# Patient Record
Sex: Female | Born: 1955 | Race: White | Hispanic: No | Marital: Married | Smoking: Current every day smoker
Health system: Southern US, Community
[De-identification: ages and names within clinical notes are randomized; demographics above are authoritative.]

## PROBLEM LIST (undated history)

## (undated) DIAGNOSIS — F329 Major depressive disorder, single episode, unspecified: Secondary | ICD-10-CM

## (undated) DIAGNOSIS — I219 Acute myocardial infarction, unspecified: Secondary | ICD-10-CM

## (undated) DIAGNOSIS — F419 Anxiety disorder, unspecified: Secondary | ICD-10-CM

## (undated) DIAGNOSIS — I5032 Chronic diastolic (congestive) heart failure: Secondary | ICD-10-CM

## (undated) DIAGNOSIS — I480 Paroxysmal atrial fibrillation: Secondary | ICD-10-CM

## (undated) DIAGNOSIS — IMO0002 Reserved for concepts with insufficient information to code with codable children: Secondary | ICD-10-CM

## (undated) DIAGNOSIS — F32A Depression, unspecified: Secondary | ICD-10-CM

## (undated) DIAGNOSIS — IMO0001 Reserved for inherently not codable concepts without codable children: Secondary | ICD-10-CM

## (undated) DIAGNOSIS — I2510 Atherosclerotic heart disease of native coronary artery without angina pectoris: Secondary | ICD-10-CM

## (undated) DIAGNOSIS — K219 Gastro-esophageal reflux disease without esophagitis: Secondary | ICD-10-CM

## (undated) DIAGNOSIS — E785 Hyperlipidemia, unspecified: Secondary | ICD-10-CM

## (undated) HISTORY — PX: EYE SURGERY: SHX253

## (undated) HISTORY — PX: APPENDECTOMY: SHX54

## (undated) HISTORY — PX: CORONARY ARTERY BYPASS GRAFT: SHX141

## (undated) HISTORY — PX: OOPHORECTOMY: SHX86

## (undated) HISTORY — PX: ROTATOR CUFF REPAIR: SHX139

## (undated) HISTORY — PX: OTHER SURGICAL HISTORY: SHX169

## (undated) HISTORY — PX: BACK SURGERY: SHX140

---

## 2000-02-11 ENCOUNTER — Ambulatory Visit: Admission: RE | Admit: 2000-02-11 | Discharge: 2000-02-11 | Payer: Self-pay | Admitting: Endocrinology

## 2010-05-02 ENCOUNTER — Encounter: Payer: Self-pay | Admitting: Orthopedic Surgery

## 2010-05-07 ENCOUNTER — Inpatient Hospital Stay (HOSPITAL_COMMUNITY)
Admission: AD | Admit: 2010-05-07 | Discharge: 2010-05-09 | Payer: Self-pay | Source: Home / Self Care | Admitting: Orthopedic Surgery

## 2010-08-26 LAB — URINALYSIS, ROUTINE W REFLEX MICROSCOPIC
Glucose, UA: NEGATIVE mg/dL
Nitrite: NEGATIVE
Protein, ur: NEGATIVE mg/dL
Urobilinogen, UA: 1 mg/dL (ref 0.0–1.0)

## 2010-08-26 LAB — COMPREHENSIVE METABOLIC PANEL
AST: 16 U/L (ref 0–37)
Albumin: 3.3 g/dL — ABNORMAL LOW (ref 3.5–5.2)
CO2: 28 mEq/L (ref 19–32)
Calcium: 9.1 mg/dL (ref 8.4–10.5)
Creatinine, Ser: 0.87 mg/dL (ref 0.4–1.2)
GFR calc Af Amer: >60 mL/min (ref >60)
GFR calc non Af Amer: >60 mL/min (ref >60)

## 2010-08-26 LAB — GLUCOSE, CAPILLARY
Glucose-Capillary: 137 mg/dL — ABNORMAL HIGH (ref 70–99)
Glucose-Capillary: 139 mg/dL — ABNORMAL HIGH (ref 70–99)
Glucose-Capillary: 267 mg/dL — ABNORMAL HIGH (ref 70–99)
Glucose-Capillary: 86 mg/dL (ref 70–99)

## 2010-08-26 LAB — DIFFERENTIAL
Eosinophils Relative: 3 % (ref 0–5)
Lymphocytes Relative: 25 % (ref 12–46)
Lymphs Abs: 3.7 10*3/uL (ref 0.7–4.0)

## 2010-08-26 LAB — APTT: aPTT: 29 seconds (ref 24–37)

## 2010-08-26 LAB — PROTIME-INR: Prothrombin Time: 13.2 seconds (ref 11.6–15.2)

## 2010-08-26 LAB — CBC
MCH: 30.3 pg (ref 26.0–34.0)
MCHC: 34.1 g/dL (ref 30.0–36.0)
Platelets: 250 10*3/uL (ref 150–400)

## 2010-08-26 LAB — SURGICAL PCR SCREEN: Staphylococcus aureus: NEGATIVE

## 2011-01-14 HISTORY — PX: OTHER SURGICAL HISTORY: SHX169

## 2011-01-15 ENCOUNTER — Other Ambulatory Visit (HOSPITAL_COMMUNITY): Payer: Self-pay

## 2011-01-19 ENCOUNTER — Other Ambulatory Visit (HOSPITAL_COMMUNITY): Payer: Self-pay

## 2011-01-21 ENCOUNTER — Ambulatory Visit (HOSPITAL_COMMUNITY)
Admission: RE | Admit: 2011-01-21 | Payer: Worker's Compensation | Source: Ambulatory Visit | Admitting: Orthopedic Surgery

## 2011-03-16 DIAGNOSIS — I2510 Atherosclerotic heart disease of native coronary artery without angina pectoris: Secondary | ICD-10-CM

## 2011-03-16 DIAGNOSIS — I219 Acute myocardial infarction, unspecified: Secondary | ICD-10-CM

## 2011-03-16 HISTORY — DX: Atherosclerotic heart disease of native coronary artery without angina pectoris: I25.10

## 2011-03-16 HISTORY — DX: Acute myocardial infarction, unspecified: I21.9

## 2011-03-16 HISTORY — PX: CARDIAC CATHETERIZATION: SHX172

## 2011-04-04 DIAGNOSIS — I2510 Atherosclerotic heart disease of native coronary artery without angina pectoris: Secondary | ICD-10-CM

## 2011-04-06 DIAGNOSIS — I2510 Atherosclerotic heart disease of native coronary artery without angina pectoris: Secondary | ICD-10-CM

## 2011-06-23 ENCOUNTER — Encounter (HOSPITAL_COMMUNITY): Payer: Self-pay | Admitting: Pharmacy Technician

## 2011-06-24 ENCOUNTER — Encounter (HOSPITAL_COMMUNITY): Payer: Self-pay

## 2011-06-24 ENCOUNTER — Encounter (HOSPITAL_COMMUNITY)
Admission: RE | Admit: 2011-06-24 | Discharge: 2011-06-24 | Disposition: A | Discharge status: Home / Self Care | Payer: Worker's Compensation | Source: Ambulatory Visit | Attending: Orthopedic Surgery | Admitting: Orthopedic Surgery

## 2011-06-24 ENCOUNTER — Ambulatory Visit (HOSPITAL_COMMUNITY)
Admission: RE | Admit: 2011-06-24 | Discharge: 2011-06-24 | Disposition: A | Discharge status: Home / Self Care | Payer: Worker's Compensation | Source: Ambulatory Visit | Attending: Surgical | Admitting: Surgical

## 2011-06-24 HISTORY — DX: Reserved for concepts with insufficient information to code with codable children: IMO0002

## 2011-06-24 HISTORY — DX: Major depressive disorder, single episode, unspecified: F32.9

## 2011-06-24 HISTORY — DX: Gastro-esophageal reflux disease without esophagitis: K21.9

## 2011-06-24 HISTORY — DX: Acute myocardial infarction, unspecified: I21.9

## 2011-06-24 HISTORY — DX: Anxiety disorder, unspecified: F41.9

## 2011-06-24 HISTORY — DX: Reserved for inherently not codable concepts without codable children: IMO0001

## 2011-06-24 HISTORY — DX: Depression, unspecified: F32.A

## 2011-06-24 LAB — COMPREHENSIVE METABOLIC PANEL
ALT: 20 U/L (ref 0–35)
AST: 17 U/L (ref 0–37)
Albumin: 3.1 g/dL — ABNORMAL LOW (ref 3.5–5.2)
Alkaline Phosphatase: 125 U/L — ABNORMAL HIGH (ref 39–117)
BUN: 13 mg/dL (ref 6–23)
CO2: 27 mEq/L (ref 19–32)
Calcium: 9.2 mg/dL (ref 8.4–10.5)
Chloride: 98 mEq/L (ref 96–112)
Creatinine, Ser: 0.74 mg/dL (ref 0.50–1.10)
GFR calc Af Amer: >90 mL/min (ref >90)
GFR calc non Af Amer: >90 mL/min (ref >90)
Glucose, Bld: 376 mg/dL — ABNORMAL HIGH (ref 70–99)
Potassium: 4.5 mEq/L (ref 3.5–5.1)
Sodium: 132 mEq/L — ABNORMAL LOW (ref 135–145)
Total Bilirubin: 0.3 mg/dL (ref 0.3–1.2)
Total Protein: 7.1 g/dL (ref 6.0–8.3)

## 2011-06-24 LAB — URINALYSIS, ROUTINE W REFLEX MICROSCOPIC
Bilirubin Urine: NEGATIVE
Glucose, UA: >1000 mg/dL — AB
Hgb urine dipstick: NEGATIVE
Ketones, ur: NEGATIVE mg/dL
Leukocytes, UA: NEGATIVE
Nitrite: NEGATIVE
Protein, ur: NEGATIVE mg/dL
Specific Gravity, Urine: 1.027 (ref 1.005–1.030)
Urobilinogen, UA: 0.2 mg/dL (ref 0.0–1.0)
pH: 5.5 (ref 5.0–8.0)

## 2011-06-24 LAB — URINE MICROSCOPIC-ADD ON

## 2011-06-24 LAB — CBC
HCT: 35.1 % — ABNORMAL LOW (ref 36.0–46.0)
Hemoglobin: 11.1 g/dL — ABNORMAL LOW (ref 12.0–15.0)
MCH: 26.1 pg (ref 26.0–34.0)
MCHC: 31.6 g/dL (ref 30.0–36.0)
MCV: 82.6 fL (ref 78.0–100.0)
Platelets: 337 10*3/uL (ref 150–400)
RBC: 4.25 MIL/uL (ref 3.87–5.11)
RDW: 15.8 % — ABNORMAL HIGH (ref 11.5–15.5)
WBC: 12.3 10*3/uL — ABNORMAL HIGH (ref 4.0–10.5)

## 2011-06-24 LAB — DIFFERENTIAL
Basophils Absolute: 0.1 10*3/uL (ref 0.0–0.1)
Basophils Relative: 1 % (ref 0–1)
Eosinophils Absolute: 1 10*3/uL — ABNORMAL HIGH (ref 0.0–0.7)
Eosinophils Relative: 8 % — ABNORMAL HIGH (ref 0–5)
Lymphocytes Relative: 26 % (ref 12–46)
Lymphs Abs: 3.2 10*3/uL (ref 0.7–4.0)
Monocytes Absolute: 0.7 10*3/uL (ref 0.1–1.0)
Monocytes Relative: 6 % (ref 3–12)
Neutro Abs: 7.3 10*3/uL (ref 1.7–7.7)
Neutrophils Relative %: 59 % (ref 43–77)

## 2011-06-24 LAB — ABO/RH: ABO/RH(D): O NEG

## 2011-06-24 LAB — TYPE AND SCREEN
ABO/RH(D): O NEG
Antibody Screen: NEGATIVE

## 2011-06-24 LAB — SURGICAL PCR SCREEN: Staphylococcus aureus: NEGATIVE

## 2011-06-24 LAB — PROTIME-INR
INR: 1.08 (ref 0.00–1.49)
Prothrombin Time: 14.2 seconds (ref 11.6–15.2)

## 2011-06-24 LAB — APTT: aPTT: 33 seconds (ref 24–37)

## 2011-06-24 NOTE — Patient Instructions (Addendum)
20 ELEXIA FRIEDT  06/24/2011   Your procedure is scheduled on:  06/25/11  Report to [facility] at 1:45 pm  Call this number if you have problems the morning of surgery: [phone]   Remember:   Do not eat food:After Midnight.  May have clear liquids: up to 4 Hours before arrival. (9:45 am)  Clear liquids include soda, tea, black coffee, apple or grape juice, broth.  Take these medicines the morning of surgery with A SIP OF WATER: xanax / wellbutrin / coreg / prozac / neurontin / protonix / norco if needed / take 1/2 dose of lantus insulin the night before surgery   Do not wear jewelry, make-up or nail polish.  Do not wear lotions, powders, or perfumes. You may wear deodorant.  Do not shave 48 hours prior to surgery.  Do not bring valuables to the hospital.  Contacts, dentures or bridgework may not be worn into surgery.  Leave suitcase in the car. After surgery it may be brought to your room.  For patients admitted to the hospital, checkout time is 11:00 AM the day of discharge.   Patients discharged the day of surgery will not be allowed to drive home.  Name and phone number of your driver:   Special Instructions: CHG Shower Use Special Wash: 1/2 bottle night before surgery and 1/2 bottle morning of surgery.   Please read over the following fact sheets that you were given: MRSA Information

## 2011-06-24 NOTE — H&P (Signed)
Micheal Likens DOB: 1955-08-04  Chief Complaint: Back pain  History of Present Illness The patient is a 56 year old female who comes in today for a preoperative History and Physical. The patient is scheduled for a right Lumbar Hemilaminectomy and microdiscectomy L5-S1 to be performed by Dr. Georges Lynch. Darrelyn Hillock, MD at [facility] on Thursday June 25, 2011 . 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 She has been cleared for surgery by Dr. Josiah Lobo at [facility]. Patient has right lower back pain radiating into the right leg, with numbness and tingling. She had an injury in July 2011, when she fell down the stairs at work. MRI showed mass effect at S1, right, and severe disc degeneration at L5-S1. She has had several medical problems since then that have postponed surgery.   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 02/04/2010 Pain in joint, shoulder (719.41). 02/04/2010 Lumbago (724.2). 02/04/2010 Sprain/strain, shoulder/arm NOS (840.9). 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 02/04/2010 Pain in joint, shoulder (719.41). 03/03/2010 Sprain/strain, shoulder/arm NEC (840.8). 09/02/2010 Depression Hypertension Diabetes Mellitus, Type II Anxiety Disorder Coronary Artery Disease Coronary Artery Bypass, Four Myocardial infarction  Allergies CODEINE (not the derivatives) SULFA TRAMADOL. 10/30/2010 SENSITIVITY  Family History Heart Disease Cancer  Social History Tobacco use. stopped smoking October 2012 Alcohol use. Occasional alcohol use.  Past Surgical History Hysterectomy (not due to cancer) - Partial Rotator Cuff Repair - Both Heart Stents Low Back Disc Surgery Coronary Artery Bypass, Four   Review of Systems General:Not Present- Chills, Fever, Night Sweats, Fatigue, Weight Gain, Weight  Loss and Memory Loss. Skin:Not Present- Hives, Itching, Rash, Eczema and Lesions. HEENT:Not Present- Tinnitus, Headache, Double Vision, Visual Loss, Hearing Loss and Dentures. Respiratory:Not Present- Shortness of breath with exertion, Shortness of breath at rest, Allergies, Coughing up blood and Chronic Cough. Cardiovascular:Not Present- Chest Pain, Racing/skipping heartbeats, Difficulty Breathing Lying Down, Murmur, Swelling and Palpitations. Gastrointestinal:Not Present- Bloody Stool, Heartburn, Abdominal Pain, Vomiting, Nausea, Constipation, Diarrhea, Difficulty Swallowing, Jaundice and Loss of appetitie. Female Genitourinary:Not Present- Blood in Urine, Urinary frequency, Weak urinary stream, Discharge, Flank Pain, Incontinence, Painful Urination, Urgency, Urinary Retention and Urinating at Night. Musculoskeletal:Present- Muscle Weakness, Muscle Pain and Back Pain. Not Present- Joint Swelling, Joint Pain, Morning Stiffness and Spasms. Neurological:Present- Tingling and Numbness. Not Present- Tremor, Dizziness, Blackout spells, Paralysis, Difficulty with balance and Weakness. Psychiatric:Not Present- Insomnia.   Vitals 06/23/2011 1:35 PM Weight: 220 lb Height: 64 in Body Surface Area: 2.12 m Body Mass Index: 37.76 kg/m Pulse: 105 (Regular) Resp.: 18 (Unlabored) BP: 147/84 (Sitting, Left Arm, Standard)  Physical Exam  General Mental Status - Alert, cooperative and good historian. General Appearance- pleasant. Not in acute distress. Orientation- Oriented X3. Build & Nutrition- Well nourished and Well developed. Integumentary Incision scar over the sternum from her CABG in Oct 2012. Head and Neck Head- normocephalic, atraumatic . NeckGlobal Assessment- supple. no bruit auscultated on the right and no bruit auscultated on the left. Eye Pupil- Bilateral- PERRLA. Motion- Bilateral- EOMI. Chest and Lung Exam Auscultation: Breath sounds:- clear at  anterior chest wall and - clear at posterior chest wall. Adventitious sounds:- No Adventitious sounds. Cardiovascular Auscultation:Rhythm- Regular rate and rhythm. Heart Sounds- S1 WNL and S2 WNL. Murmurs & Other Heart Sounds:Auscultation of the heart reveals - No Murmurs. Abdomen Palpation/Percussion:Tenderness- Abdomen is non-tender to palpation. Rigidity (guarding)- Abdomen is soft. Auscultation:Auscultation of the abdomen reveals - Bowel sounds normal. Female  Genitourinary Not done, not pertinent to present illness Peripheral Vascular Lower Extremity: Palpation:- Pulses bilaterally normal. Neurologic Cranial Nerves:- Normal Bilaterally. Sensory:- Normal. Overall Assessment of Muscle Strength and Tone reveals: Lower Extremities:Right Quadriceps- 3/5. Left Quadriceps- 5/5. General Assessment of Reflexes: Right Knee- 2+. Left Knee- 2+. Partial foot drop right side. Straight leg raise positive on right. Negative on left. Musculoskeletal Hips and knees have full painless range of motion.    Radiographs Previous MRI performed on 12/22/2010 shows L5-S1 severe disc degeneration with loss of disc height, moderate disc bulging and left laminotomy. Mild mass effect on the S1 roots right more than left.  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 We do use antibiotics prior to the surgery and during surgery and after surgery. Number two is always a slight degree of probability that you could develop a blood clot in your leg after any type of surgery and we try our best to prevent that with aspirin post op when it is safe to begin. The third is a dural leak. That is the spinal fluid leak that could occur. At certain rare times the bone or the disc could literally stick to the dura which is the lining  which contains the spinal fluid and we could develop a small tear in that lining which we then patch up. That is an extremely rare complication. The last and final complication is a recurrent disc rupture. That means that you could rupture another small piece of disc later on down the road and there is about a 2% chance of that.  Dimitri Ped, PA-C

## 2011-06-25 ENCOUNTER — Inpatient Hospital Stay (HOSPITAL_COMMUNITY): Payer: Worker's Compensation

## 2011-06-25 ENCOUNTER — Inpatient Hospital Stay (HOSPITAL_COMMUNITY): Payer: Worker's Compensation | Admitting: Anesthesiology

## 2011-06-25 ENCOUNTER — Encounter (HOSPITAL_COMMUNITY): Payer: Self-pay | Admitting: Anesthesiology

## 2011-06-25 ENCOUNTER — Encounter (HOSPITAL_COMMUNITY)
Admission: RE | Disposition: A | Discharge status: Home / Self Care | Payer: Self-pay | Source: Ambulatory Visit | Attending: Orthopedic Surgery

## 2011-06-25 ENCOUNTER — Inpatient Hospital Stay (HOSPITAL_COMMUNITY)
Admission: RE | Admit: 2011-06-25 | Discharge: 2011-06-28 | DRG: 491 | Disposition: A | Discharge status: Home / Self Care | Payer: Worker's Compensation | Source: Ambulatory Visit | Attending: Orthopedic Surgery | Admitting: Orthopedic Surgery

## 2011-06-25 DIAGNOSIS — M48061 Spinal stenosis, lumbar region without neurogenic claudication: Principal | ICD-10-CM | POA: Diagnosis present

## 2011-06-25 DIAGNOSIS — M4807 Spinal stenosis, lumbosacral region: Secondary | ICD-10-CM | POA: Diagnosis present

## 2011-06-25 DIAGNOSIS — M5126 Other intervertebral disc displacement, lumbar region: Secondary | ICD-10-CM | POA: Diagnosis present

## 2011-06-25 DIAGNOSIS — F411 Generalized anxiety disorder: Secondary | ICD-10-CM | POA: Diagnosis present

## 2011-06-25 DIAGNOSIS — Z951 Presence of aortocoronary bypass graft: Secondary | ICD-10-CM

## 2011-06-25 DIAGNOSIS — I252 Old myocardial infarction: Secondary | ICD-10-CM

## 2011-06-25 DIAGNOSIS — Z87891 Personal history of nicotine dependence: Secondary | ICD-10-CM

## 2011-06-25 DIAGNOSIS — F329 Major depressive disorder, single episode, unspecified: Secondary | ICD-10-CM | POA: Diagnosis present

## 2011-06-25 DIAGNOSIS — I10 Essential (primary) hypertension: Secondary | ICD-10-CM | POA: Diagnosis present

## 2011-06-25 DIAGNOSIS — F3289 Other specified depressive episodes: Secondary | ICD-10-CM | POA: Diagnosis present

## 2011-06-25 DIAGNOSIS — I2510 Atherosclerotic heart disease of native coronary artery without angina pectoris: Secondary | ICD-10-CM | POA: Diagnosis present

## 2011-06-25 DIAGNOSIS — E119 Type 2 diabetes mellitus without complications: Secondary | ICD-10-CM | POA: Diagnosis present

## 2011-06-25 HISTORY — PX: LUMBAR LAMINECTOMY/DECOMPRESSION MICRODISCECTOMY: SHX5026

## 2011-06-25 LAB — GLUCOSE, CAPILLARY
Glucose-Capillary: 171 mg/dL — ABNORMAL HIGH (ref 70–99)
Glucose-Capillary: 132 mg/dL — ABNORMAL HIGH (ref 70–99)
Glucose-Capillary: 126 mg/dL — ABNORMAL HIGH (ref 70–99)

## 2011-06-25 SURGERY — LUMBAR LAMINECTOMY/DECOMPRESSION MICRODISCECTOMY
Anesthesia: General | Site: Back | Laterality: Right | Wound class: Clean

## 2011-06-25 MED ORDER — CARVEDILOL 3.125 MG PO TABS
3.1250 mg | ORAL_TABLET | Freq: Two times a day (BID) | ORAL | Status: AC
  Administered 2011-06-25: 3.125 mg via ORAL
  Filled 2011-06-25: qty 1

## 2011-06-25 MED ORDER — LACTATED RINGERS IV SOLN
INTRAVENOUS | Status: DC
  Administered 2011-06-25 – 2011-06-28 (×5): via INTRAVENOUS

## 2011-06-25 MED ORDER — PANTOPRAZOLE SODIUM 20 MG PO TBEC
20.0000 mg | DELAYED_RELEASE_TABLET | Freq: Every day | ORAL | Status: DC
  Administered 2011-06-25 – 2011-06-28 (×4): 20 mg via ORAL
  Filled 2011-06-25 (×4): qty 1

## 2011-06-25 MED ORDER — PRASUGREL HCL 10 MG PO TABS
10.0000 mg | ORAL_TABLET | Freq: Every day | ORAL | Status: DC
  Administered 2011-06-26 – 2011-06-28 (×3): 10 mg via ORAL
  Filled 2011-06-25 (×3): qty 1

## 2011-06-25 MED ORDER — METHOCARBAMOL 500 MG PO TABS
500.0000 mg | ORAL_TABLET | Freq: Four times a day (QID) | ORAL | Status: DC | PRN
  Administered 2011-06-26 – 2011-06-28 (×5): 500 mg via ORAL
  Filled 2011-06-25 (×5): qty 1

## 2011-06-25 MED ORDER — BACITRACIN-NEOMYCIN-POLYMYXIN 400-5-5000 EX OINT
TOPICAL_OINTMENT | CUTANEOUS | Status: DC | PRN
  Administered 2011-06-25: 1 via TOPICAL

## 2011-06-25 MED ORDER — PROMETHAZINE HCL 25 MG/ML IJ SOLN: 6.2500 mg | INTRAMUSCULAR | Status: DC | PRN

## 2011-06-25 MED ORDER — PHENYLEPHRINE HCL 10 MG/ML IJ SOLN
10.0000 mg | INTRAVENOUS | Status: DC | PRN
  Administered 2011-06-25: 50 ug/min via INTRAVENOUS

## 2011-06-25 MED ORDER — THROMBIN 5000 UNITS EX SOLR
CUTANEOUS | Status: AC
  Filled 2011-06-25: qty 10000

## 2011-06-25 MED ORDER — PHENOL 1.4 % MT LIQD
1.0000 | OROMUCOSAL | Status: DC | PRN
  Filled 2011-06-25: qty 177

## 2011-06-25 MED ORDER — CISATRACURIUM BESYLATE 2 MG/ML IV SOLN
INTRAVENOUS | Status: DC | PRN
  Administered 2011-06-25: 10 mg via INTRAVENOUS
  Administered 2011-06-25: 3 mg via INTRAVENOUS
  Administered 2011-06-25: 2 mg via INTRAVENOUS

## 2011-06-25 MED ORDER — INSULIN ASPART 100 UNIT/ML ~~LOC~~ SOLN
0.0000 [IU] | Freq: Three times a day (TID) | SUBCUTANEOUS | Status: DC
  Administered 2011-06-26: 3 [IU] via SUBCUTANEOUS
  Administered 2011-06-26: 5 [IU] via SUBCUTANEOUS
  Administered 2011-06-27: 2 [IU] via SUBCUTANEOUS
  Administered 2011-06-27 – 2011-06-28 (×3): 3 [IU] via SUBCUTANEOUS
  Filled 2011-06-25: qty 3

## 2011-06-25 MED ORDER — SUFENTANIL CITRATE 50 MCG/ML IV SOLN
INTRAVENOUS | Status: DC | PRN
  Administered 2011-06-25: 15 ug via INTRAVENOUS
  Administered 2011-06-25: 5 ug via INTRAVENOUS
  Administered 2011-06-25 (×3): 10 ug via INTRAVENOUS

## 2011-06-25 MED ORDER — NITROGLYCERIN 0.4 MG SL SUBL: 0.4000 mg | SUBLINGUAL_TABLET | SUBLINGUAL | Status: DC | PRN

## 2011-06-25 MED ORDER — FLUOXETINE HCL 20 MG PO CAPS
40.0000 mg | ORAL_CAPSULE | Freq: Two times a day (BID) | ORAL | Status: DC
  Administered 2011-06-25 – 2011-06-28 (×6): 40 mg via ORAL
  Filled 2011-06-25 (×7): qty 2

## 2011-06-25 MED ORDER — CEFAZOLIN SODIUM-DEXTROSE 2-3 GM-% IV SOLR
2.0000 g | Freq: Once | INTRAVENOUS | Status: AC
  Administered 2011-06-25: 2 g via INTRAVENOUS

## 2011-06-25 MED ORDER — LACTATED RINGERS IV SOLN
INTRAVENOUS | Status: DC
  Administered 2011-06-25 (×2): via INTRAVENOUS

## 2011-06-25 MED ORDER — ACETAMINOPHEN 10 MG/ML IV SOLN
INTRAVENOUS | Status: AC
  Filled 2011-06-25: qty 100

## 2011-06-25 MED ORDER — GLIPIZIDE 5 MG PO TABS
5.0000 mg | ORAL_TABLET | Freq: Two times a day (BID) | ORAL | Status: DC
  Administered 2011-06-26 – 2011-06-28 (×5): 5 mg via ORAL
  Filled 2011-06-25 (×6): qty 1

## 2011-06-25 MED ORDER — BACITRACIN ZINC 500 UNIT/GM EX OINT
TOPICAL_OINTMENT | CUTANEOUS | Status: AC
  Filled 2011-06-25: qty 15

## 2011-06-25 MED ORDER — BUPIVACAINE LIPOSOME 1.3 % IJ SUSP
INTRAMUSCULAR | Status: DC | PRN
  Administered 2011-06-25: 20 mL

## 2011-06-25 MED ORDER — MIDAZOLAM HCL 5 MG/5ML IJ SOLN
INTRAMUSCULAR | Status: DC | PRN
  Administered 2011-06-25: 1 mg via INTRAVENOUS

## 2011-06-25 MED ORDER — CEFAZOLIN SODIUM-DEXTROSE 2-3 GM-% IV SOLR
INTRAVENOUS | Status: AC
  Filled 2011-06-25: qty 50

## 2011-06-25 MED ORDER — GLIPIZIDE-METFORMIN HCL 5-500 MG PO TABS: 1.0000 | ORAL_TABLET | Freq: Two times a day (BID) | ORAL | Status: DC

## 2011-06-25 MED ORDER — METFORMIN HCL 500 MG PO TABS
500.0000 mg | ORAL_TABLET | Freq: Two times a day (BID) | ORAL | Status: DC
  Administered 2011-06-26 – 2011-06-28 (×5): 500 mg via ORAL
  Filled 2011-06-25 (×6): qty 1

## 2011-06-25 MED ORDER — BUPIVACAINE LIPOSOME 1.3 % IJ SUSP
20.0000 mL | Freq: Once | INTRAMUSCULAR | Status: DC
  Filled 2011-06-25: qty 20

## 2011-06-25 MED ORDER — BUPROPION HCL ER (SR) 150 MG PO TB12
150.0000 mg | ORAL_TABLET | Freq: Two times a day (BID) | ORAL | Status: DC
  Administered 2011-06-25 – 2011-06-28 (×6): 150 mg via ORAL
  Filled 2011-06-25 (×7): qty 1

## 2011-06-25 MED ORDER — ACETAMINOPHEN 10 MG/ML IV SOLN
INTRAVENOUS | Status: DC | PRN
  Administered 2011-06-25: 1000 mg via INTRAVENOUS

## 2011-06-25 MED ORDER — LIDOCAINE HCL (CARDIAC) 20 MG/ML IV SOLN
INTRAVENOUS | Status: DC | PRN
  Administered 2011-06-25: 75 mg via INTRAVENOUS

## 2011-06-25 MED ORDER — ONDANSETRON HCL 4 MG/2ML IJ SOLN: 4.0000 mg | INTRAMUSCULAR | Status: DC | PRN

## 2011-06-25 MED ORDER — LACTATED RINGERS IV SOLN: INTRAVENOUS | Status: DC

## 2011-06-25 MED ORDER — PROPOFOL 10 MG/ML IV EMUL
INTRAVENOUS | Status: DC | PRN
  Administered 2011-06-25: 130 mg via INTRAVENOUS

## 2011-06-25 MED ORDER — NEOSTIGMINE METHYLSULFATE 1 MG/ML IJ SOLN
INTRAMUSCULAR | Status: DC | PRN
  Administered 2011-06-25: 4 mg via INTRAVENOUS

## 2011-06-25 MED ORDER — GLYCOPYRROLATE 0.2 MG/ML IJ SOLN
INTRAMUSCULAR | Status: DC | PRN
  Administered 2011-06-25: .6 mg via INTRAVENOUS

## 2011-06-25 MED ORDER — THROMBIN 5000 UNITS EX SOLR
CUTANEOUS | Status: DC | PRN
  Administered 2011-06-25: 5000 [IU] via TOPICAL

## 2011-06-25 MED ORDER — HYDROMORPHONE HCL PF 1 MG/ML IJ SOLN
0.5000 mg | INTRAMUSCULAR | Status: DC | PRN
  Administered 2011-06-25 – 2011-06-26 (×7): 1 mg via INTRAVENOUS
  Administered 2011-06-26 – 2011-06-27 (×2): 0.5 mg via INTRAVENOUS
  Filled 2011-06-25 (×10): qty 1

## 2011-06-25 MED ORDER — MENTHOL 3 MG MT LOZG
1.0000 | LOZENGE | OROMUCOSAL | Status: DC | PRN
  Filled 2011-06-25: qty 9

## 2011-06-25 MED ORDER — SIMVASTATIN 20 MG PO TABS
20.0000 mg | ORAL_TABLET | Freq: Every day | ORAL | Status: DC
  Administered 2011-06-25 – 2011-06-28 (×4): 20 mg via ORAL
  Filled 2011-06-25 (×4): qty 1

## 2011-06-25 MED ORDER — ONDANSETRON HCL 4 MG/2ML IJ SOLN
INTRAMUSCULAR | Status: DC | PRN
  Administered 2011-06-25: 4 mg via INTRAVENOUS

## 2011-06-25 MED ORDER — HYDROMORPHONE HCL 2 MG PO TABS
2.0000 mg | ORAL_TABLET | ORAL | Status: DC | PRN
  Administered 2011-06-26 – 2011-06-28 (×10): 2 mg via ORAL
  Filled 2011-06-25 (×10): qty 1

## 2011-06-25 MED ORDER — METHOCARBAMOL 100 MG/ML IJ SOLN
500.0000 mg | Freq: Four times a day (QID) | INTRAVENOUS | Status: DC | PRN
  Administered 2011-06-25: 500 mg via INTRAVENOUS
  Filled 2011-06-25: qty 5

## 2011-06-25 MED ORDER — CEFAZOLIN SODIUM 1-5 GM-% IV SOLN
1.0000 g | Freq: Three times a day (TID) | INTRAVENOUS | Status: AC
  Administered 2011-06-25 – 2011-06-26 (×3): 1 g via INTRAVENOUS
  Filled 2011-06-25 (×4): qty 50

## 2011-06-25 MED ORDER — ACETAMINOPHEN 10 MG/ML IV SOLN
1000.0000 mg | Freq: Four times a day (QID) | INTRAVENOUS | Status: AC
  Administered 2011-06-25 – 2011-06-26 (×3): 1000 mg via INTRAVENOUS
  Filled 2011-06-25 (×4): qty 100

## 2011-06-25 MED ORDER — SODIUM CHLORIDE 0.9 % IR SOLN
Status: DC | PRN
  Administered 2011-06-25: 17:00:00

## 2011-06-25 MED ORDER — ALPRAZOLAM 1 MG PO TABS
1.0000 mg | ORAL_TABLET | Freq: Two times a day (BID) | ORAL | Status: DC | PRN
  Administered 2011-06-27: 1 mg via ORAL
  Filled 2011-06-25: qty 1

## 2011-06-25 MED ORDER — FENTANYL CITRATE 0.05 MG/ML IJ SOLN
25.0000 ug | INTRAMUSCULAR | Status: DC | PRN
  Administered 2011-06-25: 50 ug via INTRAVENOUS
  Administered 2011-06-25 (×2): 25 ug via INTRAVENOUS

## 2011-06-25 MED ORDER — GABAPENTIN 300 MG PO CAPS
300.0000 mg | ORAL_CAPSULE | Freq: Three times a day (TID) | ORAL | Status: DC
  Administered 2011-06-25 – 2011-06-28 (×8): 300 mg via ORAL
  Filled 2011-06-25 (×10): qty 1

## 2011-06-25 MED ORDER — CARVEDILOL 3.125 MG PO TABS
3.1250 mg | ORAL_TABLET | Freq: Two times a day (BID) | ORAL | Status: DC
  Administered 2011-06-26 – 2011-06-28 (×5): 3.125 mg via ORAL
  Filled 2011-06-25 (×6): qty 1

## 2011-06-25 SURGICAL SUPPLY — 46 items
APL SKNCLS STERI-STRIP NONHPOA (GAUZE/BANDAGES/DRESSINGS) ×1
BAG SPEC THK2 15X12 ZIP CLS (MISCELLANEOUS) ×1
BAG ZIPLOCK 12X15 (MISCELLANEOUS) ×2 IMPLANT
BENZOIN TINCTURE PRP APPL 2/3 (GAUZE/BANDAGES/DRESSINGS) ×2 IMPLANT
CLEANER TIP ELECTROSURG 2X2 (MISCELLANEOUS) ×2 IMPLANT
CLOTH BEACON ORANGE TIMEOUT ST (SAFETY) ×2 IMPLANT
CONT SPECI 4OZ STER CLIK (MISCELLANEOUS) ×2 IMPLANT
DRAIN PENROSE 18X1/4 LTX STRL (WOUND CARE) IMPLANT
DRAPE MICROSCOPE LEICA (MISCELLANEOUS) ×2 IMPLANT
DRAPE POUCH INSTRU U-SHP 10X18 (DRAPES) ×2 IMPLANT
DRAPE SURG 17X11 SM STRL (DRAPES) ×2 IMPLANT
DRSG ADAPTIC 3X8 NADH LF (GAUZE/BANDAGES/DRESSINGS) ×2 IMPLANT
DRSG EMULSION OIL 3X16 NADH (GAUZE/BANDAGES/DRESSINGS) ×1 IMPLANT
DRSG PAD ABDOMINAL 8X10 ST (GAUZE/BANDAGES/DRESSINGS) ×2 IMPLANT
DURAPREP 26ML APPLICATOR (WOUND CARE) ×2 IMPLANT
ELECT REM PT RETURN 9FT ADLT (ELECTROSURGICAL) ×2
ELECTRODE REM PT RTRN 9FT ADLT (ELECTROSURGICAL) ×1 IMPLANT
GAUZE SPONGE 4X4 12PLY STRL LF (GAUZE/BANDAGES/DRESSINGS) ×1 IMPLANT
GLOVE BIOGEL PI IND STRL 8.5 (GLOVE) ×1 IMPLANT
GLOVE BIOGEL PI INDICATOR 8.5 (GLOVE) ×1
GLOVE ECLIPSE 8.0 STRL XLNG CF (GLOVE) ×2 IMPLANT
GOWN PREVENTION PLUS LG XLONG (DISPOSABLE) ×4 IMPLANT
GOWN STRL REIN XL XLG (GOWN DISPOSABLE) ×4 IMPLANT
KIT BASIN OR (CUSTOM PROCEDURE TRAY) ×2 IMPLANT
KIT POSITIONING SURG ANDREWS (MISCELLANEOUS) ×2 IMPLANT
MANIFOLD NEPTUNE II (INSTRUMENTS) ×2 IMPLANT
NDL SPNL 18GX3.5 QUINCKE PK (NEEDLE) ×2 IMPLANT
NEEDLE SPNL 18GX3.5 QUINCKE PK (NEEDLE) ×4 IMPLANT
NS IRRIG 1000ML POUR BTL (IV SOLUTION) ×2 IMPLANT
PATTIES SURGICAL .5 X.5 (GAUZE/BANDAGES/DRESSINGS) IMPLANT
PATTIES SURGICAL .75X.75 (GAUZE/BANDAGES/DRESSINGS) IMPLANT
PATTIES SURGICAL 1X1 (DISPOSABLE) IMPLANT
PIN SAFETY NICK PLATE  2 MED (MISCELLANEOUS)
PIN SAFETY NICK PLATE 2 MED (MISCELLANEOUS) IMPLANT
POSITIONER SURGICAL ARM (MISCELLANEOUS) ×2 IMPLANT
SPONGE LAP 4X18 X RAY DECT (DISPOSABLE) ×4 IMPLANT
SPONGE SURGIFOAM ABS GEL 100 (HEMOSTASIS) ×2 IMPLANT
STAPLER VISISTAT 35W (STAPLE) IMPLANT
SUT VIC AB 0 CT1 27 (SUTURE) ×2
SUT VIC AB 0 CT1 27XBRD ANTBC (SUTURE) ×1 IMPLANT
SUT VIC AB 1 CT1 27 (SUTURE) ×8
SUT VIC AB 1 CT1 27XBRD ANTBC (SUTURE) ×4 IMPLANT
TAPE HYPAFIX 4 X10 (GAUZE/BANDAGES/DRESSINGS) ×1 IMPLANT
TOWEL OR 17X26 10 PK STRL BLUE (TOWEL DISPOSABLE) ×4 IMPLANT
TRAY LAMINECTOMY (CUSTOM PROCEDURE TRAY) ×2 IMPLANT
WATER STERILE IRR 1500ML POUR (IV SOLUTION) ×2 IMPLANT

## 2011-06-25 NOTE — Transfer of Care (Signed)
Immediate Anesthesia Transfer of Care Note  Patient: Mindy Love  Procedure(s) Performed:  LUMBAR LAMINECTOMY/DECOMPRESSION MICRODISCECTOMY - Lumbar Hemi Laminectomy L5 - S1 on the Right/Microdiscectomy on the Right  (X-Ray  Patient Location: PACU  Anesthesia Type: General  Level of Consciousness: awake, alert  and oriented  Airway & Oxygen Therapy: Patient Spontanous Breathing and Patient connected to face mask oxygen  Post-op Assessment: Report given to PACU RN and Post -op Vital signs reviewed and stable  Post vital signs: Reviewed and stable Filed Vitals:   06/25/11 1425  BP: 100/70  Pulse: 88  Temp:   Resp:     Complications: No apparent anesthesia complications

## 2011-06-25 NOTE — Interval H&P Note (Signed)
History and Physical Interval Note:  06/25/2011 3:01 PM  Mindy Love  has presented today for surgery, with the diagnosis of herniated disc  The various methods of treatment have been discussed with the patient and family. After consideration of risks, benefits and other options for treatment, the patient has consented to  Procedure(s): LUMBAR LAMINECTOMY/DECOMPRESSION MICRODISCECTOMY as a surgical intervention .  The patients' history has been reviewed, patient examined, no change in status, stable for surgery.  I have reviewed the patients' chart and labs.  Questions were answered to the patient's satisfaction.     Mica Ramdass A

## 2011-06-25 NOTE — Preoperative (Signed)
Beta Blockers   Reason not to administer Beta Blockers:Not Applicable 

## 2011-06-25 NOTE — Brief Op Note (Signed)
06/25/2011  5:58 PM  PATIENT:  Mindy Love  55 y.o. female  PRE-OPERATIVE DIAGNOSIS:  herniated disc  POST-OPERATIVE DIAGNOSIS:  herniated disc  PROCEDURE:  Procedure(s): LUMBAR LAMINECTOMY/DECOMPRESSION MICRODISCECTOMY  SURGEON:  Surgeon(s): Windy Fast A Virda Betters James P Aplington  PHYSICIAN ASSISTANT:   ASSISTANTS: `Dr. Fayrene Fearing Aplington   ANESTHESIA:   general  EBL:  Total I/O In: 1000 [I.V.:1000] Out: 125 [Blood:125]  BLOOD ADMINISTERED:none  DRAINS: none   LOCAL MEDICATIONS USED:  BUPIVICAINE 20CC mixed with 40cc Normal Saline SPECIMEN:  Source of Specimen:  L-5--S-1 Interspace  DISPOSITION OF SPECIMEN:  PATHOLOGY  COUNTS:  YES  TOURNIQUET:  * No tourniquets in log *  DICTATION: .Other Dictation: Dictation Number [id number]  PLAN OF CARE: Admit for overnight observation  PATIENT DISPOSITION:  PACU - hemodynamically stable.

## 2011-06-25 NOTE — OR Nursing (Signed)
During Nursing PACU admission assessment, heart murmur noted at [facility], approx 2 IC space, MDA aware, Hx reviewed

## 2011-06-25 NOTE — Anesthesia Postprocedure Evaluation (Signed)
  Anesthesia Post-op Note  Patient: Mindy Love  Procedure(s) Performed:  LUMBAR LAMINECTOMY/DECOMPRESSION MICRODISCECTOMY - Lumbar Hemi Laminectomy L5 - S1 on the Right/Microdiscectomy on the Right  (X-Ray  Patient Location: PACU  Anesthesia Type: General  Level of Consciousness: awake and alert   Airway and Oxygen Therapy: Patient Spontanous Breathing  Post-op Pain: mild  Post-op Assessment: Post-op Vital signs reviewed, Patient's Cardiovascular Status Stable, Respiratory Function Stable, Patent Airway and No signs of Nausea or vomiting  Post-op Vital Signs: stable  Complications: No apparent anesthesia complications

## 2011-06-25 NOTE — Anesthesia Preprocedure Evaluation (Signed)
Anesthesia Evaluation  Patient identified by MRN, date of birth, ID band Patient awake    Reviewed: Allergy & Precautions, H&P , NPO status , Patient's Chart, lab work & pertinent test results  History of Anesthesia Complications Negative for: history of anesthetic complications  Airway Mallampati: II TM Distance: >3 FB     Dental  (+) Edentulous Upper and Edentulous Lower   Pulmonary former smoker (Discontinued 10/12 post MI) clear to auscultation  Pulmonary exam normal       Cardiovascular hypertension, + CAD, + Past MI, + Cardiac Stents (Cardiac stents x5 prior to MI/CABG) and + CABG Regular Normal    Neuro/Psych Anxiety Depression Negative Neurological ROS  Negative Psych ROS   GI/Hepatic Neg liver ROS, GERD-  Medicated,  Endo/Other  Negative Endocrine ROSDiabetes mellitus-, Well Controlled, Type 2, Insulin Dependent and Oral Hypoglycemic Agents  Renal/GU negative Renal ROS  Genitourinary negative   Musculoskeletal negative musculoskeletal ROS (+)   Abdominal (+) obese,   Peds negative pediatric ROS (+)  Hematology negative hematology ROS (+)   Anesthesia Other Findings   Reproductive/Obstetrics negative OB ROS                           Anesthesia Physical Anesthesia Plan  ASA: III  Anesthesia Plan: General   Post-op Pain Management:    Induction: Intravenous  Airway Management Planned: Oral ETT  Additional Equipment:   Intra-op Plan:   Post-operative Plan: Extubation in OR  Informed Consent: I have reviewed the patients History and Physical, chart, labs and discussed the procedure including the risks, benefits and alternatives for the proposed anesthesia with the patient or authorized representative who has indicated his/her understanding and acceptance.   Dental advisory given  Plan Discussed with: CRNA  Anesthesia Plan Comments: (Risk and benefits of proposed  anesthetic plan discussed and all questions answered. Patient at moderate risk for cardiac event and is aware and elects to proceed.)        Anesthesia Quick Evaluation

## 2011-06-25 NOTE — Anesthesia Procedure Notes (Signed)
Procedure Name: Intubation Date/Time: 06/25/2011 3:19 PM Performed by: Lurlean Leyden, Tiffiney Sparrow L. Patient Re-evaluated:Patient Re-evaluated prior to inductionOxygen Delivery Method: Circle System Utilized Preoxygenation: Pre-oxygenation with 100% oxygen Intubation Type: IV induction Ventilation: Oral airway inserted - appropriate to patient size and Mask ventilation without difficulty Laryngoscope Size: Miller and 2 Grade View: Grade I Tube type: Oral Tube size: 7.5 mm Number of attempts: 1 Airway Equipment and Method: stylet Placement Confirmation: ETT inserted through vocal cords under direct vision,  breath sounds checked- equal and bilateral and positive ETCO2 Secured at: 22 cm Tube secured with: Tape Dental Injury: Teeth and Oropharynx as per pre-operative assessment

## 2011-06-26 ENCOUNTER — Other Ambulatory Visit: Payer: Self-pay | Admitting: Orthopedic Surgery

## 2011-06-26 DIAGNOSIS — M4807 Spinal stenosis, lumbosacral region: Secondary | ICD-10-CM | POA: Diagnosis present

## 2011-06-26 LAB — GLUCOSE, CAPILLARY
Glucose-Capillary: 207 mg/dL — ABNORMAL HIGH (ref 70–99)
Glucose-Capillary: 245 mg/dL — ABNORMAL HIGH (ref 70–99)
Glucose-Capillary: 113 mg/dL — ABNORMAL HIGH (ref 70–99)

## 2011-06-26 NOTE — Progress Notes (Signed)
  CARE MANAGEMENT NOTE 06/26/2011  Patient:  Mindy Love, Mindy Love   Account Number:  [id number]  Date Initiated:  06/26/2011  Documentation initiated by:  Colleen Can  Subjective/Objective Assessment:   DX herniated disc; lumbar laminectomy, decompression, microdissectomy     Action/Plan:   CM spoke with patient and spouse. Plans are for patient to return to her home in [place], [place] where her spouse will be caregiver. She already has access to RW and commode seat. There are no recommended HH services per pt and ot   Anticipated DC Date:  06/27/2011   Anticipated DC Plan:  HOME/SELF CARE  In-house referral  NA      DC Planning Services  CM consult      PAC Choice  NA   Choice offered to / List presented to:  NA   DME arranged  NA      DME agency  NA     HH arranged  NA      HH agency  NA   Status of service:  Completed, signed off

## 2011-06-26 NOTE — Progress Notes (Signed)
CSW consulted for SNF placement. PT PN reviewed . Pt plans to return home upon d/c. CSW is available to assist if plan changes and SNF is required.

## 2011-06-26 NOTE — Progress Notes (Signed)
Subjective: Improving and has good function in feet.   Objective: Vital signs in last 24 hours: Temp:  [97.4 F (36.3 C)-99.3 F (37.4 C)] 99.3 F (37.4 C) (01/11 1418) Pulse Rate:  [88-105] 96  (01/11 1418) Resp:  [15-22] 20  (01/11 1418) BP: (90-123)/(48-80) 91/55 mmHg (01/11 1418) SpO2:  [93 %-100 %] 97 % (01/11 1418) Weight:  [98.8 kg (217 lb 13 oz)] 98.8 kg (217 lb 13 oz) (01/11 0153)  Intake/Output from previous day: 01/10 0701 - 01/11 0700 In: 2853.7 [P.O.:222; I.V.:2276.7; IV Piggyback:355] Out: 130 [Urine:5; Blood:125] Intake/Output this shift: Total I/O In: 600 [P.O.:600] Out: 1100 [Urine:1100]   Basename 06/24/11 1005  HGB 11.1*    Basename 06/24/11 1005  WBC 12.3*  RBC 4.25  HCT 35.1*  PLT 337    Basename 06/24/11 1005  NA 132*  K 4.5  CL 98  CO2 27  BUN 13  CREATININE 0.74  GLUCOSE 376*  CALCIUM 9.2    Basename 06/24/11 1005  LABPT --  INR 1.08    Neurologically intact  Assessment/Plan: Will hold Plavix until she is discharged.   Nikeria Kalman A 06/26/2011, 2:34 PM

## 2011-06-26 NOTE — Op Note (Signed)
Mindy Love, Mindy Love                ACCOUNT NO.:  [id number]  MEDICAL RECORD NO.:  [id number]  LOCATION:  1439                         FACILITY:  [facility]  PHYSICIAN:  Georges Lynch. Anamari Galeas, M.D.DATE OF BIRTH:  1955/06/29  DATE OF PROCEDURE:  06/25/2011 DATE OF DISCHARGE:                              OPERATIVE REPORT   SURGEON:  Georges Lynch. Darrelyn Hillock, MD  ASSISTANT:  Marlowe Kays, MD  PREOPERATIVE DIAGNOSES: 1. Previous hemilaminectomy, microdiskectomy at L5-S1 on the opposite     left side. 2. Severe spinal stenosis with recurrent disk herniation, L5-S1 on the     right.  POSTOPERATIVE DIAGNOSES: 1. Previous hemilaminectomy, microdiskectomy at L5-S1 on the opposite     left side. 2. Severe spinal stenosis with recurrent disk herniation, L5-S1 on the     right.  OPERATION: 1. Central decompressive lumbar laminectomy for spinal stenosis at L5-     S1. 2. Microdiskectomy at L5-S1 on the right. 3. Foraminotomies for the 4 root and the 5 root for 2 level 2 roots.  PROCEDURE IN DETAIL:  Under general anesthesia, routine orthopedic prepping and draping was carried out.  The patient was placed on the Wilson frame.  The appropriate time-out was carried out.  She had 2 g of IV Ancef.  Two needles were placed in the back and I did mark the appropriate right side of her back in the holding area  first.  Two needles were placed in the back, x-rays were taken to verify our position.  At this particular time, an incision was made through the old incision site and extended proximally and distally.  Note, this was a long difficult dissection because of her obesity.  I took a great deal of time to get through the subcutaneous tissue down to the fascia.  We did a careful dissection.  We were very careful about controlling bleeding since she was on Effient, a platelet inhibitor preop.  We took her off this the appropriate number of days prior to surgery, as suggested by the cardiologist.  We then  carried out the incision down through the deep side of the spinous processes as another x-ray was taken.  We initially took an x-ray with the needles in place.  Following that, we then stripped the muscle on the right side and partially on the left as well at L5-S1.  Great care was taken not to violate the L5-S1 space on the left because of her previous dissection [facility] years ago in [facility] [place].  I then carried out a central decompression, as I mentioned we had to do this because the extreme collapse of the L5-S1 space did have degenerated disc.  Gently we brought the microscope in and gently removed the ligamentum flavum first.  I then identified the lamina above and below and started our laminectomies.  Note, she was extremely tight and the S1 nerve root when it was identified was extremely swollen and was about twice the normal thickness.  We gently retracted the root.  We went out and decompressed the lateral recess.  A needle was placed in the disk space and an x-ray was taken to verify we were at L5-S1 on the right.  Following that, a cruciate incision was made in the posterior longitudinal ligament and I utilized the nerve hook and the Epstein curettes to tease out the disk material.  Specimen was sent to the lab. We went down in the space between the space out as well because of an extremely narrow space.  We did a nice foraminotomy for the S1 root and 5 root above.  We were able to easily pass a hockey-stick out the foramina without any difficulty above and below.  The root now was freely movable, once again it was very swollen.  Thoroughly irrigated out the wound.  We had good hemostasis maintained with thrombin-soaked Gelfoam which was loosely applied.  We then closed the wound layers in usual fashion and I injected 60 cc of a mixture of 20 cc of Exparel and 40 cc of normal saline.  The skin was closed with metal staples.  A sterile Neosporin dressing was applied.  Note during the  procedure, Dr. Simonne Come assisted in regard to the laminectomy, in regard to retraction and identification of the nerve root and also removal scar and a microdiskectomy.  We are definitely going to admit her to a telemetry bed, please put out in there for safety site because of her previous history of open heart.          ______________________________ Georges Lynch. Darrelyn Hillock, M.D.     RAG/MEDQ  D:  06/25/2011  T:  06/26/2011  Job:  [id number]  cc:   Aundra Dubin. Revankar, M.D. Fax: [phone]

## 2011-06-26 NOTE — Progress Notes (Signed)
Physical Therapy Evaluation Patient Details Name: Mindy Love MRN: [id number] DOB: [birth year] Today's Date: 06/26/2011 Time: 4098-1191 Charge: EVII  Problem List: There is no problem list on file for this patient.   Past Medical History:  Past Medical History  Diagnosis Date  . Coronary artery disease   . Myocardial infarction     03/2011  . Herniated disc   . GERD (gastroesophageal reflux disease)   . Frequency   . Diabetes mellitus   . Depression   . Anxiety    Past Surgical History:  Past Surgical History  Procedure Date  . Rotator cuff repair right  . Coronary artery bypass graft X 4 VESSELS 2012  . Cardiac stents   . Back surgery   . Appendectomy   . Oophorectomy   .  nasal surg     PT Assessment/Plan/Recommendation PT Assessment Clinical Impression Statement: Pt would benefit from 1-2 follow up acute care PT visits in order to increase independence with ambulation and stairs and continue to educate on back precautions to prepare for d/c home with spouse.  Pt able to recall 2/3 back precautions from this morning when OT educated her on them.  Pt is hopeful to continue PT for shoulder, back, and R LE strengthening in outpatient setting when cleared by MD. PT Recommendation/Assessment: Patient will need skilled PT in the acute care venue PT Problem List: Decreased strength;Decreased mobility;Decreased knowledge of use of DME;Decreased knowledge of precautions;Pain PT Plan PT Frequency: Min 5X/week (likely 1-2 more visits) PT Treatment/Interventions: DME instruction;Gait training;Stair training;Functional mobility training;Therapeutic exercise;Therapeutic activities;Patient/family education PT Recommendation Follow Up Recommendations: Outpatient PT (when cleared by MD) Equipment Recommended:  (pt reports she can borrow RW) PT Goals  Acute Rehab PT Goals PT Goal Formulation: With patient Time For Goal Achievement: 2 days Pt will go Supine/Side to Sit: with  modified independence;with HOB 0 degrees PT Goal: Supine/Side to Sit - Progress: Not met Pt will go Sit to Supine/Side: with modified independence;with HOB 0 degrees PT Goal: Sit to Supine/Side - Progress: Not met Pt will go Sit to Stand: with modified independence PT Goal: Sit to Stand - Progress: Not met Pt will go Stand to Sit: with modified independence PT Goal: Stand to Sit - Progress: Not met Pt will Ambulate: >150 feet;with modified independence;with least restrictive assistive device PT Goal: Ambulate - Progress: Not met Pt will Go Up / Down Stairs: 3-5 stairs;with rail(s);with least restrictive assistive device PT Goal: Up/Down Stairs - Progress: Not met  PT Evaluation Precautions/Restrictions  Precautions Precautions: Back Required Braces or Orthoses: No Restrictions Weight Bearing Restrictions: No Other Position/Activity Restrictions: No Bending, No arching, No Twisting Prior Functioning  Home Living Lives With: Spouse Receives Help From: Family Type of Home: Mobile home Home Layout: One level Home Access: Stairs to enter Entrance Stairs-Rails: Can reach both Entrance Stairs-Number of Steps: 3 STE Bathroom Shower/Tub: Engineer, manufacturing systems: Standard Bathroom Accessibility: Yes How Accessible: Accessible via walker Home Adaptive Equipment: None Prior Function Level of Independence: Independent with basic ADLs;Independent with transfers;Independent with gait Cognition Cognition Arousal/Alertness: Awake/alert Overall Cognitive Status: Appears within functional limits for tasks assessed Orientation Level: Oriented X4 Sensation/Coordination Sensation Light Touch: Appears Intact Extremity Assessment RUE Assessment RUE Assessment: Exceptions to [facility] (pt w/ old shoulder injury; impaired shoulder flexion) LUE Assessment LUE Assessment: Within Functional Limits RLE Strength Right Hip Flexion: 3+/5 (limited by pain) Right Knee Flexion: 4/5 Right Knee  Extension: 4/5 Right Ankle Dorsiflexion: 5/5 LLE Strength Left Hip Flexion: 5/5 Left  Knee Flexion: 5/5 Left Knee Extension: 5/5 Left Ankle Dorsiflexion: 5/5 Mobility (including Balance) Bed Mobility Bed Mobility: Yes Left Sidelying to Sit: 5: Supervision;HOB elevated (comment degrees) Left Sidelying to Sit Details (indicate cue type and reason): verbal cues for log roll technique, pt sidelying upon entering room Sit to Sidelying Left: 5: Supervision;HOB elevated (comment degrees) Sit to Sidelying Left Details (indicate cue type and reason): could only bring one LE up at a time into sidelying Transfers Transfers: Yes Sit to Stand: 5: Supervision;With upper extremity assist;From bed;From chair/3-in-1 Sit to Stand Details (indicate cue type and reason): verbal cues to adhere to back precautions Stand to Sit: 5: Supervision;With upper extremity assist;To bed;To chair/3-in-1 Ambulation/Gait Ambulation/Gait: Yes Ambulation/Gait Assistance: 5: Supervision Ambulation/Gait Assistance Details (indicate cue type and reason): need for UE support on RW today Ambulation Distance (Feet): 200 Feet Assistive device: Rolling walker Gait Pattern: Step-through pattern    Exercise    End of Session PT - End of Session Activity Tolerance: Patient tolerated treatment well [name];with family/visitor present (pt aware of nsg station call button on bed) General Behavior During Session: [facility] for tasks performed Cognition: [facility] for tasks performed  Mindy Love,Mindy Love 06/26/2011, 12:15 PM Pager: [phone]

## 2011-06-26 NOTE — Progress Notes (Signed)
Occupational Therapy Evaluation Patient Details Name: Mindy Love MRN: [id number] DOB: 1955/12/08 Today's Date: 06/26/2011 9:10-9:45am Ev II; Indirect II  Past Medical History:  Past Medical History  Diagnosis Date  . Coronary artery disease   . Myocardial infarction     03/2011  . Herniated disc   . GERD (gastroesophageal reflux disease)   . Frequency   . Diabetes mellitus   . Depression   . Anxiety    Past Surgical History:  Past Surgical History  Procedure Date  . Rotator cuff repair right  . Coronary artery bypass graft X 4 VESSELS 2012  . Cardiac stents   . Back surgery   . Appendectomy   . Oophorectomy   .  nasal surg     OT Assessment/Plan/Recommendation OT Assessment Clinical Impression Statement: Pt presents s/p back sx w/ decreased ADL status. Pt plans to return home w/ PRN assist from husband. She will benefit from a BSC secondary to back precautions at d/c & a RW. Overall Min A LE dress/bathe & Mod I - Supervision ambulation this am. Reviewed and issued handout for ADL's, back precautions & A/E, pt verbalized understanding of materials after review. Will sign off acute OT at this time, OT Recommendation/Assessment: Patient does not need any further OT services OT Recommendation Follow Up Recommendations: No OT follow up Equipment Recommended: Rolling walker with 5" wheels;3 in 1 bedside comode   OT Evaluation Precautions/Restrictions  Precautions Precautions: Back Required Braces or Orthoses: No Restrictions Weight Bearing Restrictions: No Other Position/Activity Restrictions: No Bending, No arching, No Twisting Prior Functioning Home Living Lives With: Spouse Receives Help From: Family Type of Home: Mobile home Home Layout: One level Home Access: Stairs to enter Entrance Stairs-Rails: Can reach both Entrance Stairs-Number of Steps: 3 STE Bathroom Shower/Tub: Engineer, manufacturing systems: Standard Bathroom Accessibility: Yes How Accessible:  Accessible via walker Home Adaptive Equipment: None Prior Function Level of Independence: Independent with basic ADLs;Independent with transfers ADL ADL Eating/Feeding: Performed;Modified independent Where Assessed - Eating/Feeding: Edge of bed Grooming: Performed;Wash/dry hands;Wash/dry face Where Assessed - Grooming: Sitting, bed Upper Body Bathing: Performed;Chest;Right arm;Left arm;Abdomen;Supervision/safety;Modified independent;Set up Upper Body Bathing Details (indicate cue type and reason): Min A wash back, VC's back precautions, otherwise Mod I Where Assessed - Upper Body Bathing: Sitting, bed;Unsupported;Sit to stand from bed Lower Body Bathing: Performed;Supervision/safety;Set up;Minimal assistance Lower Body Bathing Details (indicate cue type and reason): Mod I/Supervision thighs, Min A calves & feet w/ Supervision sit-stand Where Assessed - Lower Body Bathing: Unsupported;Sitting, bed;Sit to stand from bed Upper Body Dressing: Performed;Set up;Minimal assistance Upper Body Dressing Details (indicate cue type and reason): Don/doff gown after bathing secondary to IV/Lines and back precautions Where Assessed - Upper Body Dressing: Sit to stand from bed;Sitting, bed Lower Body Dressing: Performed;Minimal assistance Lower Body Dressing Details (indicate cue type and reason): Min A don/doff socks Where Assessed - Lower Body Dressing: Sitting, bed;Other (comment) (reviewed A/E, pt states husband to assist PRN) Toilet Transfer: Performed;Supervision/safety;Modified independent Statistician Method: Proofreader: Bedside commode;Raised toilet seat with arms (or 3-in-1 over toilet) Toileting - Clothing Manipulation: Performed;Modified independent Where Assessed - Toileting Clothing Manipulation: Sit to stand from 3-in-1 or toilet;Standing Toileting - Hygiene: Performed;Modified independent Where Assessed - Toileting Hygiene: Sit on 3-in-1 or toilet Tub/Shower  Transfer: Not assessed Equipment Used: Rolling walker;Other (comment) (3:1 ) Ambulation Related to ADLs: Pt ambulated w/ Mod I - Supervision in room w/ RW (around bed to 3:1, then to chair & back around to bed);  moves slowly secondary to pain, RN aware & gave IV meds ADL Comments: Pt presents s/p back sx w/ decreased ADL status. Pt plans to return home w/ PRN assist from husband. She will benefit from a BSC secondary to back precautions at d/c & a RW. Overall Min A LE dress/bathe & Mod I - Supervision ambulation this am. Reviewed and issued handout for ADL's, back precautions & A/E, pt verbalized understanding of materials after review. Will sign off acute OT at this time,. Vision/Perception  Vision - History Patient Visual Report: No change from baseline Cognition Cognition Arousal/Alertness: Awake/alert Overall Cognitive Status: Appears within functional limits for tasks assessed Orientation Level: Oriented X4 Sensation/Coordination Sensation Light Touch: Appears Intact Extremity Assessment RUE Assessment RUE Assessment: Exceptions to [facility] (pt w/ old shoulder injury; impaired shoulder flexion) LUE Assessment LUE Assessment: Within Functional Limits Mobility  Transfers Transfers: Yes Sit to Stand: 5: Supervision;6: Modified independent (Device/Increase time);From chair/3-in-1;From bed;Without upper extremity assist Stand to Sit: 6: Modified independent (Device/Increase time);5: Supervision;Without upper extremity assist;To chair/3-in-1;To bed   End of Session OT - End of Session Equipment Utilized During Treatment: Gait belt;Other (comment) (RW; 3:1) Activity Tolerance: Patient limited by pain [name];with call bell in reach;with family/visitor present;Other (comment) (RN in room as well) Nurse Communication: Mobility status for transfers;Mobility status for ambulation;Other (comment) (RN observed in room) General Behavior During Session: [facility] for tasks  performed Cognition: [facility] for tasks performed   Alm Bustard 06/26/2011, 10:14 AM

## 2011-06-27 LAB — GLUCOSE, CAPILLARY: Glucose-Capillary: 132 mg/dL — ABNORMAL HIGH (ref 70–99)

## 2011-06-27 NOTE — Progress Notes (Signed)
Physical Therapy Treatment Patient Details Name: Mindy Love MRN: [id number] DOB: 1955-11-25 Today's Date: 06/27/2011 8:53-9:20 TA, GT  PT Assessment/Plan   PT Goals  Acute Rehab PT Goals PT Goal: Supine/Side to Sit - Progress: Partly met PT Goal: Sit to Supine/Side - Progress: Partly met PT Goal: Sit to Stand - Progress: Met PT Goal: Stand to Sit - Progress: Met PT Goal: Ambulate - Progress: Partly met PT Goal: Up/Down Stairs - Progress: Not met  PT Treatment Precautions/Restrictions  Precautions Precautions: Back Precaution Comments: reviewed back precautions verbally, pt 3/3 and returned demonstarted with mobilty Required Braces or Orthoses: No Restrictions Weight Bearing Restrictions: No Other Position/Activity Restrictions: No Bending, No arching, No Twisting Mobility (including Balance) Bed Mobility Bed Mobility: Yes Left Sidelying to Sit: 5: Supervision;HOB elevated (comment degrees) Left Sidelying to Sit Details (indicate cue type and reason): cues to remain in hooklying and maintain back with no twisting. Sit to Sidelying Left: 5: Supervision;HOB elevated (comment degrees) Sit to Sidelying Left Details (indicate cue type and reason): Performed nicely without any verbal cures needed Transfers Transfers: Yes Sit to Stand: With upper extremity assist;From bed;From chair/3-in-1;6: Modified independent (Device/Increase time) Stand to Sit: With upper extremity assist;To bed;To chair/3-in-1;6: Modified independent (Device/Increase time) Ambulation/Gait Ambulation/Gait: Yes Ambulation/Gait Assistance: 5: Supervision Ambulation/Gait Assistance Details (indicate cue type and reason): slow , guradedand use of UEs on Rw to deweight back.  Slight SOB after very slow walk.  Discussed importance of small walks daily in house and on level ground after DC home. Ambulation Distance (Feet): 200 Feet Assistive device: Rolling walker Gait Pattern: Step-through pattern Gait  velocity: slow Stairs: No (verbal review of one stpe at a time and guidance behind her) Naval architect Mobility: No    Exercise    End of Session PT - End of Session Activity Tolerance: Patient limited by pain (nurse came in after to give meds for pain.) [name] Nurse Communication: Mobility status for ambulation (educated nurse and CNA to ambulate with pt after lunch today) General Behavior During Session: [facility] for tasks performed Cognition: [facility] for tasks performed Educated patient about proper positioning at home and mobility at home.  Patient feels she is progressing with slightly less pain than yesterday.  Tolerating therapy as well as to be expected.  Thanks   Marella Bile 06/27/2011, 9:31 AM Marella Bile, PT Pager: [phone] 06/27/2011

## 2011-06-27 NOTE — Progress Notes (Signed)
Subjective: 2 Days Post-Op Procedure(s) (LRB): LUMBAR LAMINECTOMY/DECOMPRESSION MICRODISCECTOMY (Right)   Patient reports pain as moderate. Other than pain pt has had no events.  Objective:   VITALS:   Filed Vitals:   06/27/11 1023  BP: 119/54  Pulse: 93  Temp: 99 F (37.2 C)  Resp: 19    Dorsiflexion/Plantar flexion intact Incision: dressing C/D/I No cellulitis present Compartment soft  LABS No results found for this basename: HGB:3,HCT:3,WBC:3,PLT:3 in the last 72 hours  No results found for this basename: NA:3,K:3,BUN:3,CREATININE:3,GLUCOSE:3 in the last 72 hours   Assessment/Plan: 2 Days Post-Op Procedure(s) (LRB): LUMBAR LAMINECTOMY/DECOMPRESSION MICRODISCECTOMY (Right)   Up with therapy   Anastasio Auerbach. Milan Clare   PAC  06/27/2011, 10:49 AM

## 2011-06-28 LAB — GLUCOSE, CAPILLARY: Glucose-Capillary: 176 mg/dL — ABNORMAL HIGH (ref 70–99)

## 2011-06-28 MED ORDER — METHOCARBAMOL 500 MG PO TABS: 500.0000 mg | ORAL_TABLET | Freq: Four times a day (QID) | ORAL | Status: AC | PRN

## 2011-06-28 MED ORDER — OXYCODONE-ACETAMINOPHEN 5-325 MG PO TABS: 1.0000 | ORAL_TABLET | ORAL | Status: AC | PRN

## 2011-06-28 NOTE — Progress Notes (Signed)
Physical Therapy Treatment Patient Details Name: Mindy Love MRN: [id number] DOB: 11/09/1955 Today's Date: 06/28/2011  PT Assessment/Plan  PT - Assessment/Plan Comments on Treatment Session: pt improved; OK to D/C to home from PT standpoint.  Pt plans to have continued PT as an outpatient Equipment Recommended: Rolling walker with 5" wheels PT Goals  Acute Rehab PT Goals PT Goal: Supine/Side to Sit - Progress: Met (per pt) PT Goal: Sit to Supine/Side - Progress: Met (per pt) PT Goal: Sit to Stand - Progress: Met PT Goal: Stand to Sit - Progress: Met PT Goal: Ambulate - Progress: Met PT Goal: Up/Down Stairs - Progress: Met  PT Treatment Precautions/Restrictions  Precautions Precautions: Back Precaution Comments: pt able to state 2/3 precautions verbally,  Reinforced all 3 precautions, especially that patient contract abdomen mucscle, maintain trunk extenson as possible. Required Braces or Orthoses: No Restrictions Weight Bearing Restrictions: No Other Position/Activity Restrictions: No Bending, No arching, No Twisting Mobility (including Balance) Bed Mobility Bed Mobility: Yes Left Sidelying to Sit: 7: Independent Transfers Sit to Stand: 7: Independent Stand to Sit: 7: Independent Ambulation/Gait Ambulation/Gait: Yes Ambulation/Gait Assistance: 6: Modified independent (Device/Increase time) Ambulation/Gait Assistance Details (indicate cue type and reason): gait slow, limited by decreased endurance from recent heart procedure Ambulation Distance (Feet): 225 Feet Assistive device: Rolling walker Gait Pattern: Step-through pattern Gait velocity: slow Stairs: Yes Stairs Assistance: 5: Supervision Stairs Assistance Details (indicate cue type and reason): pt cues to step up with stronger leg, step down with more problematic leg Stair Management Technique: Two rails;Step to pattern;Forwards Number of Stairs: 5  Wheelchair Mobility Wheelchair Mobility: No  Posture/Postural  Control Posture/Postural Control: No significant limitations Exercise  Other Exercises Other Exercises: encouraged standing balance without U/E support for 30-60 sec.   Other Exercises: standing bilateral  U/E hip to hip exercise to activate abdominal muscles End of Session PT - End of Session Equipment Utilized During Treatment:  (RW) Activity Tolerance: Patient tolerated treatment well Patient left:  (sitting on EOB,nurse tech in room) Nurse Communication: Mobility status for transfers General Behavior During Session: [facility] for tasks performed Cognition: [facility] for tasks performed  Donnetta Hail 06/28/2011, 10:53 AM

## 2011-06-28 NOTE — Discharge Summary (Signed)
  Physician Discharge Summary  Patient ID: Mindy Love MRN: [id number] DOB/AGE: 54-28-1957 55 y.o.  Admit date: 06/25/2011 Discharge date: 06/28/2011  Admission Diagnoses: herniated lumbar disc   Discharge Diagnoses: same  Discharged Condition: good  Hospital Course: Pt underwent surgery and had uncomplicated hospital course. On day 2 she was ambulating and pain was controlled on pos. She was stable for dc to home Consults: none    Operative Procedure: Lumbar microdiscectomy  Disposition: Stable  Discharge Orders    Future Orders Please Complete By Expires   Apply dressing      Scheduling Instructions:   Gauze and paper tape to lumbar incision     Current Discharge Medication List    START taking these medications   Details  methocarbamol (ROBAXIN) 500 MG tablet Take 1 tablet (500 mg total) by mouth every 6 (six) hours as needed. Qty: 40 tablet, Refills: 1    oxyCODONE-acetaminophen (PERCOCET) 5-325 MG per tablet Take 1-2 tablets by mouth every 4 (four) hours as needed for pain. Qty: 50 tablet, Refills: 0      CONTINUE these medications which have NOT CHANGED   Details  acetaminophen (TYLENOL) 500 MG tablet Take 1,000 mg by mouth every 6 (six) hours as needed. Pain      ALPRAZolam (XANAX) 1 MG tablet Take 1 mg by mouth every 12 (twelve) hours as needed. Anxiety     aspirin EC 81 MG tablet Take 81 mg by mouth daily after breakfast.      atorvastatin (LIPITOR) 80 MG tablet Take 80 mg by mouth at bedtime.     buPROPion (WELLBUTRIN SR) 150 MG 12 hr tablet Take 150 mg by mouth 2 (two) times daily.     carvedilol (COREG) 3.125 MG tablet Take 3.125 mg by mouth 2 (two) times daily with a meal.     FLUoxetine (PROZAC) 40 MG capsule Take 40 mg by mouth 2 (two) times daily.     gabapentin (NEURONTIN) 300 MG capsule Take 300 mg by mouth 3 (three) times daily.     glipiZIDE-metformin (METAGLIP) 5-500 MG per tablet Take 1 tablet by mouth 2 (two) times daily before a  meal.      pantoprazole (PROTONIX) 20 MG tablet Take 20 mg by mouth daily.     prasugrel (EFFIENT) 10 MG TABS Take 10 mg by mouth daily after breakfast.     insulin glargine (LANTUS) 100 UNIT/ML injection Inject 60 Units into the skin at bedtime.     nitroGLYCERIN (NITROSTAT) 0.4 MG SL tablet Place 0.4 mg under the tongue every 5 (five) minutes as needed. Chest pain        STOP taking these medications     HYDROcodone-acetaminophen (NORCO) 7.5-325 MG per tablet        Follow-up Information    Call GIOFFRE,RONALD A. (to be seen in around 10 days)    Contact information:   [facility] [address] [place] [address] [phone]          Discharge Instructions: see attached  Signed: [facility] 06/28/2011, 9:21 AM

## 2011-06-28 NOTE — Progress Notes (Signed)
Cm spoke with pt concerning d/c planning. Md order to discharge pt 06/28/11 with outpt rehab, RW. Per pt choice [facility] to provide DME prior to discharge. [facility] to contact pt's employer providing worker's comp for reimbursement. MD notified of pt need of outpt rehab rx. Care Coordinator Mardella Layman contacted MD, no one availble to print rx due to scheduled surgeries. Pt instructed to contact Dr.Gioffre's office tomorrow to obtain rx. CM to fax demographics and discharge summary to outpt rehab to alert facility of pt referral. Pt agrees with plan of care.  Mindy Love [phone]

## 2011-06-29 ENCOUNTER — Encounter (HOSPITAL_COMMUNITY): Payer: Self-pay | Admitting: Orthopedic Surgery

## 2011-12-07 ENCOUNTER — Encounter (HOSPITAL_COMMUNITY): Payer: Self-pay | Admitting: Pharmacy Technician

## 2011-12-16 ENCOUNTER — Ambulatory Visit (HOSPITAL_COMMUNITY)
Admission: RE | Admit: 2011-12-16 | Discharge: 2011-12-16 | Disposition: A | Discharge status: Home / Self Care | Payer: Worker's Compensation | Source: Ambulatory Visit | Attending: Surgical | Admitting: Surgical

## 2011-12-16 ENCOUNTER — Encounter (HOSPITAL_COMMUNITY): Payer: Self-pay

## 2011-12-16 ENCOUNTER — Encounter (HOSPITAL_COMMUNITY)
Admission: RE | Admit: 2011-12-16 | Discharge: 2011-12-16 | Disposition: A | Discharge status: Home / Self Care | Payer: Worker's Compensation | Source: Ambulatory Visit | Attending: Orthopedic Surgery | Admitting: Orthopedic Surgery

## 2011-12-16 ENCOUNTER — Ambulatory Visit (HOSPITAL_COMMUNITY)
Admission: RE | Admit: 2011-12-16 | Discharge: 2011-12-16 | Disposition: A | Discharge status: Home / Self Care | Payer: Worker's Compensation | Source: Ambulatory Visit | Attending: Orthopedic Surgery | Admitting: Orthopedic Surgery

## 2011-12-16 DIAGNOSIS — I517 Cardiomegaly: Secondary | ICD-10-CM | POA: Insufficient documentation

## 2011-12-16 DIAGNOSIS — Z01818 Encounter for other preprocedural examination: Secondary | ICD-10-CM | POA: Insufficient documentation

## 2011-12-16 DIAGNOSIS — Z951 Presence of aortocoronary bypass graft: Secondary | ICD-10-CM | POA: Insufficient documentation

## 2011-12-16 DIAGNOSIS — M5137 Other intervertebral disc degeneration, lumbosacral region: Secondary | ICD-10-CM | POA: Insufficient documentation

## 2011-12-16 DIAGNOSIS — M51379 Other intervertebral disc degeneration, lumbosacral region without mention of lumbar back pain or lower extremity pain: Secondary | ICD-10-CM | POA: Insufficient documentation

## 2011-12-16 HISTORY — DX: Hyperlipidemia, unspecified: E78.5

## 2011-12-16 LAB — URINALYSIS, ROUTINE W REFLEX MICROSCOPIC
Bilirubin Urine: NEGATIVE
Glucose, UA: >1000 mg/dL — AB
Hgb urine dipstick: NEGATIVE
Ketones, ur: NEGATIVE mg/dL
Leukocytes, UA: NEGATIVE
Nitrite: NEGATIVE
Protein, ur: NEGATIVE mg/dL
Specific Gravity, Urine: 1.029 (ref 1.005–1.030)
Urobilinogen, UA: 1 mg/dL (ref 0.0–1.0)
pH: 5.5 (ref 5.0–8.0)

## 2011-12-16 LAB — COMPREHENSIVE METABOLIC PANEL
ALT: 22 U/L (ref 0–35)
AST: 19 U/L (ref 0–37)
Albumin: 3.2 g/dL — ABNORMAL LOW (ref 3.5–5.2)
Alkaline Phosphatase: 137 U/L — ABNORMAL HIGH (ref 39–117)
BUN: 14 mg/dL (ref 6–23)
CO2: 26 mEq/L (ref 19–32)
Calcium: 9.6 mg/dL (ref 8.4–10.5)
Chloride: 99 mEq/L (ref 96–112)
Creatinine, Ser: 0.63 mg/dL (ref 0.50–1.10)
GFR calc Af Amer: >90 mL/min (ref >90)
GFR calc non Af Amer: >90 mL/min (ref >90)
Glucose, Bld: 363 mg/dL — ABNORMAL HIGH (ref 70–99)
Potassium: 4.6 mEq/L (ref 3.5–5.1)
Sodium: 135 mEq/L (ref 135–145)
Total Bilirubin: 0.4 mg/dL (ref 0.3–1.2)
Total Protein: 7 g/dL (ref 6.0–8.3)

## 2011-12-16 LAB — DIFFERENTIAL
Basophils Absolute: 0.1 10*3/uL (ref 0.0–0.1)
Basophils Relative: 1 % (ref 0–1)
Eosinophils Absolute: 0.4 10*3/uL (ref 0.0–0.7)
Eosinophils Relative: 3 % (ref 0–5)
Lymphocytes Relative: 23 % (ref 12–46)
Lymphs Abs: 3.1 10*3/uL (ref 0.7–4.0)
Monocytes Absolute: 0.7 10*3/uL (ref 0.1–1.0)
Monocytes Relative: 5 % (ref 3–12)
Neutro Abs: 9 10*3/uL — ABNORMAL HIGH (ref 1.7–7.7)
Neutrophils Relative %: 68 % (ref 43–77)

## 2011-12-16 LAB — URINE MICROSCOPIC-ADD ON

## 2011-12-16 LAB — SURGICAL PCR SCREEN
MRSA, PCR: NEGATIVE
Staphylococcus aureus: NEGATIVE

## 2011-12-16 LAB — APTT: aPTT: 30 seconds (ref 24–37)

## 2011-12-16 LAB — PROTIME-INR
INR: 0.98 (ref 0.00–1.49)
Prothrombin Time: 13.2 seconds (ref 11.6–15.2)

## 2011-12-16 LAB — CBC
HCT: 46 % (ref 36.0–46.0)
Hemoglobin: 15.3 g/dL — ABNORMAL HIGH (ref 12.0–15.0)
MCH: 28.9 pg (ref 26.0–34.0)
MCHC: 33.3 g/dL (ref 30.0–36.0)
MCV: 86.8 fL (ref 78.0–100.0)
Platelets: 286 10*3/uL (ref 150–400)
RBC: 5.3 MIL/uL — ABNORMAL HIGH (ref 3.87–5.11)
RDW: 15.6 % — ABNORMAL HIGH (ref 11.5–15.5)
WBC: 13.3 10*3/uL — ABNORMAL HIGH (ref 4.0–10.5)

## 2011-12-16 NOTE — Pre-Procedure Instructions (Signed)
Reviewed insulin administration  Using teach back method regarding night before surgery and none the morning of surgery

## 2011-12-16 NOTE — Patient Instructions (Addendum)
20 BREALYNN CONTINO  12/16/2011   Your procedure is scheduled on:  12/23/11 Wednesday  Surgery  [id number]  Report to [facility] at  0630     AM.  Call this number if you have problems the morning of surgery: [phone]     Or PST   [id number]  [facility]   Remember:              TAKE ONE HALF DOSAGE OF INSULIN Tuesday NIGHT                           HAVE SNACK BEFORE BED Tuesday NIGHT  Do not eat food:After Midnight. Tuesday NIGHT   Take these medicines the morning of surgery with A SIP OF WATER: Prozac, Wellbutrin, Neurontin, Percocet                             May take Xanax or Nitroglycerin if needed DO NOT TAKE ANY BLOOD SUGAR MEDICINE MORNING OF SURGERY  Do not wear jewelry, make-up or nail polish.  Do not wear lotions, powders, or perfumes. You may wear deodorant.  Do not shave 48 hours prior to surgery.  Do not bring valuables to the hospital.  Contacts, dentures or bridgework may not be worn into surgery.  Leave suitcase in the car. After surgery it may be brought to your room.  For patients admitted to the hospital, checkout time is 11:00 AM the day of discharge.   Patients discharged the day of surgery will not be allowed to drive home.  Name and phone number of your driver:     husband                                                                 Special Instructions: CHG Shower Use Special Wash: 1/2 bottle night before surgery and 1/2 bottle morning of surgery. REGULAR SOAP FACE AND PRIVATES              LADIES- NO SHAVING 48 HOURS BEFORE USING BETASEPT SOAP.              Please read over the following fact sheets that you were given: MRSA Information

## 2011-12-16 NOTE — Pre-Procedure Instructions (Signed)
Spoke with Tammy at [facility] who will notify Dr Darrelyn Hillock of abnormal CMET, urine with micro- Tammy verified procedure is to be "RIGHT"-  States will have schedular take care of having new order placed in EPIC

## 2011-12-21 NOTE — H&P (Signed)
Mindy Love  DOB: Sep 26, 1955   Chief Complaint: Back pain   History of Present Illness  The patient is a 56 year old female who comes in today for a preoperative History and Physical. The patient is scheduled for a right Lumbar Hemilaminectomy and microdiscectomy L5-S1 to be performed by Dr. Georges Lynch. Darrelyn Hillock, MD at [facility] on Wednesday December 23, 2011 . 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 She has been cleared for surgery by Dr. Josiah Lobo at [facility]. Patient has right lower back pain radiating into the right leg, with numbness and tingling. She had the same procedure performed in January 2013 by Dr. Darrelyn Hillock. She began to have symptoms back pain and radicular symptoms again in May 2013 with no known injury. MRI revealed recurrent disc herniation.   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 02/04/2010  Pain in joint, shoulder (719.41). 02/04/2010  Lumbago (724.2). 02/04/2010  Sprain/strain, shoulder/arm NOS (840.9). 02/04/2010  Osteoarthrosis NOS, other spec site (715.98). 02/04/2010  Pain in joint, shoulder (719.41). 03/03/2010  Sprain/strain, shoulder/arm NEC (840.8). 09/02/2010  Depression  Hypertension  Diabetes Mellitus, Type II  Anxiety Disorder  Coronary Artery Disease  Coronary Artery Bypass, Four  Myocardial infarction   Allergies  CODEINE (not the derivatives)  SULFA  TRAMADOL. 10/30/2010 SENSITIVITY   Family History  Heart Disease  Cancer   Social History  Tobacco use. stopped smoking October 2012  Alcohol use. Occasional alcohol use.   Past Surgical History  Hysterectomy (not due to cancer) - Partial  Rotator Cuff Repair - Both  Heart Stents  Low Back Disc Surgery  Coronary Artery Bypass, Four Lumbar hemilaminectomy with microdiscectomy  Review of Systems  General: Not Present- Chills, Fever,  Night Sweats, Fatigue, Weight Gain, Weight Loss and Memory Loss.  Skin: Not Present- Hives, Itching, Rash, Eczema and Lesions.  HEENT: Not Present- Tinnitus, Headache, Double Vision, Visual Loss, Hearing Loss and Dentures.  Respiratory: Not Present- Shortness of breath with exertion, Shortness of breath at rest, Allergies, Coughing up blood and Chronic Cough.  Cardiovascular: Not Present- Chest Pain, Racing/skipping heartbeats, Difficulty Breathing Lying Down, Murmur, Swelling and Palpitations.  Gastrointestinal: Not Present- Bloody Stool, Heartburn, Abdominal Pain, Vomiting, Nausea, Constipation, Diarrhea, Difficulty Swallowing, Jaundice and Loss of appetitie.  Female Genitourinary: Not Present- Blood in Urine, Urinary frequency, Weak urinary stream, Discharge, Flank Pain, Incontinence, Painful Urination, Urgency, Urinary Retention and Urinating at Night.  Musculoskeletal: Present- Muscle Weakness, Muscle Pain and Back Pain. Not Present- Joint Swelling, Joint Pain, Morning Stiffness and Spasms.  Neurological: Present- Tingling and Numbness. Not Present- Tremor, Dizziness, Blackout spells, Paralysis, Difficulty with balance and Weakness.  Psychiatric: Not Present- Insomnia.   Vitals  Weight: 220 lb Height: 64 in    Physical Exam  General  Mental Status - Alert, cooperative and good historian. General Appearance - pleasant. Not in acute distress. Orientation - Oriented X3. Build & Nutrition - Well nourished and Well developed.  Integumentary  Incision scar over the sternum from her CABG in Oct 2012.  Head and Neck  Head - normocephalic, atraumatic .  Neck Global Assessment - supple. no bruit auscultated on the right and no bruit auscultated on the left.  Eye  Pupil - Bilateral - Round, regular Motion - Bilateral - EOMI.  Chest and Lung Exam  Auscultation:  Breath sounds: - clear at anterior chest wall and - clear at posterior  chest wall.  Adventitious sounds: - No Adventitious sounds.    Cardiovascular  Auscultation: Rhythm - Regular rate and rhythm. Heart Sounds - S1 WNL and S2 WNL.  Murmurs & Other Heart Sounds: Auscultation of the heart reveals - No Murmurs.  Abdomen  Palpation/Percussion: Tenderness - Abdomen is non-tender to palpation. Rigidity (guarding) - Abdomen is soft.  Auscultation: Auscultation of the abdomen reveals - Bowel sounds normal.  Female Genitourinary  Not done, not pertinent to present illness  Peripheral Vascular  Lower Extremity:  Palpation: - Pulses bilaterally normal.  Neurologic  Cranial Nerves: - Normal Bilaterally.  Sensory: - Normal.  Overall Assessment of Muscle Strength and Tone reveals:  Lower Extremities: Right Quadriceps - 3/5. Left Quadriceps - 5/5.  General Assessment of Reflexes: Right Knee - 2+. Left Knee - 2+.  Partial foot drop right side. Straight leg raise positive on right. Negative on left.  Musculoskeletal  Hips and knees have full painless range of motion. Pain with lumbar motion causing radicular pain into the right LE. No masses or tumors palpated about the lumbar spine.  Radiographs  Recent MRI did show postsurgical changes with a seroma in the subcutaneous but the main issue is she has a recurrent right posterolateral disk extrusion.    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

## 2011-12-22 ENCOUNTER — Other Ambulatory Visit: Payer: Self-pay | Admitting: Surgical

## 2011-12-22 ENCOUNTER — Encounter (HOSPITAL_COMMUNITY): Payer: Self-pay

## 2011-12-23 ENCOUNTER — Ambulatory Visit (HOSPITAL_COMMUNITY): Payer: Worker's Compensation

## 2011-12-23 ENCOUNTER — Encounter (HOSPITAL_COMMUNITY): Payer: Self-pay | Admitting: *Deleted

## 2011-12-23 ENCOUNTER — Encounter (HOSPITAL_COMMUNITY): Payer: Self-pay | Admitting: Anesthesiology

## 2011-12-23 ENCOUNTER — Inpatient Hospital Stay (HOSPITAL_COMMUNITY)
Admission: RE | Admit: 2011-12-23 | Discharge: 2011-12-25 | DRG: 491 | Disposition: A | Discharge status: Home / Self Care | Payer: Worker's Compensation | Source: Ambulatory Visit | Attending: Orthopedic Surgery | Admitting: Orthopedic Surgery

## 2011-12-23 ENCOUNTER — Encounter (HOSPITAL_COMMUNITY)
Admission: RE | Disposition: A | Discharge status: Home / Self Care | Payer: Self-pay | Source: Ambulatory Visit | Attending: Orthopedic Surgery

## 2011-12-23 ENCOUNTER — Ambulatory Visit (HOSPITAL_COMMUNITY): Payer: Worker's Compensation | Admitting: Anesthesiology

## 2011-12-23 DIAGNOSIS — M5126 Other intervertebral disc displacement, lumbar region: Principal | ICD-10-CM | POA: Diagnosis present

## 2011-12-23 DIAGNOSIS — F411 Generalized anxiety disorder: Secondary | ICD-10-CM | POA: Diagnosis present

## 2011-12-23 DIAGNOSIS — Z951 Presence of aortocoronary bypass graft: Secondary | ICD-10-CM

## 2011-12-23 DIAGNOSIS — E119 Type 2 diabetes mellitus without complications: Secondary | ICD-10-CM | POA: Diagnosis present

## 2011-12-23 DIAGNOSIS — F329 Major depressive disorder, single episode, unspecified: Secondary | ICD-10-CM | POA: Diagnosis present

## 2011-12-23 DIAGNOSIS — M4807 Spinal stenosis, lumbosacral region: Secondary | ICD-10-CM

## 2011-12-23 DIAGNOSIS — I252 Old myocardial infarction: Secondary | ICD-10-CM

## 2011-12-23 DIAGNOSIS — F3289 Other specified depressive episodes: Secondary | ICD-10-CM | POA: Diagnosis present

## 2011-12-23 DIAGNOSIS — I10 Essential (primary) hypertension: Secondary | ICD-10-CM | POA: Diagnosis present

## 2011-12-23 DIAGNOSIS — M48061 Spinal stenosis, lumbar region without neurogenic claudication: Secondary | ICD-10-CM | POA: Diagnosis present

## 2011-12-23 DIAGNOSIS — I2510 Atherosclerotic heart disease of native coronary artery without angina pectoris: Secondary | ICD-10-CM | POA: Diagnosis present

## 2011-12-23 HISTORY — PX: LUMBAR LAMINECTOMY/DECOMPRESSION MICRODISCECTOMY: SHX5026

## 2011-12-23 LAB — GLUCOSE, CAPILLARY
Glucose-Capillary: 127 mg/dL — ABNORMAL HIGH (ref 70–99)
Glucose-Capillary: 66 mg/dL — ABNORMAL LOW (ref 70–99)

## 2011-12-23 SURGERY — LUMBAR LAMINECTOMY/DECOMPRESSION MICRODISCECTOMY
Anesthesia: General | Site: Back | Laterality: Right | Wound class: Clean

## 2011-12-23 MED ORDER — ACETAMINOPHEN 10 MG/ML IV SOLN
INTRAVENOUS | Status: DC | PRN
  Administered 2011-12-23: 1000 mg via INTRAVENOUS

## 2011-12-23 MED ORDER — FENTANYL CITRATE 0.05 MG/ML IJ SOLN
INTRAMUSCULAR | Status: DC | PRN
  Administered 2011-12-23: 50 ug via INTRAVENOUS
  Administered 2011-12-23: 100 ug via INTRAVENOUS
  Administered 2011-12-23 (×2): 50 ug via INTRAVENOUS

## 2011-12-23 MED ORDER — PROPOFOL 10 MG/ML IV EMUL
INTRAVENOUS | Status: DC | PRN
  Administered 2011-12-23: 150 mg via INTRAVENOUS

## 2011-12-23 MED ORDER — BACITRACIN-NEOMYCIN-POLYMYXIN 400-5-5000 EX OINT
TOPICAL_OINTMENT | CUTANEOUS | Status: AC
  Filled 2011-12-23: qty 1

## 2011-12-23 MED ORDER — HYDROMORPHONE HCL PF 1 MG/ML IJ SOLN
INTRAMUSCULAR | Status: AC
  Filled 2011-12-23: qty 1

## 2011-12-23 MED ORDER — ACETAMINOPHEN 325 MG PO TABS: 650.0000 mg | ORAL_TABLET | ORAL | Status: DC | PRN

## 2011-12-23 MED ORDER — ONDANSETRON HCL 4 MG/2ML IJ SOLN: 4.0000 mg | INTRAMUSCULAR | Status: DC | PRN

## 2011-12-23 MED ORDER — ROCURONIUM BROMIDE 100 MG/10ML IV SOLN
INTRAVENOUS | Status: DC | PRN
  Administered 2011-12-23: 10 mg via INTRAVENOUS
  Administered 2011-12-23: 50 mg via INTRAVENOUS

## 2011-12-23 MED ORDER — NEOSTIGMINE METHYLSULFATE 1 MG/ML IJ SOLN
INTRAMUSCULAR | Status: DC | PRN
  Administered 2011-12-23: 5 mg via INTRAVENOUS

## 2011-12-23 MED ORDER — GLYCOPYRROLATE 0.2 MG/ML IJ SOLN
INTRAMUSCULAR | Status: DC | PRN
  Administered 2011-12-23: 0.2 mg via INTRAVENOUS
  Administered 2011-12-23: .8 mg via INTRAVENOUS

## 2011-12-23 MED ORDER — POLYETHYLENE GLYCOL 3350 17 G PO PACK: 17.0000 g | PACK | Freq: Every day | ORAL | Status: DC | PRN

## 2011-12-23 MED ORDER — FLEET ENEMA 7-19 GM/118ML RE ENEM: 1.0000 | ENEMA | Freq: Once | RECTAL | Status: AC | PRN

## 2011-12-23 MED ORDER — METHOCARBAMOL 100 MG/ML IJ SOLN
500.0000 mg | Freq: Four times a day (QID) | INTRAVENOUS | Status: DC | PRN
  Administered 2011-12-23 – 2011-12-24 (×3): 500 mg via INTRAVENOUS
  Filled 2011-12-23 (×3): qty 5

## 2011-12-23 MED ORDER — CEFAZOLIN SODIUM-DEXTROSE 2-3 GM-% IV SOLR
2.0000 g | Freq: Once | INTRAVENOUS | Status: AC
  Administered 2011-12-23: 2 g via INTRAVENOUS

## 2011-12-23 MED ORDER — GLIPIZIDE-METFORMIN HCL 5-500 MG PO TABS: 1.0000 | ORAL_TABLET | Freq: Two times a day (BID) | ORAL | Status: DC

## 2011-12-23 MED ORDER — LIDOCAINE HCL (CARDIAC) 20 MG/ML IV SOLN
INTRAVENOUS | Status: DC | PRN
  Administered 2011-12-23: 100 mg via INTRAVENOUS

## 2011-12-23 MED ORDER — ENALAPRIL MALEATE 2.5 MG PO TABS
2.5000 mg | ORAL_TABLET | Freq: Every evening | ORAL | Status: DC
  Administered 2011-12-24: 2.5 mg via ORAL
  Filled 2011-12-23 (×3): qty 1

## 2011-12-23 MED ORDER — DIPHENHYDRAMINE HCL 50 MG/ML IJ SOLN: 12.5000 mg | Freq: Four times a day (QID) | INTRAMUSCULAR | Status: DC | PRN

## 2011-12-23 MED ORDER — CEFAZOLIN SODIUM 1-5 GM-% IV SOLN
1.0000 g | Freq: Three times a day (TID) | INTRAVENOUS | Status: AC
  Administered 2011-12-23 – 2011-12-24 (×4): 1 g via INTRAVENOUS
  Filled 2011-12-23 (×3): qty 50

## 2011-12-23 MED ORDER — BUPROPION HCL 75 MG PO TABS
75.0000 mg | ORAL_TABLET | Freq: Two times a day (BID) | ORAL | Status: DC
  Administered 2011-12-23 – 2011-12-25 (×4): 75 mg via ORAL
  Filled 2011-12-23 (×5): qty 1

## 2011-12-23 MED ORDER — SODIUM CHLORIDE 0.9 % IR SOLN
Status: DC | PRN
  Administered 2011-12-23: 10:00:00

## 2011-12-23 MED ORDER — METHOCARBAMOL 500 MG PO TABS
500.0000 mg | ORAL_TABLET | Freq: Four times a day (QID) | ORAL | Status: DC | PRN
  Administered 2011-12-24 (×2): 500 mg via ORAL
  Filled 2011-12-23 (×2): qty 1

## 2011-12-23 MED ORDER — SODIUM CHLORIDE 0.9 % IJ SOLN
INTRAMUSCULAR | Status: DC | PRN
  Administered 2011-12-23: 50 mL via INTRAVENOUS

## 2011-12-23 MED ORDER — NITROGLYCERIN 0.4 MG SL SUBL: 0.4000 mg | SUBLINGUAL_TABLET | SUBLINGUAL | Status: DC | PRN

## 2011-12-23 MED ORDER — HYDROMORPHONE HCL PF 1 MG/ML IJ SOLN
INTRAMUSCULAR | Status: AC
  Administered 2011-12-23: 13:00:00
  Filled 2011-12-23: qty 1

## 2011-12-23 MED ORDER — ONDANSETRON HCL 4 MG/2ML IJ SOLN
INTRAMUSCULAR | Status: DC | PRN
  Administered 2011-12-23: 4 mg via INTRAVENOUS

## 2011-12-23 MED ORDER — ACETAMINOPHEN 650 MG RE SUPP: 650.0000 mg | RECTAL | Status: DC | PRN

## 2011-12-23 MED ORDER — HYDROMORPHONE HCL PF 1 MG/ML IJ SOLN
0.2500 mg | INTRAMUSCULAR | Status: DC | PRN
  Administered 2011-12-23 (×2): 0.5 mg via INTRAVENOUS

## 2011-12-23 MED ORDER — THROMBIN 5000 UNITS EX SOLR
CUTANEOUS | Status: DC | PRN
  Administered 2011-12-23: 5000 [IU] via TOPICAL

## 2011-12-23 MED ORDER — HYDROMORPHONE HCL PF 1 MG/ML IJ SOLN
INTRAMUSCULAR | Status: DC | PRN
  Administered 2011-12-23 (×2): 0.5 mg via INTRAVENOUS
  Administered 2011-12-23: 1 mg via INTRAVENOUS

## 2011-12-23 MED ORDER — MIDAZOLAM HCL 5 MG/5ML IJ SOLN
INTRAMUSCULAR | Status: DC | PRN
  Administered 2011-12-23: 2 mg via INTRAVENOUS

## 2011-12-23 MED ORDER — THROMBIN 5000 UNITS EX SOLR
CUTANEOUS | Status: AC
Start: 2011-12-23 — End: 2011-12-23
  Filled 2011-12-23: qty 10000

## 2011-12-23 MED ORDER — BISACODYL 10 MG RE SUPP: 10.0000 mg | Freq: Every day | RECTAL | Status: DC | PRN

## 2011-12-23 MED ORDER — OXYCODONE-ACETAMINOPHEN 5-325 MG PO TABS
1.0000 | ORAL_TABLET | ORAL | Status: DC | PRN
  Administered 2011-12-23 – 2011-12-24 (×3): 2 via ORAL
  Administered 2011-12-24: 1 via ORAL
  Administered 2011-12-24 (×2): 2 via ORAL
  Administered 2011-12-24: 1 via ORAL
  Administered 2011-12-25 (×2): 2 via ORAL
  Filled 2011-12-23 (×9): qty 2

## 2011-12-23 MED ORDER — PRASUGREL HCL 10 MG PO TABS
10.0000 mg | ORAL_TABLET | Freq: Every day | ORAL | Status: DC
  Administered 2011-12-24 – 2011-12-25 (×2): 10 mg via ORAL
  Filled 2011-12-23 (×3): qty 1

## 2011-12-23 MED ORDER — BUPIVACAINE LIPOSOME 1.3 % IJ SUSP
20.0000 mL | Freq: Once | INTRAMUSCULAR | Status: AC
  Administered 2011-12-23: 20 mL
  Filled 2011-12-23: qty 20

## 2011-12-23 MED ORDER — GLIPIZIDE 5 MG PO TABS
5.0000 mg | ORAL_TABLET | Freq: Two times a day (BID) | ORAL | Status: DC
  Filled 2011-12-23 (×2): qty 1

## 2011-12-23 MED ORDER — PROMETHAZINE HCL 25 MG/ML IJ SOLN: 6.2500 mg | INTRAMUSCULAR | Status: DC | PRN

## 2011-12-23 MED ORDER — GABAPENTIN 300 MG PO CAPS
300.0000 mg | ORAL_CAPSULE | Freq: Three times a day (TID) | ORAL | Status: DC
  Administered 2011-12-23 – 2011-12-25 (×5): 300 mg via ORAL
  Filled 2011-12-23 (×8): qty 1

## 2011-12-23 MED ORDER — LACTATED RINGERS IV SOLN
INTRAVENOUS | Status: DC
  Administered 2011-12-23 (×3): via INTRAVENOUS

## 2011-12-23 MED ORDER — INSULIN ASPART 100 UNIT/ML ~~LOC~~ SOLN
0.0000 [IU] | Freq: Three times a day (TID) | SUBCUTANEOUS | Status: DC
  Administered 2011-12-24 – 2011-12-25 (×2): 2 [IU] via SUBCUTANEOUS

## 2011-12-23 MED ORDER — FLUOXETINE HCL 20 MG PO CAPS
40.0000 mg | ORAL_CAPSULE | Freq: Two times a day (BID) | ORAL | Status: DC
  Administered 2011-12-23 – 2011-12-25 (×4): 40 mg via ORAL
  Filled 2011-12-23 (×5): qty 2

## 2011-12-23 MED ORDER — CEFAZOLIN SODIUM-DEXTROSE 2-3 GM-% IV SOLR
INTRAVENOUS | Status: AC
  Filled 2011-12-23: qty 50

## 2011-12-23 MED ORDER — LACTATED RINGERS IV SOLN
INTRAVENOUS | Status: DC
  Administered 2011-12-23 – 2011-12-24 (×2): via INTRAVENOUS

## 2011-12-23 MED ORDER — DIPHENHYDRAMINE HCL 50 MG/ML IJ SOLN
INTRAMUSCULAR | Status: AC
  Administered 2011-12-23: 12.5 mg
  Filled 2011-12-23: qty 1

## 2011-12-23 MED ORDER — ACETAMINOPHEN 10 MG/ML IV SOLN
INTRAVENOUS | Status: AC
  Filled 2011-12-23: qty 100

## 2011-12-23 MED ORDER — METFORMIN HCL 500 MG PO TABS
500.0000 mg | ORAL_TABLET | Freq: Two times a day (BID) | ORAL | Status: DC
  Filled 2011-12-23 (×2): qty 1

## 2011-12-23 MED ORDER — BACITRACIN-NEOMYCIN-POLYMYXIN 400-5-5000 EX OINT
TOPICAL_OINTMENT | CUTANEOUS | Status: DC | PRN
  Administered 2011-12-23: 1 via TOPICAL

## 2011-12-23 MED ORDER — HYDROMORPHONE HCL PF 1 MG/ML IJ SOLN
0.5000 mg | INTRAMUSCULAR | Status: DC | PRN
  Administered 2011-12-23: 1 mg via INTRAVENOUS
  Filled 2011-12-23: qty 1

## 2011-12-23 MED ORDER — ATORVASTATIN CALCIUM 80 MG PO TABS
80.0000 mg | ORAL_TABLET | Freq: Every day | ORAL | Status: DC
  Administered 2011-12-23 – 2011-12-24 (×2): 80 mg via ORAL
  Filled 2011-12-23 (×3): qty 1

## 2011-12-23 MED ORDER — ALPRAZOLAM 1 MG PO TABS: 1.0000 mg | ORAL_TABLET | Freq: Two times a day (BID) | ORAL | Status: DC | PRN

## 2011-12-23 MED ORDER — PHENOL 1.4 % MT LIQD: 1.0000 | OROMUCOSAL | Status: DC | PRN

## 2011-12-23 MED ORDER — MENTHOL 3 MG MT LOZG: 1.0000 | LOZENGE | OROMUCOSAL | Status: DC | PRN

## 2011-12-23 SURGICAL SUPPLY — 53 items
APL SKNCLS STERI-STRIP NONHPOA (GAUZE/BANDAGES/DRESSINGS) ×1
BAG SPEC THK2 15X12 ZIP CLS (MISCELLANEOUS) ×1
BAG ZIPLOCK 12X15 (MISCELLANEOUS) ×2 IMPLANT
BENZOIN TINCTURE PRP APPL 2/3 (GAUZE/BANDAGES/DRESSINGS) ×2 IMPLANT
CLEANER TIP ELECTROSURG 2X2 (MISCELLANEOUS) ×2 IMPLANT
CLOTH BEACON ORANGE TIMEOUT ST (SAFETY) ×2 IMPLANT
CONT SPECI 4OZ STER CLIK (MISCELLANEOUS) ×1 IMPLANT
DRAIN PENROSE 18X1/4 LTX STRL (WOUND CARE) IMPLANT
DRAPE LG THREE QUARTER DISP (DRAPES) ×4 IMPLANT
DRAPE MICROSCOPE LEICA (MISCELLANEOUS) ×2 IMPLANT
DRAPE POUCH INSTRU U-SHP 10X18 (DRAPES) ×2 IMPLANT
DRAPE SURG 17X11 SM STRL (DRAPES) ×2 IMPLANT
DRSG ADAPTIC 3X8 NADH LF (GAUZE/BANDAGES/DRESSINGS) ×2 IMPLANT
DRSG EMULSION OIL 3X16 NADH (GAUZE/BANDAGES/DRESSINGS) ×1 IMPLANT
DRSG PAD ABDOMINAL 8X10 ST (GAUZE/BANDAGES/DRESSINGS) ×5 IMPLANT
DURAPREP 26ML APPLICATOR (WOUND CARE) ×2 IMPLANT
ELECT BLADE 6.5 EXT (BLADE) ×1 IMPLANT
ELECT REM PT RETURN 9FT ADLT (ELECTROSURGICAL) ×2
ELECTRODE REM PT RTRN 9FT ADLT (ELECTROSURGICAL) ×1 IMPLANT
GLOVE BIOGEL PI IND STRL 8 (GLOVE) ×2 IMPLANT
GLOVE BIOGEL PI INDICATOR 8 (GLOVE) ×2
GLOVE ECLIPSE 8.0 STRL XLNG CF (GLOVE) ×4 IMPLANT
GOWN PREVENTION PLUS LG XLONG (DISPOSABLE) ×4 IMPLANT
GOWN STRL REIN XL XLG (GOWN DISPOSABLE) ×4 IMPLANT
KIT BASIN OR (CUSTOM PROCEDURE TRAY) ×2 IMPLANT
KIT POSITIONING SURG ANDREWS (MISCELLANEOUS) ×2 IMPLANT
MANIFOLD NEPTUNE II (INSTRUMENTS) ×2 IMPLANT
NDL SPNL 18GX3.5 QUINCKE PK (NEEDLE) ×2 IMPLANT
NEEDLE SPNL 18GX3.5 QUINCKE PK (NEEDLE) ×4 IMPLANT
NS IRRIG 1000ML POUR BTL (IV SOLUTION) ×2 IMPLANT
PATTIES SURGICAL .5 X.5 (GAUZE/BANDAGES/DRESSINGS) IMPLANT
PATTIES SURGICAL .75X.75 (GAUZE/BANDAGES/DRESSINGS) IMPLANT
PATTIES SURGICAL 1X1 (DISPOSABLE) IMPLANT
PIN SAFETY NICK PLATE  2 MED (MISCELLANEOUS)
PIN SAFETY NICK PLATE 2 MED (MISCELLANEOUS) IMPLANT
POSITIONER SURGICAL ARM (MISCELLANEOUS) ×2 IMPLANT
SPONGE GAUZE 4X4 12PLY (GAUZE/BANDAGES/DRESSINGS) ×2 IMPLANT
SPONGE LAP 4X18 X RAY DECT (DISPOSABLE) ×2 IMPLANT
SPONGE SURGIFOAM ABS GEL 100 (HEMOSTASIS) ×2 IMPLANT
STAPLER VISISTAT 35W (STAPLE) IMPLANT
SUT VIC AB 0 CT1 27 (SUTURE) ×2
SUT VIC AB 0 CT1 27XBRD ANTBC (SUTURE) ×1 IMPLANT
SUT VIC AB 1 CT1 27 (SUTURE) ×2
SUT VIC AB 1 CT1 27XBRD ANTBC (SUTURE) ×4 IMPLANT
SWAB COLLECTION DEVICE MRSA (MISCELLANEOUS) ×1 IMPLANT
SYR 30ML LL (SYRINGE) ×1 IMPLANT
TAPE CLOTH SURG 6X10 WHT LF (GAUZE/BANDAGES/DRESSINGS) ×1 IMPLANT
TOWEL OR 17X26 10 PK STRL BLUE (TOWEL DISPOSABLE) ×4 IMPLANT
TOWEL OR NON WOVEN STRL DISP B (DISPOSABLE) ×2 IMPLANT
TRAY FOLEY BAG SILVER LF 14FR (CATHETERS) ×1 IMPLANT
TRAY LAMINECTOMY (CUSTOM PROCEDURE TRAY) ×2 IMPLANT
TUBE ANAEROBIC SPECIMEN COL (MISCELLANEOUS) ×1 IMPLANT
WATER STERILE IRR 1500ML POUR (IV SOLUTION) ×1 IMPLANT

## 2011-12-23 NOTE — Transfer of Care (Signed)
Immediate Anesthesia Transfer of Care Note  Patient: Mindy Love  Procedure(s) Performed: Procedure(s) (LRB): LUMBAR LAMINECTOMY/DECOMPRESSION MICRODISCECTOMY (Right)  Patient Location: PACU  Anesthesia Type: General  Level of Consciousness: awake, alert  and oriented  Airway & Oxygen Therapy: Patient Spontanous Breathing and Patient connected to face mask oxygen  Post-op Assessment: Report given to PACU RN and Post -op Vital signs reviewed and stable  Post vital signs: Reviewed and stable  Complications: No apparent anesthesia complications

## 2011-12-23 NOTE — Anesthesia Postprocedure Evaluation (Signed)
  Anesthesia Post-op Note  Patient: Mindy Love  Procedure(s) Performed: Procedure(s) (LRB): LUMBAR LAMINECTOMY/DECOMPRESSION MICRODISCECTOMY (Right)  Patient Location: PACU  Anesthesia Type: General  Level of Consciousness: awake and alert   Airway and Oxygen Therapy: Patient Spontanous Breathing  Post-op Pain: mild  Post-op Assessment: Post-op Vital signs reviewed, Patient's Cardiovascular Status Stable, Respiratory Function Stable, Patent Airway and No signs of Nausea or vomiting  Post-op Vital Signs: stable  Complications: No apparent anesthesia complications

## 2011-12-23 NOTE — Progress Notes (Signed)
Benadryl 12.5 given IV for itching per order

## 2011-12-23 NOTE — Anesthesia Preprocedure Evaluation (Addendum)
Anesthesia Evaluation  Patient identified by MRN, date of birth, ID band Patient awake    Reviewed: Allergy & Precautions, H&P , NPO status , Patient's Chart, lab work & pertinent test results  Airway Mallampati: II TM Distance: <3 FB Neck ROM: Full    Dental No notable dental hx.    Pulmonary neg pulmonary ROS,  breath sounds clear to auscultation  Pulmonary exam normal       Cardiovascular + CAD and + CABG Rhythm:Regular Rate:Normal     Neuro/Psych negative neurological ROS  negative psych ROS   GI/Hepatic negative GI ROS, Neg liver ROS,   Endo/Other  Insulin DependentMorbid obesity  Renal/GU negative Renal ROS  negative genitourinary   Musculoskeletal negative musculoskeletal ROS (+)   Abdominal   Peds negative pediatric ROS (+)  Hematology negative hematology ROS (+)   Anesthesia Other Findings   Reproductive/Obstetrics negative OB ROS                           Anesthesia Physical Anesthesia Plan  ASA: III  Anesthesia Plan: General   Post-op Pain Management:    Induction: Intravenous  Airway Management Planned: Oral ETT  Additional Equipment:   Intra-op Plan:   Post-operative Plan: Extubation in OR  Informed Consent: I have reviewed the patients History and Physical, chart, labs and discussed the procedure including the risks, benefits and alternatives for the proposed anesthesia with the patient or authorized representative who has indicated his/her understanding and acceptance.   Dental advisory given  Plan Discussed with: CRNA  Anesthesia Plan Comments: (Continuous cardiac output measured, in addition to SVR and SVVR via Edwards probe.)       Anesthesia Quick Evaluation

## 2011-12-23 NOTE — Preoperative (Signed)
Beta Blockers   Reason not to administer Beta Blockers:Not Applicable 

## 2011-12-23 NOTE — Interval H&P Note (Signed)
History and Physical Interval Note:  12/23/2011 8:19 AM  Mindy Love  has presented today for surgery, with the diagnosis of recurrent herniated disc on right  The various methods of treatment have been discussed with the patient and family. After consideration of risks, benefits and other options for treatment, the patient has consented to  Procedure(s) (LRB): LUMBAR LAMINECTOMY/DECOMPRESSION MICRODISCECTOMY (Right) as a surgical intervention .  The patient's history has been reviewed, patient examined, no change in status, stable for surgery.  I have reviewed the patients' chart and labs.  Questions were answered to the patient's satisfaction.     Anajulia Leyendecker A

## 2011-12-23 NOTE — Brief Op Note (Signed)
12/23/2011  10:58 AM  PATIENT:  Mindy Love  56 y.o. female  PRE-OPERATIVE DIAGNOSIS:  recurrent herniated disc on right L5-S1 and Foraminal Stenosis involving the L-5 and S-1 roots and Spinal Stenosis  POST-OPERATIVE DIAGNOSIS:  recurrent herniated disc on right L5-S1 and Foraminal Stenosis involving the L-5 and S-1 roots and Spinal Stenosis.  PROCEDURE:  Procedure(s) (LRB): LUMBAR LAMINECTOMY/DECOMPRESSION MICRODISCECTOMY (Right) and Froaminotomies of L-5 and S-1 Roots and decompression of L-5--S-1 for Spinal Stenosis on the right.  SURGEON:  Surgeon(s) and Role:    * Jacki Cones, MD - Primary    * Javier Docker, MD - Assisting  :   ASSISTANTS:Jeff Beane MD  ANESTHESIA:   general  EBL:  Total I/O In: 2400 [I.V.:2400] Out: 250 [Urine:200; Blood:50]  BLOOD ADMINISTERED:none  DRAINS: none   LOCAL MEDICATIONS USED:  BUPIVICAINE 20cc mixed with 10cc of Normal Saline  SPECIMEN:  No Specimen  DISPOSITION OF SPECIMEN:  N/A  COUNTS:  YES  TOURNIQUET:  * No tourniquets in log *  DICTATION: .Other Dictation: Dictation Number [id number]  PLAN OF CARE: Admit to inpatient   PATIENT DISPOSITION:  PACU - hemodynamically stable.   Delay start of Pharmacological VTE agent (>24hrs) due to surgical blood loss or risk of bleeding: yes

## 2011-12-24 ENCOUNTER — Encounter (HOSPITAL_COMMUNITY): Payer: Self-pay | Admitting: Orthopedic Surgery

## 2011-12-24 LAB — GLUCOSE, CAPILLARY
Glucose-Capillary: 94 mg/dL (ref 70–99)
Glucose-Capillary: 80 mg/dL (ref 70–99)
Glucose-Capillary: 145 mg/dL — ABNORMAL HIGH (ref 70–99)

## 2011-12-24 NOTE — Evaluation (Signed)
Physical Therapy Evaluation Patient Details Name: Mindy Love MRN: [id number] DOB: [birth year] Today's Date: 12/24/2011 Time: 4098-1191 PT Time Calculation (min): 19 min  PT Assessment / Plan / Recommendation Clinical Impression  56 y.o. female POD #1 for L5-S1 lumbar laminectomy on R. Pt had recent back surgery in January 2013. Pt is mobilizing well for POD #1, she ambulated 40' with supervision and RW. No DME needed. HHPT recommended. Good progress expected. She will benefit from acute PT to maximize safety and independence with mobilty.     PT Assessment  Patient needs continued PT services    Follow Up Recommendations  Home health PT    Barriers to Discharge None      Equipment Recommendations  None recommended by PT    Recommendations for Other Services OT consult   Frequency 7X/week    Precautions / Restrictions Precautions Precautions: Back Restrictions Weight Bearing Restrictions: No   Pertinent Vitals/Pain *6/10 back pain at surgical site at rest and with walking Pt premedicated**      Mobility  Bed Mobility Bed Mobility: Left Sidelying to Sit;Sit to Sidelying Left Left Sidelying to Sit: 5: Supervision;With rails;HOB elevated (HOB 30*) Sit to Sidelying Left: HOB flat;5: Set up;5: Supervision Transfers Transfers: Sit to Stand;Stand to Sit Sit to Stand: 5: Supervision;From bed;With upper extremity assist Stand to Sit: 5: Supervision;To bed;With upper extremity assist Ambulation/Gait Ambulation/Gait Assistance: 5: Supervision Ambulation Distance (Feet): 60 Feet Assistive device: Rolling walker Ambulation/Gait Assistance Details: distance limited by pain Gait Pattern: Within Functional Limits    Exercises     PT Diagnosis: Acute pain;Difficulty walking  PT Problem List: Decreased mobility;Pain;Decreased activity tolerance;Impaired sensation PT Treatment Interventions: Gait training;Stair training;Patient/family education;Therapeutic activities;Functional  mobility training   PT Goals Acute Rehab PT Goals PT Goal Formulation: With patient Time For Goal Achievement: 12/27/11 Potential to Achieve Goals: Good Pt will go Supine/Side to Sit: with modified independence PT Goal: Supine/Side to Sit - Progress: Goal set today Pt will go Sit to Stand: with modified independence PT Goal: Sit to Stand - Progress: Goal set today Pt will Ambulate: >150 feet;with modified independence;with rolling walker PT Goal: Ambulate - Progress: Goal set today Pt will Go Up / Down Stairs: 3-5 stairs;with supervision;with rail(s) PT Goal: Up/Down Stairs - Progress: Goal set today  Visit Information  Last PT Received On: 12/24/11 Assistance Needed: +1    Subjective Data  Subjective: I've been up to the bathroom.  Patient Stated Goal: go home   Prior Functioning  Home Living Lives With: Spouse Available Help at Discharge: Family Home Access: Stairs to enter Secretary/administrator of Steps: 3 Entrance Stairs-Rails: Can reach both;Left;Right Home Layout: One level Bathroom Shower/Tub: Naval architect Equipment: Bedside commode/3-in-1;Walker - rolling Prior Function Level of Independence: Independent Vocation: On disability Communication Communication: No difficulties    Cognition  Overall Cognitive Status: Appears within functional limits for tasks assessed/performed Arousal/Alertness: Awake/alert Orientation Level: Appears intact for tasks assessed Behavior During Session: [facility] for tasks performed    Extremity/Trunk Assessment Right Upper Extremity Assessment RUE ROM/Strength/Tone: WFL for tasks assessed (h/o R shoulder surgery) Left Upper Extremity Assessment LUE ROM/Strength/Tone: WFL for tasks assessed Right Lower Extremity Assessment RLE ROM/Strength/Tone: WFL for tasks assessed RLE Sensation: Deficits RLE Sensation Deficits: decreased to light touch R foot, pt states this is chronic RLE Coordination: WFL - gross/fine motor Left  Lower Extremity Assessment LLE ROM/Strength/Tone: Within functional levels LLE Sensation: WFL - Light Touch LLE Coordination: WFL - gross/fine motor Trunk Assessment Trunk Assessment:  Normal   Balance    End of Session PT - End of Session Equipment Utilized During Treatment: Gait belt Activity Tolerance: Patient limited by fatigue;Patient limited by pain [name];with call bell/phone within reach Nurse Communication: Mobility status  GP     Ralene Bathe Kistler 12/24/2011, 9:17 AM [phone]

## 2011-12-24 NOTE — Op Note (Signed)
NAMESHINA, WASS                ACCOUNT NO.:  [id number]  MEDICAL RECORD NO.:  [id number]  LOCATION:  1609                         FACILITY:  [facility]  PHYSICIAN:  Georges Lynch. Prashant Glosser, M.D.DATE OF BIRTH:  November 22, 1955  DATE OF PROCEDURE:  12/23/2011 DATE OF DISCHARGE:                              OPERATIVE REPORT   SURGEON:  Georges Lynch. Darrelyn Hillock, MD  ASSISTANT:  Jene Every, MD  PREOPERATIVE DIAGNOSES: 1. Recurrent herniated lumbar disk at L5-S1 on the right. 2. Severe foraminal stenosis at the L5 root. 3. Severe foraminal stenosis for the S1 root. 4. Severe lateral recess stenosis.  POSTOPERATIVE DIAGNOSES: 1. Recurrent herniated lumbar disk at L5-S1 on the right. 2. Severe foraminal stenosis at the L5 root. 3. Severe foraminal stenosis for the S1 root. 4. Severe lateral recess stenosis.  OPERATION: 1. Decompressive lumbar laminectomy, L5-S1 on the right for severe     lateral recess stenosis. 2. Microdiskectomy for recurrent herniated lumbar disk at L5-S1. 3. Foraminotomy for the L5 root on the right. 4. Foraminotomy for the S1 root on the right.  Note this was a complex case.  It was a redo.  PROCEDURE:  Under general anesthesia, routine orthopedic prep and drape was carried out with the patient on a spinal frame, the patient had 2 g of IV Ancef.  The appropriate time-out was carried out before any needle placement or surgery was performed.  I also marked the appropriate right side of her back in the holding area.  At this time, after sterile prep and draping and appropriate time-out as I mentioned, the incision was made after we put 2 needles in the back for localization purposes.  The 2 needles were removed and incision was made through the old incision site.  Bleeders identified and cauterized.  The incision was carried down through bilaterally over the lamina L5-S1 region, L4-L5 as well. We inserted the retractors.  Note, this wound was extremely deep, she was obese,  and we had to utilize the extra long McCullough retractors. At that particular time, I went down, identified the L5-S1 space, multiple x-rays were taken.  She had severe collapse of the L5-S1 disk space on the right.  We first went out and did a partial facetectomy and then from there went down and removed large portions of scar.  We then started out laterally and did a partial facetectomy in order to gain access to the space.  We then did a lumbar laminectomy of the S1 and L5 lamina as well.  We then went out far laterally with the curettes and then brought the microscope and obviously we identified the S1 root.  We went up proximally and identified the L5 root and did a nice foraminotomy for the L5 root and the S1 root either.  The 5 root was totally exposed.  The S1 root was exposed.  I removed all the ligamentum and then went down, identified the disk space and the disk space was totally collapsed.  There were small pieces of disk, we were able to remove it from under the S1 root.  We made sure that we had both roots sterilely decompressed.  We did wide foraminotomies for  both roots as well and at this time once we were able to easily pass a hockey-stick out the foramina for both roots, knowing the roots were free.  We then thoroughly irrigated out the area loosely, applied some thrombin-soaked Gelfoam, closed the wound layers in usual fashion except I did leave a small deep distal and proximal part of the wound open for drainage purposes.  I injected the back with a mixture of 20 mL of Exparel with 10 mL of normal saline.  The subcu was closed with 0 Vicryl, skin with metal staples.  Sterile Neosporin dressing was applied.          ______________________________ Georges Lynch Darrelyn Hillock, M.D.     RAG/MEDQ  D:  12/23/2011  T:  12/23/2011  Job:  [id number]

## 2011-12-24 NOTE — Progress Notes (Signed)
Subjective: Moves feet well. Will ambulate today with help and plan on DC Tomorrow.   Objective: Vital signs in last 24 hours: Temp:  [97.5 F (36.4 C)-99.2 F (37.3 C)] 99.2 F (37.3 C) (07/11 0605) Pulse Rate:  [63-87] 84  (07/11 0605) Resp:  [6-16] 16  (07/11 0605) BP: (98-148)/(59-82) 120/73 mmHg (07/11 0605) SpO2:  [94 %-100 %] 95 % (07/11 0605) Weight:  [97.523 kg (215 lb)] 97.523 kg (215 lb) (07/10 1226)  Intake/Output from previous day: 07/10 0701 - 07/11 0700 In: 5815 [P.O.:1560; I.V.:4050; IV Piggyback:205] Out: 2450 [Urine:2400; Blood:50] Intake/Output this shift:    No results found for this basename: HGB:5 in the last 72 hours No results found for this basename: WBC:2,RBC:2,HCT:2,PLT:2 in the last 72 hours No results found for this basename: NA:2,K:2,CL:2,CO2:2,BUN:2,CREATININE:2,GLUCOSE:2,CALCIUM:2 in the last 72 hours No results found for this basename: LABPT:2,INR:2 in the last 72 hours  Neurologically intact  Assessment/Plan: DC Friday if stable.   Alechia Lezama A 12/24/2011, 7:27 AM

## 2011-12-24 NOTE — Progress Notes (Signed)
Physical Therapy Treatment Patient Details Name: Mindy Love MRN: [id number] DOB: [birth year] Today's Date: 12/24/2011 Time: 4098-1191 PT Time Calculation (min): 8 min  PT Assessment / Plan / Recommendation Comments on Treatment Session  Pt progressing well with gait distance. Encouraged pt to ambulate one more time later today. Expect she will be ready to DC home tomorrow.    Follow Up Recommendations  Home health PT    Barriers to Discharge None      Equipment Recommendations  None recommended by PT    Recommendations for Other Services OT consult  Frequency 7X/week   Plan Discharge plan remains appropriate;Frequency remains appropriate    Precautions / Restrictions Precautions Precautions: Back Restrictions Weight Bearing Restrictions: No   Pertinent Vitals/Pain *6/10 back Premedicated **    Mobility  Bed Mobility  Transfers Transfers: Sit to Stand;Stand to Sit Sit to Stand: 5: Supervision;From bed Stand to Sit: 5: Supervision;To bed Details for Transfer Assistance: VCs hand placement Ambulation/Gait Ambulation/Gait Assistance: 5: Supervision Ambulation Distance (Feet): 110 Feet Assistive device: Rolling walker Ambulation/Gait Assistance Details: distance limited by pain Gait Pattern: Step-through pattern General Gait Details: VCs for flexed neck    Exercises     PT Diagnosis: Acute pain;Difficulty walking  PT Problem List: Decreased mobility;Pain;Decreased activity tolerance;Impaired sensation PT Treatment Interventions: Gait training;Stair training;Patient/family education;Therapeutic activities;Functional mobility training   PT Goals Acute Rehab PT Goals PT Goal Formulation: With patient Time For Goal Achievement: 12/27/11 Potential to Achieve Goals: Good Pt will go Supine/Side to Sit: with modified independence PT Goal: Supine/Side to Sit - Progress: Goal set today Pt will go Sit to Stand: with modified independence PT Goal: Sit to Stand -  Progress: Progressing toward goal Pt will Ambulate: >150 feet;with modified independence;with rolling walker PT Goal: Ambulate - Progress: Progressing toward goal Pt will Go Up / Down Stairs: 3-5 stairs;with supervision;with rail(s) PT Goal: Up/Down Stairs - Progress: Goal set today  Visit Information  Last PT Received On: 12/24/11 Assistance Needed: +1    Subjective Data  Subjective: The pain is about the same.  Patient Stated Goal: decrease pain   Cognition  Overall Cognitive Status: Appears within functional limits for tasks assessed/performed Arousal/Alertness: Awake/alert Orientation Level: Appears intact for tasks assessed Behavior During Session: [facility] for tasks performed    Balance     End of Session PT - End of Session Equipment Utilized During Treatment: Gait belt Activity Tolerance: Patient limited by fatigue;Patient limited by pain [name];with call bell/phone within reach;with family/visitor present Nurse Communication: Mobility status   GP     Ralene Bathe Kistler 12/24/2011, 12:00 PM

## 2011-12-24 NOTE — Progress Notes (Signed)
CARE MANAGEMENT NOTE 12/24/2011  Patient:  Mindy Love, Mindy Love   Account Number:  [id number]  Date Initiated:  12/24/2011  Documentation initiated by:  Colleen Can  Subjective/Objective Assessment:   DX HERNIATED DISC L4-S1-LEFT;  worker's comp claim     Action/Plan:   plans home with spouse who will be caregiver. pt does not think she will need hh services. from prev experience  Already has dme-walker   Anticipated DC Date:  12/25/2011   Anticipated DC Plan:  HOME/SELF CARE  In-house referral  NA      DC Planning Services  CM consult          Status of service:  In process, will continue to follow    Per UR Regulation:  Reviewed for med. necessity/level of care/duration of stay

## 2011-12-25 LAB — WOUND CULTURE

## 2011-12-25 MED ORDER — OXYCODONE-ACETAMINOPHEN 10-325 MG PO TABS: 1.0000 | ORAL_TABLET | ORAL | Status: DC

## 2011-12-25 MED ORDER — METHOCARBAMOL 500 MG PO TABS: 500.0000 mg | ORAL_TABLET | Freq: Four times a day (QID) | ORAL | Status: AC | PRN

## 2011-12-25 NOTE — Progress Notes (Signed)
Physical Therapy Treatment Patient Details Name: Mindy Love MRN: [id number] DOB: [birth year] Today's Date: 12/25/2011 Time: 0950-1000 PT Time Calculation (min): 10 min  PT Assessment / Plan / Recommendation Comments on Treatment Session  Pt is at modified independent level with mobility. She ambulated 150' with RW without LOB. 6/10 LBP with walking, pain meds requested. Reviewed back precautions.  Ready to DC home from PT standpoint.     Follow Up Recommendations  Home health PT    Barriers to Discharge        Equipment Recommendations  None recommended by PT    Recommendations for Other Services OT consult  Frequency 7X/week   Plan Discharge plan remains appropriate;Frequency remains appropriate    Precautions / Restrictions Precautions Precautions: Back Restrictions Weight Bearing Restrictions: No   Pertinent Vitals/Pain *6/10 back pain at surgical site with walking, pain meds requested R foot decreased sensation to light touch is pre-existing and stable**    Mobility  Transfers Sit to Stand: 6: Modified independent (Device/Increase time);With upper extremity assist;With armrests;From chair/3-in-1 Stand to Sit: 6: Modified independent (Device/Increase time);With armrests;To chair/3-in-1;With upper extremity assist Ambulation/Gait Ambulation/Gait Assistance: 6: Modified independent (Device/Increase time) Ambulation Distance (Feet): 150 Feet Assistive device: Rolling walker Gait Pattern: Within Functional Limits Stairs: No (pt stated she doesn't need to practice stairs)    Exercises     PT Diagnosis:    PT Problem List:   PT Treatment Interventions:     PT Goals Acute Rehab PT Goals PT Goal Formulation: With patient Time For Goal Achievement: 12/27/11 Potential to Achieve Goals: Good Pt will go Supine/Side to Sit: with modified independence Pt will go Sit to Stand: with modified independence PT Goal: Sit to Stand - Progress: Met Pt will Ambulate: >150  feet;with modified independence;with rolling walker PT Goal: Ambulate - Progress: Met Pt will Go Up / Down Stairs: 3-5 stairs;with supervision;with rail(s)  Visit Information  Last PT Received On: 12/25/11 Assistance Needed: +1    Subjective Data  Subjective: I'm ready to go home. Patient Stated Goal: decrease pain   Cognition  Overall Cognitive Status: Appears within functional limits for tasks assessed/performed Arousal/Alertness: Awake/alert Orientation Level: Appears intact for tasks assessed Behavior During Session: [facility] for tasks performed    Balance     End of Session PT - End of Session Activity Tolerance: Patient limited by pain [name];with call bell/phone within reach Nurse Communication: Mobility status   GP     Mindy Love 12/25/2011, 10:12 AM [phone]

## 2011-12-25 NOTE — Progress Notes (Signed)
   Subjective: 2 Days Post-Op Procedure(s) (LRB): LUMBAR LAMINECTOMY/DECOMPRESSION MICRODISCECTOMY (Right) Patient reports pain as moderate.   Patient seen in rounds with Dr. Darrelyn Hillock. Patient continues to have some pain in the low back and right leg. She reports that it improving slowly. No complaints of chest pain or shortness of breath. Walked increased distance with therapy yesterday. She is ready to go home today.   Objective: Vital signs in last 24 hours: Temp:  [98 F (36.7 C)-98.6 F (37 C)] 98 F (36.7 C) (07/12 0427) Pulse Rate:  [80-92] 80  (07/12 0427) Resp:  [14-16] 16  (07/12 0427) BP: (93-114)/(51-67) 93/51 mmHg (07/12 0427) SpO2:  [86 %-97 %] 96 % (07/12 0427)  Intake/Output from previous day:  Intake/Output Summary (Last 24 hours) at 12/25/11 0722 Last data filed at 12/25/11 0429  Gross per 24 hour  Intake    840 ml  Output   2525 ml  Net  -1685 ml     EXAM General - Patient is Alert and Oriented Extremity - Neurologically intact Intact pulses distally Dorsiflexion/Plantar flexion intact Dressing/Incision - blood tinged drainage. Dressing changed. Motor Function - intact, moving foot and toes well on exam.   Past Medical History  Diagnosis Date  . Coronary artery disease   . Herniated disc   . GERD (gastroesophageal reflux disease)   . Frequency   . Diabetes mellitus   . Depression   . Anxiety   . Hyperlipidemia   . Myocardial infarction 10/12    OV Dr Tomie China  12/22/11 with clearance note on chart , EKG  11/12 on chart    Assessment/Plan: 2 Days Post-Op Procedure(s) (LRB): LUMBAR LAMINECTOMY/DECOMPRESSION MICRODISCECTOMY (Right)  Advance diet Up with therapy D/C IV fluids Discharge home today  DVT Prophylaxis - Aspirin Weight-Bearing as tolerated  Patient needs dressing supplies to take home Will discharge on Oxycodone and Robaxin   Istvan Behar LAUREN 12/25/2011, 7:22 AM

## 2011-12-28 LAB — ANAEROBIC CULTURE

## 2011-12-28 NOTE — Care Management Note (Signed)
    Page 1 of 2   12/28/2011     12:41:02 PM   CARE MANAGEMENT NOTE 12/28/2011  Patient:  Mindy Love, Mindy Love   Account Number:  [id number]  Date Initiated:  12/24/2011  Documentation initiated by:  Colleen Can  Subjective/Objective Assessment:   DX HERNIATED DISC L4-S1-LEFT;  worker's comp claim     Action/Plan:   plans home with spouse who will be caregiver. pt does not think she will need hh services. from prev experience  Already has dme   Anticipated DC Date:  12/25/2011   Anticipated DC Plan:  HOME/SELF CARE  In-house referral  NA      DC Planning Services  CM consult      PAC Choice  NA   Choice offered to / List presented to:  NA   DME arranged  NA      DME agency  NA     HH arranged  HH - 11 Patient Refused      HH agency  NA   Status of service:  Completed, signed off Medicare Important Message given?  NO (If response is "NO", the following Medicare IM given date fields will be blank) Date Medicare IM given:   Date Additional Medicare IM given:    Discharge Disposition:  HOME/SELF CARE  Per UR Regulation:  Reviewed for med. necessity/level of care/duration of stay  If discussed at Long Length of Stay Meetings, dates discussed:    Comments:  12/28/2011 Raynelle Bring BSN CCM [phone] CM spoke with patient regarding order for HHpt that was written 12/25/2011. PT STATES SHE HAS BEEN THRU Similar surgery in the passt and does not feel she needs hhpt. Pt discharged to home with spouse as caregiver.

## 2011-12-30 NOTE — Discharge Summary (Signed)
Physician Discharge Summary   Patient ID: Mindy Love MRN: [id number] DOB/AGE: August 11, 1955 56 y.o.  Admit date: 12/23/2011 Discharge date: 12/25/2011  Primary Diagnosis:  Recurrent lumbar disc herniation  Admission Diagnoses:  Past Medical History  Diagnosis Date  . Coronary artery disease   . Herniated disc   . GERD (gastroesophageal reflux disease)   . Frequency   . Diabetes mellitus   . Depression   . Anxiety   . Hyperlipidemia   . Myocardial infarction 10/12    OV Dr Tomie China  12/22/11 with clearance note on chart , EKG  11/12 on chart   Discharge Diagnoses:   Recurrent lumbar disc herniation S/P lumbar laminectomy/ decompression microdiscectomy right   Procedure:  Procedure(s) (LRB): LUMBAR LAMINECTOMY/DECOMPRESSION MICRODISCECTOMY (Right)   Consults: None  HPI: Patient has had right lower back pain radiating into the right leg, with numbness and tingling. She had the same procedure performed in January 2013 by Dr. Darrelyn Hillock. She began to have symptoms of back pain and radicular symptoms again in May 2013 with no known injury. MRI revealed recurrent lumbar disc herniation.     Laboratory Data: Hospital Outpatient Visit on 12/16/2011  Component Date Value Range Status  . aPTT 12/16/2011 30  24 - 37 seconds Final  . WBC 12/16/2011 13.3* 4.0 - 10.5 K/uL Final  . RBC 12/16/2011 5.30* 3.87 - 5.11 MIL/uL Final  . Hemoglobin 12/16/2011 15.3* 12.0 - 15.0 g/dL Final  . HCT 88/41/6606 46.0  36.0 - 46.0 % Final  . MCV 12/16/2011 86.8  78.0 - 100.0 fL Final  . MCH 12/16/2011 28.9  26.0 - 34.0 pg Final  . MCHC 12/16/2011 33.3  30.0 - 36.0 g/dL Final  . RDW 30/16/0109 15.6* 11.5 - 15.5 % Final  . Platelets 12/16/2011 286  150 - 400 K/uL Final  . Sodium 12/16/2011 135  135 - 145 mEq/L Final  . Potassium 12/16/2011 4.6  3.5 - 5.1 mEq/L Final  . Chloride 12/16/2011 99  96 - 112 mEq/L Final  . CO2 12/16/2011 26  19 - 32 mEq/L Final  . Glucose, Bld 12/16/2011 363* 70 - 99 mg/dL  Final  . BUN 32/35/5732 14  6 - 23 mg/dL Final  . Creatinine, Ser 12/16/2011 0.63  0.50 - 1.10 mg/dL Final  . Calcium 20/25/4270 9.6  8.4 - 10.5 mg/dL Final  . Total Protein 12/16/2011 7.0  6.0 - 8.3 g/dL Final  . Albumin 62/37/6283 3.2* 3.5 - 5.2 g/dL Final  . AST 15/17/6160 19  0 - 37 U/L Final  . ALT 12/16/2011 22  0 - 35 U/L Final  . Alkaline Phosphatase 12/16/2011 137* 39 - 117 U/L Final  . Total Bilirubin 12/16/2011 0.4  0.3 - 1.2 mg/dL Final  . GFR calc non Af Amer 12/16/2011 >90  >90 mL/min Final  . GFR calc Af Amer 12/16/2011 >90  >90 mL/min Final   Comment:                                 The eGFR has been calculated                          using the CKD EPI equation.                          This calculation has not been  validated in all clinical                          situations.                          eGFR's persistently                          <90 mL/min signify                          possible Chronic Kidney Disease.  Marland Kitchen Neutrophils Relative 12/16/2011 68  43 - 77 % Final  . Neutro Abs 12/16/2011 9.0* 1.7 - 7.7 K/uL Final  . Lymphocytes Relative 12/16/2011 23  12 - 46 % Final  . Lymphs Abs 12/16/2011 3.1  0.7 - 4.0 K/uL Final  . Monocytes Relative 12/16/2011 5  3 - 12 % Final  . Monocytes Absolute 12/16/2011 0.7  0.1 - 1.0 K/uL Final  . Eosinophils Relative 12/16/2011 3  0 - 5 % Final  . Eosinophils Absolute 12/16/2011 0.4  0.0 - 0.7 K/uL Final  . Basophils Relative 12/16/2011 1  0 - 1 % Final  . Basophils Absolute 12/16/2011 0.1  0.0 - 0.1 K/uL Final  . Prothrombin Time 12/16/2011 13.2  11.6 - 15.2 seconds Final  . INR 12/16/2011 0.98  0.00 - 1.49 Final  . Color, Urine 12/16/2011 YELLOW  YELLOW Final  . APPearance 12/16/2011 CLEAR  CLEAR Final  . Specific Gravity, Urine 12/16/2011 1.029  1.005 - 1.030 Final  . pH 12/16/2011 5.5  5.0 - 8.0 Final  . Glucose, UA 12/16/2011 >1000* NEGATIVE mg/dL Final  . Hgb urine dipstick 12/16/2011  NEGATIVE  NEGATIVE Final  . Bilirubin Urine 12/16/2011 NEGATIVE  NEGATIVE Final  . Ketones, ur 12/16/2011 NEGATIVE  NEGATIVE mg/dL Final  . Protein, ur 14/78/2956 NEGATIVE  NEGATIVE mg/dL Final  . Urobilinogen, UA 12/16/2011 1.0  0.0 - 1.0 mg/dL Final  . Nitrite 21/30/8657 NEGATIVE  NEGATIVE Final  . Leukocytes, UA 12/16/2011 NEGATIVE  NEGATIVE Final  . MRSA, PCR 12/16/2011 NEGATIVE  NEGATIVE Final  . Staphylococcus aureus 12/16/2011 NEGATIVE  NEGATIVE Final   Comment:                                 The Xpert SA Assay (FDA                          approved for NASAL specimens                          only), is one component of                          a comprehensive surveillance                          program.  It is not intended                          to diagnose infection nor to  guide or monitor treatment.  . Squamous Epithelial / LPF 12/16/2011 FEW* RARE Final  . WBC, UA 12/16/2011 3-6  <3 WBC/hpf Final  . Bacteria, UA 12/16/2011 FEW* RARE Final  . Urine-Other 12/16/2011 MUCOUS PRESENT   Final    X-Rays:Dg Chest 2 View  12/16/2011  *RADIOLOGY REPORT*  Clinical Data: Preop for lumbar spine surgery, former smoking history  CHEST - 2 VIEW  Comparison: Chest x-ray of 04/28/2011  Findings: The lungs are clear.  Mild cardiomegaly is stable. 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  There are degenerative changes throughout the thoracic spine.  IMPRESSION: Stable mild cardiomegaly.  No active lung disease.  Original Report Authenticated By: Juline Patch, M.D.   Dg Lumbar Spine 2-3 Views  12/16/2011  *RADIOLOGY REPORT*  Clinical Data: Preop for lumbar spine surgery  LUMBAR SPINE - 2-3 VIEW  Comparison: Lumbar spine films of 06/24/2011  Findings: The lumbar vertebrae remain straightened in alignment. Degenerative disc disease is again noted particularly at the L5 S1 level with loss of disc space, sclerosis and spurring.  The remainder of intervertebral  disc spaces are unremarkable.  No compression deformity is seen.  The SI joints are corticated. Surgical clips overlie the right pelvis.  IMPRESSION: Normal alignment.  Degenerative disc disease again is noted at the L5 S1 level.  Original Report Authenticated By: Juline Patch, M.D.   Dg Spine Portable 1 View  12/23/2011  *RADIOLOGY REPORT*  Clinical Data: Intraoperative localization of L5-S1.  PORTABLE SPINE - 1 VIEW 12/23/2011 1014 hours:  Comparison: Portable intraoperative lateral lumbar spine x-rays earlier same date.  Two-view lumbar spine x-ray 12/16/2011, demonstrating five non-rib bearing lumbar vertebrae.  Findings: The tip of the localizer probe overlies the posterior L5- S1 disc space.  IMPRESSION: L5-S1 disc space localized intraoperatively.  Original Report Authenticated By: Arnell Sieving, M.D.   Dg Spine Portable 1 View  12/23/2011  *RADIOLOGY REPORT*  Clinical Data: Intraoperative localization for spine surgery.  PORTABLE SPINE - 1 VIEW  Comparison: 12/23/2011, 0924 hours  Findings: Soft tissue retractors are present dorsal to the L5-S1 disc space.  There is a probe in the superior interspinous region at L4-L5, overlying the inferior L4 spinous process.  More inferior probe is present which directly overlies the pedicles of L5.  IMPRESSION: Intraoperative localization as above.  Original Report Authenticated By: Andreas Newport, M.D.   Dg Spine Portable 1 View  12/23/2011  *RADIOLOGY REPORT*  Clinical Data: Intraoperative localization for lumbar spine surgery.  Film #3.  PORTABLE SPINE - 1 VIEW  Comparison: 12/23/2011, 0912 hours.  Findings: Soft tissue retractors are present dorsal to the L5-S1 disc space.  Probe overlies the posterior elements of S1.  IMPRESSION: Intraoperative localization at L5-S1.  Original Report Authenticated By: Andreas Newport, M.D.   Dg Spine Portable 1 View  12/23/2011  *RADIOLOGY REPORT*  Clinical Data: Lumbar spine localization, intraoperative.  PORTABLE  SPINE - 1 VIEW  Comparison: 12/23/2011 at 8:58 a.m.  Findings: A spoon shaped probe is observed projecting over the expected vicinity of the L5 spinous process.  Again noted is reduced intervertebral disc space at the L5-S1 level.  IMPRESSION:  1.  Spoon shaped probe device is oriented at the L5 level; its tip projects over the typical location of the L5 spinous process.  Original Report Authenticated By: Dellia Cloud, M.D.   Dg Spine Portable 1 View  12/23/2011  *RADIOLOGY REPORT*  Clinical Data: Lumbar decompression.  PORTABLE SPINE - 1 VIEW  Comparison: Preoperative films 12/21/2011.  Findings: Single intraoperative view of the lumbar spine in the lateral projection is provided.  Two metallic probes are identified at approximately the S2 and S3 levels.  IMPRESSION: Localization as above.  Original Report Authenticated By: Bernadene Bell. D'ALESSIO, M.D.    EKG: Orders placed during the hospital encounter of 06/25/11  . EKG     Hospital Course: Patient was admitted to [facility] and taken to the OR and underwent the above state procedure without complications.  Patient tolerated the procedure well and was later transferred to the recovery room and then to the orthopaedic floor for postoperative care.  They were given PO and IV analgesics for pain control following their surgery.  They were given 24 hours of postoperative antibiotics. PT was ordered for gait training and assistance.  Discharge planning consulted to help with postop disposition and equipment needs.  Patient had a decent night on the evening of surgery and started to get up OOB with therapy on day one.  Continued to work with therapy into day two.  Dressing was changed on day two and the incision had some blood tinged drainage. Patient was seen in rounds and was ready to go home.    Discharge Medications: Prior to Admission medications   Medication Sig Start Date End Date Taking? Authorizing Provider  ALPRAZolam Prudy Feeler) 1  MG tablet Take 1 mg by mouth every 12 (twelve) hours as needed. Anxiety    Historical Provider, MD  aspirin EC 81 MG tablet Take 81 mg by mouth daily after breakfast. States LD 12/16/11    Historical Provider, MD  atorvastatin (LIPITOR) 80 MG tablet Take 80 mg by mouth at bedtime.     Historical Provider, MD  buPROPion (WELLBUTRIN) 75 MG tablet Take 75 mg by mouth 2 (two) times daily.    Historical Provider, MD  calcium-vitamin D (OSCAL WITH D) 500-200 MG-UNIT per tablet Take 1 tablet by mouth daily.    Historical Provider, MD  diazepam (VALIUM) 10 MG tablet Take 20 mg by mouth at bedtime. spasms    Historical Provider, MD  enalapril (VASOTEC) 2.5 MG tablet Take 2.5 mg by mouth every evening.    Historical Provider, MD  FLUoxetine (PROZAC) 40 MG capsule Take 40 mg by mouth 2 (two) times daily.     Historical Provider, MD  gabapentin (NEURONTIN) 300 MG capsule Take 300 mg by mouth 3 (three) times daily.     Historical Provider, MD  glipiZIDE-metformin (METAGLIP) 5-500 MG per tablet Take 1 tablet by mouth 2 (two) times daily before a meal.     Historical Provider, MD  insulin glargine (LANTUS) 100 UNIT/ML injection Inject 60 Units into the skin at bedtime.     Historical Provider, MD  insulin glulisine (APIDRA) 100 UNIT/ML injection Inject 3-10 Units into the skin 3 (three) times daily before meals. Takes 3 units if sugar is below 150-then will increase as needed    Historical Provider, MD  methocarbamol (ROBAXIN) 500 MG tablet Take 1 tablet (500 mg total) by mouth every 6 (six) hours as needed. 12/25/11 01/04/12  Jeralyn Nolden Tamala Ser, PA  nitroGLYCERIN (NITROSTAT) 0.4 MG SL tablet Place 0.4 mg under the tongue every 5 (five) minutes as needed. Chest pain    Historical Provider, MD  oxyCODONE-acetaminophen (PERCOCET) 10-325 MG per tablet Take 1 tablet by mouth See admin instructions. 1 tablet 5 times per day 12/25/11   Kerby Nora, PA  prasugrel (EFFIENT) 10 MG TABS Take 10 mg  by mouth daily  after breakfast. States LD 12/16/11    Historical Provider, MD    Diet: Diabetic diet Activity:WBAT Follow-up:in 2 weeks Disposition - Home Discharged Condition: fair   Discharge Orders    Future Orders Please Complete By Expires   Diet - low sodium heart healthy      Call MD / Call 911      Comments:   If you experience chest pain or shortness of breath, CALL 911 and be transported to the hospital emergency room.  If you develope a fever above 101 F, pus (white drainage) or increased drainage or redness at the wound, or calf pain, call your surgeon's office.   Constipation Prevention      Comments:   Drink plenty of fluids.  Prune juice may be helpful.  You may use a stool softener, such as Colace (over the counter) 100 mg twice a day.  Use MiraLax (over the counter) for constipation as needed.   Increase activity slowly as tolerated      Discharge instructions      Comments:   Change your dressing daily. Shower only, no tub bath. Call if any temperatures greater than 101 or any wound complications: [phone] during the day and ask for Dr. Jeannetta Ellis nurse, Mackey [place].   Driving restrictions      Comments:   No driving   Patient may shower      Comments:   You may shower.  Do not wash over the wound.  Cover wound with plastic wrap and then shower.     Medication List  As of 12/30/2011 10:51 AM   TAKE these medications         ALPRAZolam 1 MG tablet   Commonly known as: XANAX   Take 1 mg by mouth every 12 (twelve) hours as needed. Anxiety      aspirin EC 81 MG tablet   Take 81 mg by mouth daily after breakfast. States LD 12/16/11      atorvastatin 80 MG tablet   Commonly known as: LIPITOR   Take 80 mg by mouth at bedtime.      buPROPion 75 MG tablet   Commonly known as: WELLBUTRIN   Take 75 mg by mouth 2 (two) times daily.      calcium-vitamin D 500-200 MG-UNIT per tablet   Commonly known as: OSCAL WITH D   Take 1 tablet by mouth daily.      diazepam 10 MG tablet    Commonly known as: VALIUM   Take 20 mg by mouth at bedtime. spasms      enalapril 2.5 MG tablet   Commonly known as: VASOTEC   Take 2.5 mg by mouth every evening.      FLUoxetine 40 MG capsule   Commonly known as: PROZAC   Take 40 mg by mouth 2 (two) times daily.      gabapentin 300 MG capsule   Commonly known as: NEURONTIN   Take 300 mg by mouth 3 (three) times daily.      glipiZIDE-metformin 5-500 MG per tablet   Commonly known as: METAGLIP   Take 1 tablet by mouth 2 (two) times daily before a meal.      insulin glargine 100 UNIT/ML injection   Commonly known as: LANTUS   Inject 60 Units into the skin at bedtime.      insulin glulisine 100 UNIT/ML injection   Commonly known as: APIDRA   Inject 3-10 Units into the skin 3 (three) times  daily before meals. Takes 3 units if sugar is below 150-then will increase as needed      methocarbamol 500 MG tablet   Commonly known as: ROBAXIN   Take 1 tablet (500 mg total) by mouth every 6 (six) hours as needed.      nitroGLYCERIN 0.4 MG SL tablet   Commonly known as: NITROSTAT   Place 0.4 mg under the tongue every 5 (five) minutes as needed. Chest pain      oxyCODONE-acetaminophen 10-325 MG per tablet   Commonly known as: PERCOCET   Take 1 tablet by mouth See admin instructions. 1 tablet 5 times per day      prasugrel 10 MG Tabs   Commonly known as: EFFIENT   Take 10 mg by mouth daily after breakfast. States LD 12/16/11             Signed: Celedonio Savage, Evelene Roussin LAUREN 12/30/2011, 10:51 AM

## 2012-07-26 DIAGNOSIS — M5136 Other intervertebral disc degeneration, lumbar region: Secondary | ICD-10-CM | POA: Insufficient documentation

## 2012-07-26 DIAGNOSIS — M75101 Unspecified rotator cuff tear or rupture of right shoulder, not specified as traumatic: Secondary | ICD-10-CM | POA: Insufficient documentation

## 2012-07-26 DIAGNOSIS — M5417 Radiculopathy, lumbosacral region: Secondary | ICD-10-CM | POA: Insufficient documentation

## 2012-07-26 DIAGNOSIS — M961 Postlaminectomy syndrome, not elsewhere classified: Secondary | ICD-10-CM | POA: Insufficient documentation

## 2012-07-26 DIAGNOSIS — Z79891 Long term (current) use of opiate analgesic: Secondary | ICD-10-CM | POA: Insufficient documentation

## 2014-01-01 ENCOUNTER — Other Ambulatory Visit: Payer: Self-pay | Admitting: Internal Medicine

## 2015-03-18 DIAGNOSIS — G894 Chronic pain syndrome: Secondary | ICD-10-CM | POA: Diagnosis not present

## 2015-03-18 DIAGNOSIS — E118 Type 2 diabetes mellitus with unspecified complications: Secondary | ICD-10-CM | POA: Diagnosis not present

## 2015-03-18 DIAGNOSIS — M961 Postlaminectomy syndrome, not elsewhere classified: Secondary | ICD-10-CM | POA: Diagnosis not present

## 2015-03-18 DIAGNOSIS — Z720 Tobacco use: Secondary | ICD-10-CM | POA: Diagnosis not present

## 2015-03-19 DIAGNOSIS — I2510 Atherosclerotic heart disease of native coronary artery without angina pectoris: Secondary | ICD-10-CM | POA: Insufficient documentation

## 2015-03-19 DIAGNOSIS — M961 Postlaminectomy syndrome, not elsewhere classified: Secondary | ICD-10-CM | POA: Insufficient documentation

## 2015-03-19 DIAGNOSIS — T819XXA Unspecified complication of procedure, initial encounter: Secondary | ICD-10-CM | POA: Insufficient documentation

## 2015-03-25 DIAGNOSIS — J329 Chronic sinusitis, unspecified: Secondary | ICD-10-CM | POA: Diagnosis not present

## 2015-03-25 DIAGNOSIS — Z23 Encounter for immunization: Secondary | ICD-10-CM | POA: Diagnosis not present

## 2015-03-25 DIAGNOSIS — M5441 Lumbago with sciatica, right side: Secondary | ICD-10-CM | POA: Diagnosis not present

## 2015-03-25 DIAGNOSIS — N760 Acute vaginitis: Secondary | ICD-10-CM | POA: Diagnosis not present

## 2015-05-01 DIAGNOSIS — M5440 Lumbago with sciatica, unspecified side: Secondary | ICD-10-CM | POA: Diagnosis not present

## 2015-05-01 DIAGNOSIS — M9971 Connective tissue and disc stenosis of intervertebral foramina of cervical region: Secondary | ICD-10-CM | POA: Diagnosis not present

## 2015-05-01 DIAGNOSIS — M4720 Other spondylosis with radiculopathy, site unspecified: Secondary | ICD-10-CM | POA: Diagnosis not present

## 2015-05-01 DIAGNOSIS — M9961 Osseous and subluxation stenosis of intervertebral foramina of cervical region: Secondary | ICD-10-CM | POA: Diagnosis not present

## 2015-05-01 DIAGNOSIS — M5412 Radiculopathy, cervical region: Secondary | ICD-10-CM | POA: Diagnosis not present

## 2015-05-01 DIAGNOSIS — M479 Spondylosis, unspecified: Secondary | ICD-10-CM | POA: Diagnosis not present

## 2015-05-27 DIAGNOSIS — M5136 Other intervertebral disc degeneration, lumbar region: Secondary | ICD-10-CM | POA: Diagnosis not present

## 2015-05-27 DIAGNOSIS — I10 Essential (primary) hypertension: Secondary | ICD-10-CM | POA: Diagnosis not present

## 2015-05-27 DIAGNOSIS — R69 Illness, unspecified: Secondary | ICD-10-CM | POA: Diagnosis not present

## 2015-05-27 DIAGNOSIS — G4700 Insomnia, unspecified: Secondary | ICD-10-CM | POA: Diagnosis not present

## 2015-06-27 DIAGNOSIS — E118 Type 2 diabetes mellitus with unspecified complications: Secondary | ICD-10-CM | POA: Diagnosis not present

## 2015-06-27 DIAGNOSIS — J40 Bronchitis, not specified as acute or chronic: Secondary | ICD-10-CM | POA: Diagnosis not present

## 2015-06-27 DIAGNOSIS — I748 Embolism and thrombosis of other arteries: Secondary | ICD-10-CM | POA: Diagnosis not present

## 2015-06-27 DIAGNOSIS — R69 Illness, unspecified: Secondary | ICD-10-CM | POA: Diagnosis not present

## 2015-06-27 DIAGNOSIS — I10 Essential (primary) hypertension: Secondary | ICD-10-CM | POA: Diagnosis not present

## 2015-06-27 DIAGNOSIS — E669 Obesity, unspecified: Secondary | ICD-10-CM | POA: Diagnosis not present

## 2015-06-27 DIAGNOSIS — M545 Low back pain: Secondary | ICD-10-CM | POA: Diagnosis not present

## 2015-07-18 DIAGNOSIS — L02213 Cutaneous abscess of chest wall: Secondary | ICD-10-CM | POA: Diagnosis not present

## 2015-07-20 DIAGNOSIS — L02213 Cutaneous abscess of chest wall: Secondary | ICD-10-CM | POA: Diagnosis not present

## 2015-07-26 DIAGNOSIS — I2510 Atherosclerotic heart disease of native coronary artery without angina pectoris: Secondary | ICD-10-CM | POA: Diagnosis not present

## 2015-07-26 DIAGNOSIS — E118 Type 2 diabetes mellitus with unspecified complications: Secondary | ICD-10-CM | POA: Diagnosis not present

## 2015-07-26 DIAGNOSIS — G4700 Insomnia, unspecified: Secondary | ICD-10-CM | POA: Diagnosis not present

## 2015-07-26 DIAGNOSIS — J069 Acute upper respiratory infection, unspecified: Secondary | ICD-10-CM | POA: Diagnosis not present

## 2015-07-26 DIAGNOSIS — I739 Peripheral vascular disease, unspecified: Secondary | ICD-10-CM | POA: Diagnosis not present

## 2015-07-26 DIAGNOSIS — E785 Hyperlipidemia, unspecified: Secondary | ICD-10-CM | POA: Diagnosis not present

## 2015-07-26 DIAGNOSIS — R69 Illness, unspecified: Secondary | ICD-10-CM | POA: Diagnosis not present

## 2015-07-26 DIAGNOSIS — R509 Fever, unspecified: Secondary | ICD-10-CM | POA: Diagnosis not present

## 2015-07-26 DIAGNOSIS — I252 Old myocardial infarction: Secondary | ICD-10-CM | POA: Diagnosis not present

## 2015-07-26 DIAGNOSIS — Z951 Presence of aortocoronary bypass graft: Secondary | ICD-10-CM | POA: Diagnosis not present

## 2015-07-31 DIAGNOSIS — R05 Cough: Secondary | ICD-10-CM | POA: Diagnosis not present

## 2015-07-31 DIAGNOSIS — Z95820 Peripheral vascular angioplasty status with implants and grafts: Secondary | ICD-10-CM | POA: Diagnosis not present

## 2015-07-31 DIAGNOSIS — R188 Other ascites: Secondary | ICD-10-CM | POA: Diagnosis not present

## 2015-07-31 DIAGNOSIS — I739 Peripheral vascular disease, unspecified: Secondary | ICD-10-CM | POA: Diagnosis not present

## 2015-07-31 DIAGNOSIS — R509 Fever, unspecified: Secondary | ICD-10-CM | POA: Diagnosis not present

## 2015-07-31 DIAGNOSIS — T82868A Thrombosis of vascular prosthetic devices, implants and grafts, initial encounter: Secondary | ICD-10-CM | POA: Diagnosis not present

## 2015-07-31 DIAGNOSIS — J9811 Atelectasis: Secondary | ICD-10-CM | POA: Diagnosis not present

## 2015-07-31 DIAGNOSIS — M79671 Pain in right foot: Secondary | ICD-10-CM | POA: Diagnosis not present

## 2015-08-01 DIAGNOSIS — I493 Ventricular premature depolarization: Secondary | ICD-10-CM | POA: Diagnosis not present

## 2015-08-01 DIAGNOSIS — K3184 Gastroparesis: Secondary | ICD-10-CM | POA: Diagnosis not present

## 2015-08-01 DIAGNOSIS — E0865 Diabetes mellitus due to underlying condition with hyperglycemia: Secondary | ICD-10-CM | POA: Diagnosis not present

## 2015-08-01 DIAGNOSIS — Z8679 Personal history of other diseases of the circulatory system: Secondary | ICD-10-CM | POA: Diagnosis not present

## 2015-08-01 DIAGNOSIS — Z95820 Peripheral vascular angioplasty status with implants and grafts: Secondary | ICD-10-CM | POA: Diagnosis not present

## 2015-08-01 DIAGNOSIS — N179 Acute kidney failure, unspecified: Secondary | ICD-10-CM | POA: Diagnosis not present

## 2015-08-01 DIAGNOSIS — Z89511 Acquired absence of right leg below knee: Secondary | ICD-10-CM | POA: Diagnosis not present

## 2015-08-01 DIAGNOSIS — R7881 Bacteremia: Secondary | ICD-10-CM | POA: Diagnosis not present

## 2015-08-01 DIAGNOSIS — I509 Heart failure, unspecified: Secondary | ICD-10-CM | POA: Diagnosis not present

## 2015-08-01 DIAGNOSIS — I70201 Unspecified atherosclerosis of native arteries of extremities, right leg: Secondary | ICD-10-CM | POA: Diagnosis not present

## 2015-08-01 DIAGNOSIS — I739 Peripheral vascular disease, unspecified: Secondary | ICD-10-CM | POA: Diagnosis not present

## 2015-08-01 DIAGNOSIS — I517 Cardiomegaly: Secondary | ICD-10-CM | POA: Diagnosis not present

## 2015-08-01 DIAGNOSIS — R0989 Other specified symptoms and signs involving the circulatory and respiratory systems: Secondary | ICD-10-CM | POA: Diagnosis not present

## 2015-08-01 DIAGNOSIS — I998 Other disorder of circulatory system: Secondary | ICD-10-CM | POA: Diagnosis not present

## 2015-08-01 DIAGNOSIS — R609 Edema, unspecified: Secondary | ICD-10-CM | POA: Diagnosis not present

## 2015-08-01 DIAGNOSIS — R278 Other lack of coordination: Secondary | ICD-10-CM | POA: Diagnosis not present

## 2015-08-01 DIAGNOSIS — R188 Other ascites: Secondary | ICD-10-CM | POA: Diagnosis not present

## 2015-08-01 DIAGNOSIS — S88111S Complete traumatic amputation at level between knee and ankle, right lower leg, sequela: Secondary | ICD-10-CM | POA: Diagnosis not present

## 2015-08-01 DIAGNOSIS — B9620 Unspecified Escherichia coli [E. coli] as the cause of diseases classified elsewhere: Secondary | ICD-10-CM | POA: Diagnosis not present

## 2015-08-01 DIAGNOSIS — T501X5A Adverse effect of loop [high-ceiling] diuretics, initial encounter: Secondary | ICD-10-CM | POA: Diagnosis not present

## 2015-08-01 DIAGNOSIS — B488 Other specified mycoses: Secondary | ICD-10-CM | POA: Diagnosis not present

## 2015-08-01 DIAGNOSIS — E0840 Diabetes mellitus due to underlying condition with diabetic neuropathy, unspecified: Secondary | ICD-10-CM | POA: Diagnosis not present

## 2015-08-01 DIAGNOSIS — E1143 Type 2 diabetes mellitus with diabetic autonomic (poly)neuropathy: Secondary | ICD-10-CM | POA: Diagnosis not present

## 2015-08-01 DIAGNOSIS — R2681 Unsteadiness on feet: Secondary | ICD-10-CM | POA: Diagnosis not present

## 2015-08-01 DIAGNOSIS — R0902 Hypoxemia: Secondary | ICD-10-CM | POA: Diagnosis not present

## 2015-08-01 DIAGNOSIS — R509 Fever, unspecified: Secondary | ICD-10-CM | POA: Diagnosis not present

## 2015-08-01 DIAGNOSIS — J449 Chronic obstructive pulmonary disease, unspecified: Secondary | ICD-10-CM | POA: Diagnosis not present

## 2015-08-01 DIAGNOSIS — N390 Urinary tract infection, site not specified: Secondary | ICD-10-CM | POA: Diagnosis not present

## 2015-08-01 DIAGNOSIS — I4892 Unspecified atrial flutter: Secondary | ICD-10-CM | POA: Diagnosis not present

## 2015-08-01 DIAGNOSIS — R0602 Shortness of breath: Secondary | ICD-10-CM | POA: Diagnosis not present

## 2015-08-01 DIAGNOSIS — L97919 Non-pressure chronic ulcer of unspecified part of right lower leg with unspecified severity: Secondary | ICD-10-CM | POA: Diagnosis not present

## 2015-08-01 DIAGNOSIS — T82868A Thrombosis of vascular prosthetic devices, implants and grafts, initial encounter: Secondary | ICD-10-CM | POA: Diagnosis not present

## 2015-08-01 DIAGNOSIS — R9431 Abnormal electrocardiogram [ECG] [EKG]: Secondary | ICD-10-CM | POA: Diagnosis not present

## 2015-08-01 DIAGNOSIS — M79671 Pain in right foot: Secondary | ICD-10-CM | POA: Diagnosis not present

## 2015-08-01 DIAGNOSIS — I10 Essential (primary) hypertension: Secondary | ICD-10-CM | POA: Diagnosis not present

## 2015-08-01 DIAGNOSIS — R112 Nausea with vomiting, unspecified: Secondary | ICD-10-CM | POA: Diagnosis not present

## 2015-08-01 DIAGNOSIS — I70231 Atherosclerosis of native arteries of right leg with ulceration of thigh: Secondary | ICD-10-CM | POA: Diagnosis not present

## 2015-08-01 DIAGNOSIS — R140 Abdominal distension (gaseous): Secondary | ICD-10-CM | POA: Diagnosis not present

## 2015-08-01 DIAGNOSIS — Z9049 Acquired absence of other specified parts of digestive tract: Secondary | ICD-10-CM | POA: Diagnosis not present

## 2015-08-01 DIAGNOSIS — Z951 Presence of aortocoronary bypass graft: Secondary | ICD-10-CM | POA: Diagnosis not present

## 2015-08-12 DIAGNOSIS — Z89511 Acquired absence of right leg below knee: Secondary | ICD-10-CM | POA: Diagnosis not present

## 2015-08-12 DIAGNOSIS — R2681 Unsteadiness on feet: Secondary | ICD-10-CM | POA: Diagnosis not present

## 2015-08-12 DIAGNOSIS — L03115 Cellulitis of right lower limb: Secondary | ICD-10-CM | POA: Diagnosis not present

## 2015-08-12 DIAGNOSIS — R54 Age-related physical debility: Secondary | ICD-10-CM | POA: Diagnosis not present

## 2015-08-12 DIAGNOSIS — E0865 Diabetes mellitus due to underlying condition with hyperglycemia: Secondary | ICD-10-CM | POA: Diagnosis not present

## 2015-08-12 DIAGNOSIS — M79604 Pain in right leg: Secondary | ICD-10-CM | POA: Diagnosis not present

## 2015-08-12 DIAGNOSIS — I998 Other disorder of circulatory system: Secondary | ICD-10-CM | POA: Diagnosis not present

## 2015-08-12 DIAGNOSIS — I10 Essential (primary) hypertension: Secondary | ICD-10-CM | POA: Diagnosis not present

## 2015-08-12 DIAGNOSIS — R7881 Bacteremia: Secondary | ICD-10-CM | POA: Diagnosis not present

## 2015-08-12 DIAGNOSIS — B9620 Unspecified Escherichia coli [E. coli] as the cause of diseases classified elsewhere: Secondary | ICD-10-CM | POA: Diagnosis not present

## 2015-08-12 DIAGNOSIS — R278 Other lack of coordination: Secondary | ICD-10-CM | POA: Diagnosis not present

## 2015-08-12 DIAGNOSIS — S88111S Complete traumatic amputation at level between knee and ankle, right lower leg, sequela: Secondary | ICD-10-CM | POA: Diagnosis not present

## 2015-08-12 DIAGNOSIS — J449 Chronic obstructive pulmonary disease, unspecified: Secondary | ICD-10-CM | POA: Diagnosis not present

## 2015-08-12 DIAGNOSIS — I4892 Unspecified atrial flutter: Secondary | ICD-10-CM | POA: Diagnosis not present

## 2015-08-12 DIAGNOSIS — I739 Peripheral vascular disease, unspecified: Secondary | ICD-10-CM | POA: Diagnosis not present

## 2015-08-12 DIAGNOSIS — E0840 Diabetes mellitus due to underlying condition with diabetic neuropathy, unspecified: Secondary | ICD-10-CM | POA: Diagnosis not present

## 2015-08-13 DIAGNOSIS — R54 Age-related physical debility: Secondary | ICD-10-CM | POA: Diagnosis not present

## 2015-08-13 DIAGNOSIS — S88111S Complete traumatic amputation at level between knee and ankle, right lower leg, sequela: Secondary | ICD-10-CM | POA: Diagnosis not present

## 2015-08-13 DIAGNOSIS — L03115 Cellulitis of right lower limb: Secondary | ICD-10-CM | POA: Diagnosis not present

## 2015-08-13 DIAGNOSIS — M79604 Pain in right leg: Secondary | ICD-10-CM | POA: Diagnosis not present

## 2015-08-26 DIAGNOSIS — R2689 Other abnormalities of gait and mobility: Secondary | ICD-10-CM | POA: Diagnosis not present

## 2015-08-26 DIAGNOSIS — J449 Chronic obstructive pulmonary disease, unspecified: Secondary | ICD-10-CM | POA: Diagnosis not present

## 2015-08-26 DIAGNOSIS — E119 Type 2 diabetes mellitus without complications: Secondary | ICD-10-CM | POA: Diagnosis not present

## 2015-08-26 DIAGNOSIS — I10 Essential (primary) hypertension: Secondary | ICD-10-CM | POA: Diagnosis not present

## 2015-08-26 DIAGNOSIS — F17210 Nicotine dependence, cigarettes, uncomplicated: Secondary | ICD-10-CM | POA: Diagnosis not present

## 2015-08-26 DIAGNOSIS — E1151 Type 2 diabetes mellitus with diabetic peripheral angiopathy without gangrene: Secondary | ICD-10-CM | POA: Diagnosis not present

## 2015-08-26 DIAGNOSIS — E1140 Type 2 diabetes mellitus with diabetic neuropathy, unspecified: Secondary | ICD-10-CM | POA: Diagnosis not present

## 2015-08-26 DIAGNOSIS — Z89611 Acquired absence of right leg above knee: Secondary | ICD-10-CM | POA: Diagnosis not present

## 2015-08-26 DIAGNOSIS — Z89511 Acquired absence of right leg below knee: Secondary | ICD-10-CM | POA: Diagnosis not present

## 2015-08-26 DIAGNOSIS — Z794 Long term (current) use of insulin: Secondary | ICD-10-CM | POA: Diagnosis not present

## 2015-08-26 DIAGNOSIS — R69 Illness, unspecified: Secondary | ICD-10-CM | POA: Diagnosis not present

## 2015-08-26 DIAGNOSIS — I4892 Unspecified atrial flutter: Secondary | ICD-10-CM | POA: Diagnosis not present

## 2015-08-26 DIAGNOSIS — I739 Peripheral vascular disease, unspecified: Secondary | ICD-10-CM | POA: Diagnosis not present

## 2015-08-26 DIAGNOSIS — Z4781 Encounter for orthopedic aftercare following surgical amputation: Secondary | ICD-10-CM | POA: Diagnosis not present

## 2015-08-27 DIAGNOSIS — I4892 Unspecified atrial flutter: Secondary | ICD-10-CM | POA: Diagnosis not present

## 2015-08-27 DIAGNOSIS — Z4781 Encounter for orthopedic aftercare following surgical amputation: Secondary | ICD-10-CM | POA: Diagnosis not present

## 2015-08-27 DIAGNOSIS — Z794 Long term (current) use of insulin: Secondary | ICD-10-CM | POA: Diagnosis not present

## 2015-08-27 DIAGNOSIS — E1140 Type 2 diabetes mellitus with diabetic neuropathy, unspecified: Secondary | ICD-10-CM | POA: Diagnosis not present

## 2015-08-27 DIAGNOSIS — I10 Essential (primary) hypertension: Secondary | ICD-10-CM | POA: Diagnosis not present

## 2015-08-27 DIAGNOSIS — R69 Illness, unspecified: Secondary | ICD-10-CM | POA: Diagnosis not present

## 2015-08-27 DIAGNOSIS — I739 Peripheral vascular disease, unspecified: Secondary | ICD-10-CM | POA: Diagnosis not present

## 2015-08-27 DIAGNOSIS — E119 Type 2 diabetes mellitus without complications: Secondary | ICD-10-CM | POA: Diagnosis not present

## 2015-08-27 DIAGNOSIS — Z89511 Acquired absence of right leg below knee: Secondary | ICD-10-CM | POA: Diagnosis not present

## 2015-08-27 DIAGNOSIS — J449 Chronic obstructive pulmonary disease, unspecified: Secondary | ICD-10-CM | POA: Diagnosis not present

## 2015-08-27 DIAGNOSIS — F17210 Nicotine dependence, cigarettes, uncomplicated: Secondary | ICD-10-CM | POA: Diagnosis not present

## 2015-08-28 DIAGNOSIS — E118 Type 2 diabetes mellitus with unspecified complications: Secondary | ICD-10-CM | POA: Diagnosis not present

## 2015-08-28 DIAGNOSIS — E785 Hyperlipidemia, unspecified: Secondary | ICD-10-CM | POA: Diagnosis not present

## 2015-08-28 DIAGNOSIS — I739 Peripheral vascular disease, unspecified: Secondary | ICD-10-CM | POA: Diagnosis not present

## 2015-08-28 DIAGNOSIS — Z951 Presence of aortocoronary bypass graft: Secondary | ICD-10-CM | POA: Diagnosis not present

## 2015-08-28 DIAGNOSIS — Z89511 Acquired absence of right leg below knee: Secondary | ICD-10-CM | POA: Diagnosis not present

## 2015-08-28 DIAGNOSIS — E1140 Type 2 diabetes mellitus with diabetic neuropathy, unspecified: Secondary | ICD-10-CM | POA: Diagnosis not present

## 2015-08-30 DIAGNOSIS — I739 Peripheral vascular disease, unspecified: Secondary | ICD-10-CM | POA: Diagnosis not present

## 2015-08-30 DIAGNOSIS — Z8639 Personal history of other endocrine, nutritional and metabolic disease: Secondary | ICD-10-CM | POA: Diagnosis not present

## 2015-09-02 DIAGNOSIS — I739 Peripheral vascular disease, unspecified: Secondary | ICD-10-CM | POA: Diagnosis not present

## 2015-09-02 DIAGNOSIS — R69 Illness, unspecified: Secondary | ICD-10-CM | POA: Diagnosis not present

## 2015-09-02 DIAGNOSIS — Z794 Long term (current) use of insulin: Secondary | ICD-10-CM | POA: Diagnosis not present

## 2015-09-02 DIAGNOSIS — J449 Chronic obstructive pulmonary disease, unspecified: Secondary | ICD-10-CM | POA: Diagnosis not present

## 2015-09-02 DIAGNOSIS — F17210 Nicotine dependence, cigarettes, uncomplicated: Secondary | ICD-10-CM | POA: Diagnosis not present

## 2015-09-02 DIAGNOSIS — I4892 Unspecified atrial flutter: Secondary | ICD-10-CM | POA: Diagnosis not present

## 2015-09-02 DIAGNOSIS — I10 Essential (primary) hypertension: Secondary | ICD-10-CM | POA: Diagnosis not present

## 2015-09-02 DIAGNOSIS — Z4781 Encounter for orthopedic aftercare following surgical amputation: Secondary | ICD-10-CM | POA: Diagnosis not present

## 2015-09-02 DIAGNOSIS — E119 Type 2 diabetes mellitus without complications: Secondary | ICD-10-CM | POA: Diagnosis not present

## 2015-09-02 DIAGNOSIS — Z89511 Acquired absence of right leg below knee: Secondary | ICD-10-CM | POA: Diagnosis not present

## 2015-09-02 DIAGNOSIS — E1140 Type 2 diabetes mellitus with diabetic neuropathy, unspecified: Secondary | ICD-10-CM | POA: Diagnosis not present

## 2015-09-03 DIAGNOSIS — I10 Essential (primary) hypertension: Secondary | ICD-10-CM | POA: Diagnosis not present

## 2015-09-03 DIAGNOSIS — I4892 Unspecified atrial flutter: Secondary | ICD-10-CM | POA: Diagnosis not present

## 2015-09-03 DIAGNOSIS — F17210 Nicotine dependence, cigarettes, uncomplicated: Secondary | ICD-10-CM | POA: Diagnosis not present

## 2015-09-03 DIAGNOSIS — Z794 Long term (current) use of insulin: Secondary | ICD-10-CM | POA: Diagnosis not present

## 2015-09-03 DIAGNOSIS — R69 Illness, unspecified: Secondary | ICD-10-CM | POA: Diagnosis not present

## 2015-09-03 DIAGNOSIS — J449 Chronic obstructive pulmonary disease, unspecified: Secondary | ICD-10-CM | POA: Diagnosis not present

## 2015-09-03 DIAGNOSIS — E119 Type 2 diabetes mellitus without complications: Secondary | ICD-10-CM | POA: Diagnosis not present

## 2015-09-03 DIAGNOSIS — I739 Peripheral vascular disease, unspecified: Secondary | ICD-10-CM | POA: Diagnosis not present

## 2015-09-03 DIAGNOSIS — E1140 Type 2 diabetes mellitus with diabetic neuropathy, unspecified: Secondary | ICD-10-CM | POA: Diagnosis not present

## 2015-09-03 DIAGNOSIS — Z4781 Encounter for orthopedic aftercare following surgical amputation: Secondary | ICD-10-CM | POA: Diagnosis not present

## 2015-09-03 DIAGNOSIS — Z89511 Acquired absence of right leg below knee: Secondary | ICD-10-CM | POA: Diagnosis not present

## 2015-09-05 DIAGNOSIS — E119 Type 2 diabetes mellitus without complications: Secondary | ICD-10-CM | POA: Diagnosis not present

## 2015-09-05 DIAGNOSIS — K3184 Gastroparesis: Secondary | ICD-10-CM | POA: Diagnosis not present

## 2015-09-05 DIAGNOSIS — I2510 Atherosclerotic heart disease of native coronary artery without angina pectoris: Secondary | ICD-10-CM | POA: Diagnosis not present

## 2015-09-05 DIAGNOSIS — E1143 Type 2 diabetes mellitus with diabetic autonomic (poly)neuropathy: Secondary | ICD-10-CM | POA: Diagnosis not present

## 2015-09-05 DIAGNOSIS — E785 Hyperlipidemia, unspecified: Secondary | ICD-10-CM | POA: Diagnosis not present

## 2015-09-05 DIAGNOSIS — Z951 Presence of aortocoronary bypass graft: Secondary | ICD-10-CM | POA: Diagnosis not present

## 2015-09-05 DIAGNOSIS — T8789 Other complications of amputation stump: Secondary | ICD-10-CM | POA: Diagnosis not present

## 2015-09-05 DIAGNOSIS — E1151 Type 2 diabetes mellitus with diabetic peripheral angiopathy without gangrene: Secondary | ICD-10-CM | POA: Diagnosis not present

## 2015-09-05 DIAGNOSIS — I70201 Unspecified atherosclerosis of native arteries of extremities, right leg: Secondary | ICD-10-CM | POA: Diagnosis not present

## 2015-09-05 DIAGNOSIS — T8753 Necrosis of amputation stump, right lower extremity: Secondary | ICD-10-CM | POA: Diagnosis not present

## 2015-09-05 DIAGNOSIS — S88121A Partial traumatic amputation at level between knee and ankle, right lower leg, initial encounter: Secondary | ICD-10-CM | POA: Diagnosis not present

## 2015-09-05 DIAGNOSIS — R69 Illness, unspecified: Secondary | ICD-10-CM | POA: Diagnosis not present

## 2015-09-05 DIAGNOSIS — I739 Peripheral vascular disease, unspecified: Secondary | ICD-10-CM | POA: Diagnosis not present

## 2015-09-05 DIAGNOSIS — I70231 Atherosclerosis of native arteries of right leg with ulceration of thigh: Secondary | ICD-10-CM | POA: Diagnosis not present

## 2015-09-05 DIAGNOSIS — I10 Essential (primary) hypertension: Secondary | ICD-10-CM | POA: Diagnosis not present

## 2015-09-05 DIAGNOSIS — Z89511 Acquired absence of right leg below knee: Secondary | ICD-10-CM | POA: Diagnosis not present

## 2015-09-21 DIAGNOSIS — Z89511 Acquired absence of right leg below knee: Secondary | ICD-10-CM | POA: Diagnosis not present

## 2015-09-21 DIAGNOSIS — I4892 Unspecified atrial flutter: Secondary | ICD-10-CM | POA: Diagnosis not present

## 2015-09-21 DIAGNOSIS — E1140 Type 2 diabetes mellitus with diabetic neuropathy, unspecified: Secondary | ICD-10-CM | POA: Diagnosis not present

## 2015-09-21 DIAGNOSIS — R69 Illness, unspecified: Secondary | ICD-10-CM | POA: Diagnosis not present

## 2015-09-21 DIAGNOSIS — I739 Peripheral vascular disease, unspecified: Secondary | ICD-10-CM | POA: Diagnosis not present

## 2015-09-21 DIAGNOSIS — I10 Essential (primary) hypertension: Secondary | ICD-10-CM | POA: Diagnosis not present

## 2015-09-21 DIAGNOSIS — F17210 Nicotine dependence, cigarettes, uncomplicated: Secondary | ICD-10-CM | POA: Diagnosis not present

## 2015-09-21 DIAGNOSIS — J449 Chronic obstructive pulmonary disease, unspecified: Secondary | ICD-10-CM | POA: Diagnosis not present

## 2015-09-21 DIAGNOSIS — Z4781 Encounter for orthopedic aftercare following surgical amputation: Secondary | ICD-10-CM | POA: Diagnosis not present

## 2015-09-21 DIAGNOSIS — Z794 Long term (current) use of insulin: Secondary | ICD-10-CM | POA: Diagnosis not present

## 2015-09-22 DIAGNOSIS — J449 Chronic obstructive pulmonary disease, unspecified: Secondary | ICD-10-CM | POA: Diagnosis not present

## 2015-09-24 DIAGNOSIS — Z4801 Encounter for change or removal of surgical wound dressing: Secondary | ICD-10-CM | POA: Diagnosis not present

## 2015-09-25 DIAGNOSIS — I739 Peripheral vascular disease, unspecified: Secondary | ICD-10-CM | POA: Diagnosis not present

## 2015-09-25 DIAGNOSIS — F17210 Nicotine dependence, cigarettes, uncomplicated: Secondary | ICD-10-CM | POA: Diagnosis not present

## 2015-09-25 DIAGNOSIS — E1140 Type 2 diabetes mellitus with diabetic neuropathy, unspecified: Secondary | ICD-10-CM | POA: Diagnosis not present

## 2015-09-25 DIAGNOSIS — Z4781 Encounter for orthopedic aftercare following surgical amputation: Secondary | ICD-10-CM | POA: Diagnosis not present

## 2015-09-25 DIAGNOSIS — Z79899 Other long term (current) drug therapy: Secondary | ICD-10-CM | POA: Diagnosis not present

## 2015-09-25 DIAGNOSIS — R69 Illness, unspecified: Secondary | ICD-10-CM | POA: Diagnosis not present

## 2015-09-25 DIAGNOSIS — Z794 Long term (current) use of insulin: Secondary | ICD-10-CM | POA: Diagnosis not present

## 2015-09-25 DIAGNOSIS — I10 Essential (primary) hypertension: Secondary | ICD-10-CM | POA: Diagnosis not present

## 2015-09-25 DIAGNOSIS — I4892 Unspecified atrial flutter: Secondary | ICD-10-CM | POA: Diagnosis not present

## 2015-09-25 DIAGNOSIS — J449 Chronic obstructive pulmonary disease, unspecified: Secondary | ICD-10-CM | POA: Diagnosis not present

## 2015-09-25 DIAGNOSIS — Z89511 Acquired absence of right leg below knee: Secondary | ICD-10-CM | POA: Diagnosis not present

## 2015-09-27 DIAGNOSIS — E1140 Type 2 diabetes mellitus with diabetic neuropathy, unspecified: Secondary | ICD-10-CM | POA: Diagnosis not present

## 2015-09-27 DIAGNOSIS — I4892 Unspecified atrial flutter: Secondary | ICD-10-CM | POA: Diagnosis not present

## 2015-09-27 DIAGNOSIS — Z4781 Encounter for orthopedic aftercare following surgical amputation: Secondary | ICD-10-CM | POA: Diagnosis not present

## 2015-09-27 DIAGNOSIS — I10 Essential (primary) hypertension: Secondary | ICD-10-CM | POA: Diagnosis not present

## 2015-09-27 DIAGNOSIS — I739 Peripheral vascular disease, unspecified: Secondary | ICD-10-CM | POA: Diagnosis not present

## 2015-09-27 DIAGNOSIS — R69 Illness, unspecified: Secondary | ICD-10-CM | POA: Diagnosis not present

## 2015-09-27 DIAGNOSIS — J449 Chronic obstructive pulmonary disease, unspecified: Secondary | ICD-10-CM | POA: Diagnosis not present

## 2015-09-27 DIAGNOSIS — F17210 Nicotine dependence, cigarettes, uncomplicated: Secondary | ICD-10-CM | POA: Diagnosis not present

## 2015-09-27 DIAGNOSIS — Z89511 Acquired absence of right leg below knee: Secondary | ICD-10-CM | POA: Diagnosis not present

## 2015-09-27 DIAGNOSIS — Z794 Long term (current) use of insulin: Secondary | ICD-10-CM | POA: Diagnosis not present

## 2015-09-30 DIAGNOSIS — Z4781 Encounter for orthopedic aftercare following surgical amputation: Secondary | ICD-10-CM | POA: Diagnosis not present

## 2015-09-30 DIAGNOSIS — Z89511 Acquired absence of right leg below knee: Secondary | ICD-10-CM | POA: Diagnosis not present

## 2015-09-30 DIAGNOSIS — I10 Essential (primary) hypertension: Secondary | ICD-10-CM | POA: Diagnosis not present

## 2015-09-30 DIAGNOSIS — J449 Chronic obstructive pulmonary disease, unspecified: Secondary | ICD-10-CM | POA: Diagnosis not present

## 2015-09-30 DIAGNOSIS — I739 Peripheral vascular disease, unspecified: Secondary | ICD-10-CM | POA: Diagnosis not present

## 2015-09-30 DIAGNOSIS — R69 Illness, unspecified: Secondary | ICD-10-CM | POA: Diagnosis not present

## 2015-09-30 DIAGNOSIS — Z794 Long term (current) use of insulin: Secondary | ICD-10-CM | POA: Diagnosis not present

## 2015-09-30 DIAGNOSIS — E1140 Type 2 diabetes mellitus with diabetic neuropathy, unspecified: Secondary | ICD-10-CM | POA: Diagnosis not present

## 2015-09-30 DIAGNOSIS — I4892 Unspecified atrial flutter: Secondary | ICD-10-CM | POA: Diagnosis not present

## 2015-09-30 DIAGNOSIS — F17210 Nicotine dependence, cigarettes, uncomplicated: Secondary | ICD-10-CM | POA: Diagnosis not present

## 2015-10-01 DIAGNOSIS — Z4781 Encounter for orthopedic aftercare following surgical amputation: Secondary | ICD-10-CM | POA: Diagnosis not present

## 2015-10-01 DIAGNOSIS — Z89511 Acquired absence of right leg below knee: Secondary | ICD-10-CM | POA: Diagnosis not present

## 2015-10-01 DIAGNOSIS — E1140 Type 2 diabetes mellitus with diabetic neuropathy, unspecified: Secondary | ICD-10-CM | POA: Diagnosis not present

## 2015-10-01 DIAGNOSIS — I739 Peripheral vascular disease, unspecified: Secondary | ICD-10-CM | POA: Diagnosis not present

## 2015-10-01 DIAGNOSIS — I4892 Unspecified atrial flutter: Secondary | ICD-10-CM | POA: Diagnosis not present

## 2015-10-01 DIAGNOSIS — F17210 Nicotine dependence, cigarettes, uncomplicated: Secondary | ICD-10-CM | POA: Diagnosis not present

## 2015-10-01 DIAGNOSIS — I10 Essential (primary) hypertension: Secondary | ICD-10-CM | POA: Diagnosis not present

## 2015-10-01 DIAGNOSIS — J449 Chronic obstructive pulmonary disease, unspecified: Secondary | ICD-10-CM | POA: Diagnosis not present

## 2015-10-01 DIAGNOSIS — R69 Illness, unspecified: Secondary | ICD-10-CM | POA: Diagnosis not present

## 2015-10-01 DIAGNOSIS — Z794 Long term (current) use of insulin: Secondary | ICD-10-CM | POA: Diagnosis not present

## 2015-10-04 DIAGNOSIS — J449 Chronic obstructive pulmonary disease, unspecified: Secondary | ICD-10-CM | POA: Diagnosis not present

## 2015-10-04 DIAGNOSIS — I739 Peripheral vascular disease, unspecified: Secondary | ICD-10-CM | POA: Diagnosis not present

## 2015-10-04 DIAGNOSIS — I10 Essential (primary) hypertension: Secondary | ICD-10-CM | POA: Diagnosis not present

## 2015-10-04 DIAGNOSIS — Z89511 Acquired absence of right leg below knee: Secondary | ICD-10-CM | POA: Diagnosis not present

## 2015-10-04 DIAGNOSIS — F17210 Nicotine dependence, cigarettes, uncomplicated: Secondary | ICD-10-CM | POA: Diagnosis not present

## 2015-10-04 DIAGNOSIS — R69 Illness, unspecified: Secondary | ICD-10-CM | POA: Diagnosis not present

## 2015-10-04 DIAGNOSIS — I4892 Unspecified atrial flutter: Secondary | ICD-10-CM | POA: Diagnosis not present

## 2015-10-04 DIAGNOSIS — Z4781 Encounter for orthopedic aftercare following surgical amputation: Secondary | ICD-10-CM | POA: Diagnosis not present

## 2015-10-04 DIAGNOSIS — E1140 Type 2 diabetes mellitus with diabetic neuropathy, unspecified: Secondary | ICD-10-CM | POA: Diagnosis not present

## 2015-10-04 DIAGNOSIS — Z794 Long term (current) use of insulin: Secondary | ICD-10-CM | POA: Diagnosis not present

## 2015-10-08 DIAGNOSIS — I4892 Unspecified atrial flutter: Secondary | ICD-10-CM | POA: Diagnosis not present

## 2015-10-08 DIAGNOSIS — Z4781 Encounter for orthopedic aftercare following surgical amputation: Secondary | ICD-10-CM | POA: Diagnosis not present

## 2015-10-08 DIAGNOSIS — J449 Chronic obstructive pulmonary disease, unspecified: Secondary | ICD-10-CM | POA: Diagnosis not present

## 2015-10-08 DIAGNOSIS — Z89511 Acquired absence of right leg below knee: Secondary | ICD-10-CM | POA: Diagnosis not present

## 2015-10-08 DIAGNOSIS — Z794 Long term (current) use of insulin: Secondary | ICD-10-CM | POA: Diagnosis not present

## 2015-10-08 DIAGNOSIS — R69 Illness, unspecified: Secondary | ICD-10-CM | POA: Diagnosis not present

## 2015-10-08 DIAGNOSIS — F17210 Nicotine dependence, cigarettes, uncomplicated: Secondary | ICD-10-CM | POA: Diagnosis not present

## 2015-10-08 DIAGNOSIS — I739 Peripheral vascular disease, unspecified: Secondary | ICD-10-CM | POA: Diagnosis not present

## 2015-10-08 DIAGNOSIS — E1140 Type 2 diabetes mellitus with diabetic neuropathy, unspecified: Secondary | ICD-10-CM | POA: Diagnosis not present

## 2015-10-08 DIAGNOSIS — I10 Essential (primary) hypertension: Secondary | ICD-10-CM | POA: Diagnosis not present

## 2015-10-09 DIAGNOSIS — I4892 Unspecified atrial flutter: Secondary | ICD-10-CM | POA: Diagnosis not present

## 2015-10-09 DIAGNOSIS — Z4781 Encounter for orthopedic aftercare following surgical amputation: Secondary | ICD-10-CM | POA: Diagnosis not present

## 2015-10-09 DIAGNOSIS — F17210 Nicotine dependence, cigarettes, uncomplicated: Secondary | ICD-10-CM | POA: Diagnosis not present

## 2015-10-09 DIAGNOSIS — E1140 Type 2 diabetes mellitus with diabetic neuropathy, unspecified: Secondary | ICD-10-CM | POA: Diagnosis not present

## 2015-10-09 DIAGNOSIS — I739 Peripheral vascular disease, unspecified: Secondary | ICD-10-CM | POA: Diagnosis not present

## 2015-10-09 DIAGNOSIS — Z794 Long term (current) use of insulin: Secondary | ICD-10-CM | POA: Diagnosis not present

## 2015-10-09 DIAGNOSIS — J449 Chronic obstructive pulmonary disease, unspecified: Secondary | ICD-10-CM | POA: Diagnosis not present

## 2015-10-09 DIAGNOSIS — Z89511 Acquired absence of right leg below knee: Secondary | ICD-10-CM | POA: Diagnosis not present

## 2015-10-09 DIAGNOSIS — I10 Essential (primary) hypertension: Secondary | ICD-10-CM | POA: Diagnosis not present

## 2015-10-09 DIAGNOSIS — R69 Illness, unspecified: Secondary | ICD-10-CM | POA: Diagnosis not present

## 2015-10-11 DIAGNOSIS — Z794 Long term (current) use of insulin: Secondary | ICD-10-CM | POA: Diagnosis not present

## 2015-10-11 DIAGNOSIS — Z89511 Acquired absence of right leg below knee: Secondary | ICD-10-CM | POA: Diagnosis not present

## 2015-10-11 DIAGNOSIS — E1140 Type 2 diabetes mellitus with diabetic neuropathy, unspecified: Secondary | ICD-10-CM | POA: Diagnosis not present

## 2015-10-11 DIAGNOSIS — Z4781 Encounter for orthopedic aftercare following surgical amputation: Secondary | ICD-10-CM | POA: Diagnosis not present

## 2015-10-11 DIAGNOSIS — I10 Essential (primary) hypertension: Secondary | ICD-10-CM | POA: Diagnosis not present

## 2015-10-11 DIAGNOSIS — J449 Chronic obstructive pulmonary disease, unspecified: Secondary | ICD-10-CM | POA: Diagnosis not present

## 2015-10-11 DIAGNOSIS — I4892 Unspecified atrial flutter: Secondary | ICD-10-CM | POA: Diagnosis not present

## 2015-10-11 DIAGNOSIS — R69 Illness, unspecified: Secondary | ICD-10-CM | POA: Diagnosis not present

## 2015-10-11 DIAGNOSIS — I739 Peripheral vascular disease, unspecified: Secondary | ICD-10-CM | POA: Diagnosis not present

## 2015-10-11 DIAGNOSIS — F17210 Nicotine dependence, cigarettes, uncomplicated: Secondary | ICD-10-CM | POA: Diagnosis not present

## 2015-10-14 DIAGNOSIS — Z4801 Encounter for change or removal of surgical wound dressing: Secondary | ICD-10-CM | POA: Diagnosis not present

## 2015-10-17 DIAGNOSIS — R69 Illness, unspecified: Secondary | ICD-10-CM | POA: Diagnosis not present

## 2015-10-17 DIAGNOSIS — Z89611 Acquired absence of right leg above knee: Secondary | ICD-10-CM | POA: Diagnosis not present

## 2015-10-17 DIAGNOSIS — R2689 Other abnormalities of gait and mobility: Secondary | ICD-10-CM | POA: Diagnosis not present

## 2015-10-17 DIAGNOSIS — J449 Chronic obstructive pulmonary disease, unspecified: Secondary | ICD-10-CM | POA: Diagnosis not present

## 2015-10-17 DIAGNOSIS — E1151 Type 2 diabetes mellitus with diabetic peripheral angiopathy without gangrene: Secondary | ICD-10-CM | POA: Diagnosis not present

## 2015-10-17 DIAGNOSIS — I10 Essential (primary) hypertension: Secondary | ICD-10-CM | POA: Diagnosis not present

## 2015-10-17 DIAGNOSIS — I4892 Unspecified atrial flutter: Secondary | ICD-10-CM | POA: Diagnosis not present

## 2015-10-17 DIAGNOSIS — F17210 Nicotine dependence, cigarettes, uncomplicated: Secondary | ICD-10-CM | POA: Diagnosis not present

## 2015-10-17 DIAGNOSIS — Z4781 Encounter for orthopedic aftercare following surgical amputation: Secondary | ICD-10-CM | POA: Diagnosis not present

## 2015-10-18 DIAGNOSIS — I10 Essential (primary) hypertension: Secondary | ICD-10-CM | POA: Diagnosis not present

## 2015-10-18 DIAGNOSIS — I4892 Unspecified atrial flutter: Secondary | ICD-10-CM | POA: Diagnosis not present

## 2015-10-18 DIAGNOSIS — J449 Chronic obstructive pulmonary disease, unspecified: Secondary | ICD-10-CM | POA: Diagnosis not present

## 2015-10-18 DIAGNOSIS — F17210 Nicotine dependence, cigarettes, uncomplicated: Secondary | ICD-10-CM | POA: Diagnosis not present

## 2015-10-18 DIAGNOSIS — E1151 Type 2 diabetes mellitus with diabetic peripheral angiopathy without gangrene: Secondary | ICD-10-CM | POA: Diagnosis not present

## 2015-10-18 DIAGNOSIS — Z4781 Encounter for orthopedic aftercare following surgical amputation: Secondary | ICD-10-CM | POA: Diagnosis not present

## 2015-10-18 DIAGNOSIS — R69 Illness, unspecified: Secondary | ICD-10-CM | POA: Diagnosis not present

## 2015-10-18 DIAGNOSIS — Z89611 Acquired absence of right leg above knee: Secondary | ICD-10-CM | POA: Diagnosis not present

## 2015-10-18 DIAGNOSIS — R2689 Other abnormalities of gait and mobility: Secondary | ICD-10-CM | POA: Diagnosis not present

## 2015-10-21 DIAGNOSIS — R69 Illness, unspecified: Secondary | ICD-10-CM | POA: Diagnosis not present

## 2015-10-21 DIAGNOSIS — J449 Chronic obstructive pulmonary disease, unspecified: Secondary | ICD-10-CM | POA: Diagnosis not present

## 2015-10-21 DIAGNOSIS — E1151 Type 2 diabetes mellitus with diabetic peripheral angiopathy without gangrene: Secondary | ICD-10-CM | POA: Diagnosis not present

## 2015-10-21 DIAGNOSIS — I4892 Unspecified atrial flutter: Secondary | ICD-10-CM | POA: Diagnosis not present

## 2015-10-21 DIAGNOSIS — R2689 Other abnormalities of gait and mobility: Secondary | ICD-10-CM | POA: Diagnosis not present

## 2015-10-21 DIAGNOSIS — I10 Essential (primary) hypertension: Secondary | ICD-10-CM | POA: Diagnosis not present

## 2015-10-21 DIAGNOSIS — Z4781 Encounter for orthopedic aftercare following surgical amputation: Secondary | ICD-10-CM | POA: Diagnosis not present

## 2015-10-21 DIAGNOSIS — Z89611 Acquired absence of right leg above knee: Secondary | ICD-10-CM | POA: Diagnosis not present

## 2015-10-21 DIAGNOSIS — F17210 Nicotine dependence, cigarettes, uncomplicated: Secondary | ICD-10-CM | POA: Diagnosis not present

## 2015-10-22 DIAGNOSIS — J449 Chronic obstructive pulmonary disease, unspecified: Secondary | ICD-10-CM | POA: Diagnosis not present

## 2015-10-22 DIAGNOSIS — R2689 Other abnormalities of gait and mobility: Secondary | ICD-10-CM | POA: Diagnosis not present

## 2015-10-22 DIAGNOSIS — F17210 Nicotine dependence, cigarettes, uncomplicated: Secondary | ICD-10-CM | POA: Diagnosis not present

## 2015-10-22 DIAGNOSIS — I4892 Unspecified atrial flutter: Secondary | ICD-10-CM | POA: Diagnosis not present

## 2015-10-22 DIAGNOSIS — I10 Essential (primary) hypertension: Secondary | ICD-10-CM | POA: Diagnosis not present

## 2015-10-22 DIAGNOSIS — Z89611 Acquired absence of right leg above knee: Secondary | ICD-10-CM | POA: Diagnosis not present

## 2015-10-22 DIAGNOSIS — E1151 Type 2 diabetes mellitus with diabetic peripheral angiopathy without gangrene: Secondary | ICD-10-CM | POA: Diagnosis not present

## 2015-10-22 DIAGNOSIS — Z4781 Encounter for orthopedic aftercare following surgical amputation: Secondary | ICD-10-CM | POA: Diagnosis not present

## 2015-10-22 DIAGNOSIS — R69 Illness, unspecified: Secondary | ICD-10-CM | POA: Diagnosis not present

## 2015-10-23 DIAGNOSIS — J449 Chronic obstructive pulmonary disease, unspecified: Secondary | ICD-10-CM | POA: Diagnosis not present

## 2015-10-23 DIAGNOSIS — F17210 Nicotine dependence, cigarettes, uncomplicated: Secondary | ICD-10-CM | POA: Diagnosis not present

## 2015-10-23 DIAGNOSIS — Z89611 Acquired absence of right leg above knee: Secondary | ICD-10-CM | POA: Diagnosis not present

## 2015-10-23 DIAGNOSIS — Z4781 Encounter for orthopedic aftercare following surgical amputation: Secondary | ICD-10-CM | POA: Diagnosis not present

## 2015-10-23 DIAGNOSIS — R2689 Other abnormalities of gait and mobility: Secondary | ICD-10-CM | POA: Diagnosis not present

## 2015-10-23 DIAGNOSIS — R69 Illness, unspecified: Secondary | ICD-10-CM | POA: Diagnosis not present

## 2015-10-23 DIAGNOSIS — I4892 Unspecified atrial flutter: Secondary | ICD-10-CM | POA: Diagnosis not present

## 2015-10-23 DIAGNOSIS — I10 Essential (primary) hypertension: Secondary | ICD-10-CM | POA: Diagnosis not present

## 2015-10-23 DIAGNOSIS — E1151 Type 2 diabetes mellitus with diabetic peripheral angiopathy without gangrene: Secondary | ICD-10-CM | POA: Diagnosis not present

## 2015-11-04 DIAGNOSIS — F17210 Nicotine dependence, cigarettes, uncomplicated: Secondary | ICD-10-CM | POA: Diagnosis not present

## 2015-11-04 DIAGNOSIS — I4892 Unspecified atrial flutter: Secondary | ICD-10-CM | POA: Diagnosis not present

## 2015-11-04 DIAGNOSIS — I10 Essential (primary) hypertension: Secondary | ICD-10-CM | POA: Diagnosis not present

## 2015-11-04 DIAGNOSIS — R2689 Other abnormalities of gait and mobility: Secondary | ICD-10-CM | POA: Diagnosis not present

## 2015-11-04 DIAGNOSIS — R69 Illness, unspecified: Secondary | ICD-10-CM | POA: Diagnosis not present

## 2015-11-04 DIAGNOSIS — Z4781 Encounter for orthopedic aftercare following surgical amputation: Secondary | ICD-10-CM | POA: Diagnosis not present

## 2015-11-04 DIAGNOSIS — E1151 Type 2 diabetes mellitus with diabetic peripheral angiopathy without gangrene: Secondary | ICD-10-CM | POA: Diagnosis not present

## 2015-11-04 DIAGNOSIS — J449 Chronic obstructive pulmonary disease, unspecified: Secondary | ICD-10-CM | POA: Diagnosis not present

## 2015-11-04 DIAGNOSIS — Z89611 Acquired absence of right leg above knee: Secondary | ICD-10-CM | POA: Diagnosis not present

## 2015-11-05 DIAGNOSIS — I10 Essential (primary) hypertension: Secondary | ICD-10-CM | POA: Diagnosis not present

## 2015-11-05 DIAGNOSIS — Z89611 Acquired absence of right leg above knee: Secondary | ICD-10-CM | POA: Diagnosis not present

## 2015-11-05 DIAGNOSIS — R69 Illness, unspecified: Secondary | ICD-10-CM | POA: Diagnosis not present

## 2015-11-05 DIAGNOSIS — I4892 Unspecified atrial flutter: Secondary | ICD-10-CM | POA: Diagnosis not present

## 2015-11-05 DIAGNOSIS — F17210 Nicotine dependence, cigarettes, uncomplicated: Secondary | ICD-10-CM | POA: Diagnosis not present

## 2015-11-05 DIAGNOSIS — J449 Chronic obstructive pulmonary disease, unspecified: Secondary | ICD-10-CM | POA: Diagnosis not present

## 2015-11-05 DIAGNOSIS — E1151 Type 2 diabetes mellitus with diabetic peripheral angiopathy without gangrene: Secondary | ICD-10-CM | POA: Diagnosis not present

## 2015-11-05 DIAGNOSIS — R2689 Other abnormalities of gait and mobility: Secondary | ICD-10-CM | POA: Diagnosis not present

## 2015-11-05 DIAGNOSIS — Z4781 Encounter for orthopedic aftercare following surgical amputation: Secondary | ICD-10-CM | POA: Diagnosis not present

## 2015-11-06 DIAGNOSIS — Z89611 Acquired absence of right leg above knee: Secondary | ICD-10-CM | POA: Diagnosis not present

## 2015-11-06 DIAGNOSIS — I10 Essential (primary) hypertension: Secondary | ICD-10-CM | POA: Diagnosis not present

## 2015-11-06 DIAGNOSIS — I4892 Unspecified atrial flutter: Secondary | ICD-10-CM | POA: Diagnosis not present

## 2015-11-06 DIAGNOSIS — R2689 Other abnormalities of gait and mobility: Secondary | ICD-10-CM | POA: Diagnosis not present

## 2015-11-06 DIAGNOSIS — R69 Illness, unspecified: Secondary | ICD-10-CM | POA: Diagnosis not present

## 2015-11-06 DIAGNOSIS — J449 Chronic obstructive pulmonary disease, unspecified: Secondary | ICD-10-CM | POA: Diagnosis not present

## 2015-11-06 DIAGNOSIS — F17210 Nicotine dependence, cigarettes, uncomplicated: Secondary | ICD-10-CM | POA: Diagnosis not present

## 2015-11-06 DIAGNOSIS — Z4781 Encounter for orthopedic aftercare following surgical amputation: Secondary | ICD-10-CM | POA: Diagnosis not present

## 2015-11-06 DIAGNOSIS — E1151 Type 2 diabetes mellitus with diabetic peripheral angiopathy without gangrene: Secondary | ICD-10-CM | POA: Diagnosis not present

## 2015-11-21 DIAGNOSIS — M5441 Lumbago with sciatica, right side: Secondary | ICD-10-CM | POA: Diagnosis not present

## 2015-11-21 DIAGNOSIS — R51 Headache: Secondary | ICD-10-CM | POA: Diagnosis not present

## 2015-11-21 DIAGNOSIS — M5481 Occipital neuralgia: Secondary | ICD-10-CM | POA: Insufficient documentation

## 2015-11-21 DIAGNOSIS — J449 Chronic obstructive pulmonary disease, unspecified: Secondary | ICD-10-CM | POA: Insufficient documentation

## 2015-11-21 DIAGNOSIS — G4486 Cervicogenic headache: Secondary | ICD-10-CM | POA: Insufficient documentation

## 2015-11-21 DIAGNOSIS — M5417 Radiculopathy, lumbosacral region: Secondary | ICD-10-CM | POA: Diagnosis not present

## 2015-11-21 DIAGNOSIS — M542 Cervicalgia: Secondary | ICD-10-CM | POA: Diagnosis not present

## 2015-11-21 DIAGNOSIS — G894 Chronic pain syndrome: Secondary | ICD-10-CM | POA: Diagnosis not present

## 2015-11-21 DIAGNOSIS — G8929 Other chronic pain: Secondary | ICD-10-CM | POA: Diagnosis not present

## 2015-11-21 DIAGNOSIS — M5442 Lumbago with sciatica, left side: Secondary | ICD-10-CM | POA: Diagnosis not present

## 2015-11-22 DIAGNOSIS — J449 Chronic obstructive pulmonary disease, unspecified: Secondary | ICD-10-CM | POA: Diagnosis not present

## 2015-11-25 DIAGNOSIS — I252 Old myocardial infarction: Secondary | ICD-10-CM | POA: Diagnosis not present

## 2015-11-25 DIAGNOSIS — E1140 Type 2 diabetes mellitus with diabetic neuropathy, unspecified: Secondary | ICD-10-CM | POA: Diagnosis not present

## 2015-11-25 DIAGNOSIS — Z89611 Acquired absence of right leg above knee: Secondary | ICD-10-CM | POA: Diagnosis not present

## 2015-11-25 DIAGNOSIS — E785 Hyperlipidemia, unspecified: Secondary | ICD-10-CM | POA: Diagnosis not present

## 2015-11-25 DIAGNOSIS — I2510 Atherosclerotic heart disease of native coronary artery without angina pectoris: Secondary | ICD-10-CM | POA: Diagnosis not present

## 2015-11-25 DIAGNOSIS — I739 Peripheral vascular disease, unspecified: Secondary | ICD-10-CM | POA: Diagnosis not present

## 2015-11-25 DIAGNOSIS — Z951 Presence of aortocoronary bypass graft: Secondary | ICD-10-CM | POA: Diagnosis not present

## 2015-11-25 DIAGNOSIS — E118 Type 2 diabetes mellitus with unspecified complications: Secondary | ICD-10-CM | POA: Diagnosis not present

## 2015-11-25 DIAGNOSIS — I10 Essential (primary) hypertension: Secondary | ICD-10-CM | POA: Diagnosis not present

## 2015-12-02 DIAGNOSIS — Z89611 Acquired absence of right leg above knee: Secondary | ICD-10-CM | POA: Diagnosis not present

## 2015-12-19 DIAGNOSIS — M5127 Other intervertebral disc displacement, lumbosacral region: Secondary | ICD-10-CM | POA: Insufficient documentation

## 2015-12-20 DIAGNOSIS — R51 Headache: Secondary | ICD-10-CM | POA: Diagnosis not present

## 2015-12-20 DIAGNOSIS — G8929 Other chronic pain: Secondary | ICD-10-CM | POA: Diagnosis not present

## 2015-12-20 DIAGNOSIS — M5442 Lumbago with sciatica, left side: Secondary | ICD-10-CM | POA: Diagnosis not present

## 2015-12-20 DIAGNOSIS — M5417 Radiculopathy, lumbosacral region: Secondary | ICD-10-CM | POA: Diagnosis not present

## 2015-12-20 DIAGNOSIS — M5441 Lumbago with sciatica, right side: Secondary | ICD-10-CM | POA: Diagnosis not present

## 2015-12-20 DIAGNOSIS — G894 Chronic pain syndrome: Secondary | ICD-10-CM | POA: Diagnosis not present

## 2015-12-20 DIAGNOSIS — M542 Cervicalgia: Secondary | ICD-10-CM | POA: Diagnosis not present

## 2015-12-20 DIAGNOSIS — M5127 Other intervertebral disc displacement, lumbosacral region: Secondary | ICD-10-CM | POA: Diagnosis not present

## 2015-12-22 DIAGNOSIS — J449 Chronic obstructive pulmonary disease, unspecified: Secondary | ICD-10-CM | POA: Diagnosis not present

## 2016-01-09 DIAGNOSIS — R509 Fever, unspecified: Secondary | ICD-10-CM | POA: Diagnosis not present

## 2016-01-09 DIAGNOSIS — D72829 Elevated white blood cell count, unspecified: Secondary | ICD-10-CM | POA: Diagnosis not present

## 2016-01-09 DIAGNOSIS — R69 Illness, unspecified: Secondary | ICD-10-CM | POA: Diagnosis not present

## 2016-01-09 DIAGNOSIS — I2510 Atherosclerotic heart disease of native coronary artery without angina pectoris: Secondary | ICD-10-CM | POA: Diagnosis not present

## 2016-01-09 DIAGNOSIS — R350 Frequency of micturition: Secondary | ICD-10-CM | POA: Diagnosis not present

## 2016-01-09 DIAGNOSIS — I10 Essential (primary) hypertension: Secondary | ICD-10-CM | POA: Diagnosis not present

## 2016-01-09 DIAGNOSIS — E119 Type 2 diabetes mellitus without complications: Secondary | ICD-10-CM | POA: Diagnosis not present

## 2016-01-09 DIAGNOSIS — R109 Unspecified abdominal pain: Secondary | ICD-10-CM | POA: Diagnosis not present

## 2016-01-09 DIAGNOSIS — M545 Low back pain: Secondary | ICD-10-CM | POA: Diagnosis not present

## 2016-01-09 DIAGNOSIS — R5383 Other fatigue: Secondary | ICD-10-CM | POA: Diagnosis not present

## 2016-01-09 DIAGNOSIS — M546 Pain in thoracic spine: Secondary | ICD-10-CM | POA: Diagnosis not present

## 2016-01-22 DIAGNOSIS — J449 Chronic obstructive pulmonary disease, unspecified: Secondary | ICD-10-CM | POA: Diagnosis not present

## 2016-02-18 DIAGNOSIS — G894 Chronic pain syndrome: Secondary | ICD-10-CM | POA: Diagnosis not present

## 2016-02-18 DIAGNOSIS — G8929 Other chronic pain: Secondary | ICD-10-CM | POA: Diagnosis not present

## 2016-02-18 DIAGNOSIS — M5417 Radiculopathy, lumbosacral region: Secondary | ICD-10-CM | POA: Diagnosis not present

## 2016-02-18 DIAGNOSIS — R51 Headache: Secondary | ICD-10-CM | POA: Diagnosis not present

## 2016-02-18 DIAGNOSIS — M542 Cervicalgia: Secondary | ICD-10-CM | POA: Diagnosis not present

## 2016-02-18 DIAGNOSIS — M5127 Other intervertebral disc displacement, lumbosacral region: Secondary | ICD-10-CM | POA: Diagnosis not present

## 2016-02-18 DIAGNOSIS — Z79891 Long term (current) use of opiate analgesic: Secondary | ICD-10-CM | POA: Diagnosis not present

## 2016-02-18 DIAGNOSIS — M5441 Lumbago with sciatica, right side: Secondary | ICD-10-CM | POA: Diagnosis not present

## 2016-02-18 DIAGNOSIS — M5442 Lumbago with sciatica, left side: Secondary | ICD-10-CM | POA: Diagnosis not present

## 2016-02-22 DIAGNOSIS — J449 Chronic obstructive pulmonary disease, unspecified: Secondary | ICD-10-CM | POA: Diagnosis not present

## 2016-03-13 DIAGNOSIS — M5127 Other intervertebral disc displacement, lumbosacral region: Secondary | ICD-10-CM | POA: Diagnosis not present

## 2016-03-13 DIAGNOSIS — G894 Chronic pain syndrome: Secondary | ICD-10-CM | POA: Diagnosis not present

## 2016-03-13 DIAGNOSIS — M542 Cervicalgia: Secondary | ICD-10-CM | POA: Diagnosis not present

## 2016-03-13 DIAGNOSIS — M5417 Radiculopathy, lumbosacral region: Secondary | ICD-10-CM | POA: Diagnosis not present

## 2016-03-13 DIAGNOSIS — R51 Headache: Secondary | ICD-10-CM | POA: Diagnosis not present

## 2016-03-13 DIAGNOSIS — M5481 Occipital neuralgia: Secondary | ICD-10-CM | POA: Diagnosis not present

## 2016-03-13 DIAGNOSIS — G8929 Other chronic pain: Secondary | ICD-10-CM | POA: Diagnosis not present

## 2016-03-13 DIAGNOSIS — M5442 Lumbago with sciatica, left side: Secondary | ICD-10-CM | POA: Diagnosis not present

## 2016-03-13 DIAGNOSIS — M5441 Lumbago with sciatica, right side: Secondary | ICD-10-CM | POA: Diagnosis not present

## 2016-03-23 DIAGNOSIS — J449 Chronic obstructive pulmonary disease, unspecified: Secondary | ICD-10-CM | POA: Diagnosis not present

## 2016-04-23 DIAGNOSIS — J449 Chronic obstructive pulmonary disease, unspecified: Secondary | ICD-10-CM | POA: Diagnosis not present

## 2016-05-01 DIAGNOSIS — Z720 Tobacco use: Secondary | ICD-10-CM | POA: Diagnosis not present

## 2016-05-01 DIAGNOSIS — J189 Pneumonia, unspecified organism: Secondary | ICD-10-CM | POA: Diagnosis not present

## 2016-05-01 DIAGNOSIS — I10 Essential (primary) hypertension: Secondary | ICD-10-CM | POA: Diagnosis not present

## 2016-05-01 DIAGNOSIS — I509 Heart failure, unspecified: Secondary | ICD-10-CM | POA: Diagnosis not present

## 2016-05-01 DIAGNOSIS — R05 Cough: Secondary | ICD-10-CM | POA: Diagnosis not present

## 2016-05-01 DIAGNOSIS — I25810 Atherosclerosis of coronary artery bypass graft(s) without angina pectoris: Secondary | ICD-10-CM | POA: Diagnosis not present

## 2016-05-01 DIAGNOSIS — R918 Other nonspecific abnormal finding of lung field: Secondary | ICD-10-CM | POA: Diagnosis not present

## 2016-05-01 DIAGNOSIS — I252 Old myocardial infarction: Secondary | ICD-10-CM | POA: Diagnosis not present

## 2016-05-01 DIAGNOSIS — J9621 Acute and chronic respiratory failure with hypoxia: Secondary | ICD-10-CM | POA: Diagnosis not present

## 2016-05-01 DIAGNOSIS — R69 Illness, unspecified: Secondary | ICD-10-CM | POA: Diagnosis not present

## 2016-05-01 DIAGNOSIS — Z951 Presence of aortocoronary bypass graft: Secondary | ICD-10-CM | POA: Diagnosis not present

## 2016-05-01 DIAGNOSIS — Z9981 Dependence on supplemental oxygen: Secondary | ICD-10-CM | POA: Diagnosis not present

## 2016-05-01 DIAGNOSIS — I2510 Atherosclerotic heart disease of native coronary artery without angina pectoris: Secondary | ICD-10-CM | POA: Diagnosis not present

## 2016-05-01 DIAGNOSIS — E119 Type 2 diabetes mellitus without complications: Secondary | ICD-10-CM | POA: Diagnosis not present

## 2016-05-01 DIAGNOSIS — J440 Chronic obstructive pulmonary disease with acute lower respiratory infection: Secondary | ICD-10-CM | POA: Diagnosis not present

## 2016-05-02 DIAGNOSIS — J9621 Acute and chronic respiratory failure with hypoxia: Secondary | ICD-10-CM | POA: Diagnosis not present

## 2016-05-02 DIAGNOSIS — I509 Heart failure, unspecified: Secondary | ICD-10-CM | POA: Diagnosis not present

## 2016-05-03 DIAGNOSIS — J9621 Acute and chronic respiratory failure with hypoxia: Secondary | ICD-10-CM | POA: Diagnosis not present

## 2016-05-03 DIAGNOSIS — Z9981 Dependence on supplemental oxygen: Secondary | ICD-10-CM | POA: Diagnosis not present

## 2016-05-03 DIAGNOSIS — Z951 Presence of aortocoronary bypass graft: Secondary | ICD-10-CM | POA: Diagnosis not present

## 2016-05-03 DIAGNOSIS — J189 Pneumonia, unspecified organism: Secondary | ICD-10-CM | POA: Diagnosis not present

## 2016-05-03 DIAGNOSIS — J440 Chronic obstructive pulmonary disease with acute lower respiratory infection: Secondary | ICD-10-CM | POA: Diagnosis not present

## 2016-05-03 DIAGNOSIS — I10 Essential (primary) hypertension: Secondary | ICD-10-CM | POA: Diagnosis not present

## 2016-05-03 DIAGNOSIS — I252 Old myocardial infarction: Secondary | ICD-10-CM | POA: Diagnosis not present

## 2016-05-03 DIAGNOSIS — I25810 Atherosclerosis of coronary artery bypass graft(s) without angina pectoris: Secondary | ICD-10-CM | POA: Diagnosis not present

## 2016-05-03 DIAGNOSIS — R69 Illness, unspecified: Secondary | ICD-10-CM | POA: Diagnosis not present

## 2016-05-03 DIAGNOSIS — E119 Type 2 diabetes mellitus without complications: Secondary | ICD-10-CM | POA: Diagnosis not present

## 2016-05-23 DIAGNOSIS — J449 Chronic obstructive pulmonary disease, unspecified: Secondary | ICD-10-CM | POA: Diagnosis not present

## 2016-06-12 DIAGNOSIS — R51 Headache: Secondary | ICD-10-CM | POA: Diagnosis not present

## 2016-06-12 DIAGNOSIS — M5417 Radiculopathy, lumbosacral region: Secondary | ICD-10-CM | POA: Diagnosis not present

## 2016-06-12 DIAGNOSIS — M5441 Lumbago with sciatica, right side: Secondary | ICD-10-CM | POA: Diagnosis not present

## 2016-06-12 DIAGNOSIS — R69 Illness, unspecified: Secondary | ICD-10-CM | POA: Diagnosis not present

## 2016-06-12 DIAGNOSIS — M542 Cervicalgia: Secondary | ICD-10-CM | POA: Diagnosis not present

## 2016-06-12 DIAGNOSIS — M5442 Lumbago with sciatica, left side: Secondary | ICD-10-CM | POA: Diagnosis not present

## 2016-06-12 DIAGNOSIS — G894 Chronic pain syndrome: Secondary | ICD-10-CM | POA: Diagnosis not present

## 2016-06-12 DIAGNOSIS — G8929 Other chronic pain: Secondary | ICD-10-CM | POA: Diagnosis not present

## 2016-06-16 DIAGNOSIS — E1169 Type 2 diabetes mellitus with other specified complication: Secondary | ICD-10-CM | POA: Diagnosis not present

## 2016-06-16 DIAGNOSIS — E1140 Type 2 diabetes mellitus with diabetic neuropathy, unspecified: Secondary | ICD-10-CM | POA: Diagnosis not present

## 2016-06-16 DIAGNOSIS — R0989 Other specified symptoms and signs involving the circulatory and respiratory systems: Secondary | ICD-10-CM | POA: Insufficient documentation

## 2016-06-16 DIAGNOSIS — I2510 Atherosclerotic heart disease of native coronary artery without angina pectoris: Secondary | ICD-10-CM | POA: Diagnosis not present

## 2016-06-16 DIAGNOSIS — Z23 Encounter for immunization: Secondary | ICD-10-CM | POA: Diagnosis not present

## 2016-06-16 DIAGNOSIS — Z794 Long term (current) use of insulin: Secondary | ICD-10-CM | POA: Diagnosis not present

## 2016-06-16 DIAGNOSIS — F410 Panic disorder [episodic paroxysmal anxiety] without agoraphobia: Secondary | ICD-10-CM | POA: Insufficient documentation

## 2016-06-16 DIAGNOSIS — E782 Mixed hyperlipidemia: Secondary | ICD-10-CM | POA: Diagnosis not present

## 2016-06-16 DIAGNOSIS — I739 Peripheral vascular disease, unspecified: Secondary | ICD-10-CM | POA: Diagnosis not present

## 2016-06-23 DIAGNOSIS — J449 Chronic obstructive pulmonary disease, unspecified: Secondary | ICD-10-CM | POA: Diagnosis not present

## 2016-06-24 DIAGNOSIS — R05 Cough: Secondary | ICD-10-CM | POA: Diagnosis not present

## 2016-06-24 DIAGNOSIS — J019 Acute sinusitis, unspecified: Secondary | ICD-10-CM | POA: Diagnosis not present

## 2016-06-26 DIAGNOSIS — R0989 Other specified symptoms and signs involving the circulatory and respiratory systems: Secondary | ICD-10-CM | POA: Diagnosis not present

## 2016-06-26 DIAGNOSIS — I6523 Occlusion and stenosis of bilateral carotid arteries: Secondary | ICD-10-CM | POA: Diagnosis not present

## 2016-07-24 DIAGNOSIS — Z79899 Other long term (current) drug therapy: Secondary | ICD-10-CM | POA: Diagnosis not present

## 2016-07-24 DIAGNOSIS — Z89612 Acquired absence of left leg above knee: Secondary | ICD-10-CM | POA: Diagnosis not present

## 2016-07-24 DIAGNOSIS — E1151 Type 2 diabetes mellitus with diabetic peripheral angiopathy without gangrene: Secondary | ICD-10-CM | POA: Diagnosis not present

## 2016-07-24 DIAGNOSIS — M62838 Other muscle spasm: Secondary | ICD-10-CM | POA: Diagnosis not present

## 2016-07-24 DIAGNOSIS — G546 Phantom limb syndrome with pain: Secondary | ICD-10-CM | POA: Diagnosis not present

## 2016-07-24 DIAGNOSIS — Z6831 Body mass index (BMI) 31.0-31.9, adult: Secondary | ICD-10-CM | POA: Diagnosis not present

## 2016-07-24 DIAGNOSIS — G4700 Insomnia, unspecified: Secondary | ICD-10-CM | POA: Diagnosis not present

## 2016-07-24 DIAGNOSIS — Z955 Presence of coronary angioplasty implant and graft: Secondary | ICD-10-CM | POA: Diagnosis not present

## 2016-07-24 DIAGNOSIS — R002 Palpitations: Secondary | ICD-10-CM | POA: Diagnosis not present

## 2016-07-24 DIAGNOSIS — E1140 Type 2 diabetes mellitus with diabetic neuropathy, unspecified: Secondary | ICD-10-CM | POA: Diagnosis not present

## 2016-07-24 DIAGNOSIS — E7800 Pure hypercholesterolemia, unspecified: Secondary | ICD-10-CM | POA: Diagnosis not present

## 2016-07-24 DIAGNOSIS — Z951 Presence of aortocoronary bypass graft: Secondary | ICD-10-CM | POA: Diagnosis not present

## 2016-07-24 DIAGNOSIS — Z79891 Long term (current) use of opiate analgesic: Secondary | ICD-10-CM | POA: Diagnosis not present

## 2016-07-24 DIAGNOSIS — Z0000 Encounter for general adult medical examination without abnormal findings: Secondary | ICD-10-CM | POA: Diagnosis not present

## 2016-07-24 DIAGNOSIS — E669 Obesity, unspecified: Secondary | ICD-10-CM | POA: Diagnosis not present

## 2016-07-24 DIAGNOSIS — Z794 Long term (current) use of insulin: Secondary | ICD-10-CM | POA: Diagnosis not present

## 2016-07-24 DIAGNOSIS — R69 Illness, unspecified: Secondary | ICD-10-CM | POA: Diagnosis not present

## 2016-08-12 DIAGNOSIS — M5441 Lumbago with sciatica, right side: Secondary | ICD-10-CM | POA: Diagnosis not present

## 2016-08-12 DIAGNOSIS — M5127 Other intervertebral disc displacement, lumbosacral region: Secondary | ICD-10-CM | POA: Diagnosis not present

## 2016-08-12 DIAGNOSIS — M5442 Lumbago with sciatica, left side: Secondary | ICD-10-CM | POA: Diagnosis not present

## 2016-08-12 DIAGNOSIS — G894 Chronic pain syndrome: Secondary | ICD-10-CM | POA: Diagnosis not present

## 2016-08-12 DIAGNOSIS — M5417 Radiculopathy, lumbosacral region: Secondary | ICD-10-CM | POA: Diagnosis not present

## 2016-08-12 DIAGNOSIS — G8929 Other chronic pain: Secondary | ICD-10-CM | POA: Diagnosis not present

## 2016-08-12 DIAGNOSIS — R51 Headache: Secondary | ICD-10-CM | POA: Diagnosis not present

## 2016-09-21 DIAGNOSIS — R69 Illness, unspecified: Secondary | ICD-10-CM | POA: Diagnosis not present

## 2016-09-22 DIAGNOSIS — R69 Illness, unspecified: Secondary | ICD-10-CM | POA: Diagnosis not present

## 2016-10-12 DIAGNOSIS — M5417 Radiculopathy, lumbosacral region: Secondary | ICD-10-CM | POA: Diagnosis not present

## 2016-10-12 DIAGNOSIS — E860 Dehydration: Secondary | ICD-10-CM | POA: Diagnosis not present

## 2016-10-12 DIAGNOSIS — K3184 Gastroparesis: Secondary | ICD-10-CM | POA: Diagnosis not present

## 2016-10-12 DIAGNOSIS — G8929 Other chronic pain: Secondary | ICD-10-CM | POA: Diagnosis not present

## 2016-10-12 DIAGNOSIS — E1143 Type 2 diabetes mellitus with diabetic autonomic (poly)neuropathy: Secondary | ICD-10-CM | POA: Diagnosis not present

## 2016-10-12 DIAGNOSIS — R112 Nausea with vomiting, unspecified: Secondary | ICD-10-CM | POA: Diagnosis not present

## 2016-10-12 DIAGNOSIS — M5127 Other intervertebral disc displacement, lumbosacral region: Secondary | ICD-10-CM | POA: Diagnosis not present

## 2016-10-12 DIAGNOSIS — R51 Headache: Secondary | ICD-10-CM | POA: Diagnosis not present

## 2016-10-12 DIAGNOSIS — G894 Chronic pain syndrome: Secondary | ICD-10-CM | POA: Diagnosis not present

## 2016-10-12 DIAGNOSIS — M5441 Lumbago with sciatica, right side: Secondary | ICD-10-CM | POA: Diagnosis not present

## 2016-10-12 DIAGNOSIS — M542 Cervicalgia: Secondary | ICD-10-CM | POA: Diagnosis not present

## 2016-10-12 DIAGNOSIS — M5442 Lumbago with sciatica, left side: Secondary | ICD-10-CM | POA: Diagnosis not present

## 2016-10-12 DIAGNOSIS — E1165 Type 2 diabetes mellitus with hyperglycemia: Secondary | ICD-10-CM | POA: Diagnosis not present

## 2016-10-12 DIAGNOSIS — N200 Calculus of kidney: Secondary | ICD-10-CM | POA: Diagnosis not present

## 2016-10-12 DIAGNOSIS — R531 Weakness: Secondary | ICD-10-CM | POA: Diagnosis not present

## 2016-10-13 DIAGNOSIS — E1165 Type 2 diabetes mellitus with hyperglycemia: Secondary | ICD-10-CM | POA: Diagnosis not present

## 2016-10-13 DIAGNOSIS — E1143 Type 2 diabetes mellitus with diabetic autonomic (poly)neuropathy: Secondary | ICD-10-CM | POA: Diagnosis not present

## 2016-10-13 DIAGNOSIS — K3184 Gastroparesis: Secondary | ICD-10-CM | POA: Diagnosis not present

## 2016-10-13 DIAGNOSIS — E860 Dehydration: Secondary | ICD-10-CM | POA: Diagnosis not present

## 2016-10-13 DIAGNOSIS — N200 Calculus of kidney: Secondary | ICD-10-CM | POA: Diagnosis not present

## 2016-10-27 DIAGNOSIS — R1084 Generalized abdominal pain: Secondary | ICD-10-CM | POA: Diagnosis not present

## 2016-10-27 DIAGNOSIS — I2510 Atherosclerotic heart disease of native coronary artery without angina pectoris: Secondary | ICD-10-CM | POA: Diagnosis not present

## 2016-10-27 DIAGNOSIS — R112 Nausea with vomiting, unspecified: Secondary | ICD-10-CM | POA: Diagnosis not present

## 2016-10-27 DIAGNOSIS — Z7982 Long term (current) use of aspirin: Secondary | ICD-10-CM | POA: Diagnosis not present

## 2016-10-27 DIAGNOSIS — R1010 Upper abdominal pain, unspecified: Secondary | ICD-10-CM | POA: Diagnosis not present

## 2016-10-27 DIAGNOSIS — Z951 Presence of aortocoronary bypass graft: Secondary | ICD-10-CM | POA: Diagnosis not present

## 2016-10-27 DIAGNOSIS — E119 Type 2 diabetes mellitus without complications: Secondary | ICD-10-CM | POA: Diagnosis not present

## 2016-10-27 DIAGNOSIS — R69 Illness, unspecified: Secondary | ICD-10-CM | POA: Diagnosis not present

## 2016-10-27 DIAGNOSIS — R1110 Vomiting, unspecified: Secondary | ICD-10-CM | POA: Diagnosis not present

## 2016-10-27 DIAGNOSIS — I252 Old myocardial infarction: Secondary | ICD-10-CM | POA: Diagnosis not present

## 2016-10-27 DIAGNOSIS — R109 Unspecified abdominal pain: Secondary | ICD-10-CM | POA: Diagnosis not present

## 2016-10-27 DIAGNOSIS — Z794 Long term (current) use of insulin: Secondary | ICD-10-CM | POA: Diagnosis not present

## 2016-10-30 ENCOUNTER — Emergency Department (HOSPITAL_COMMUNITY)
Admission: EM | Admit: 2016-10-30 | Discharge: 2016-10-30 | Disposition: A | Discharge status: Home / Self Care | Payer: Medicare HMO | Attending: Emergency Medicine | Admitting: Emergency Medicine

## 2016-10-30 ENCOUNTER — Encounter (HOSPITAL_COMMUNITY): Payer: Self-pay

## 2016-10-30 ENCOUNTER — Emergency Department (HOSPITAL_COMMUNITY): Payer: Medicare HMO

## 2016-10-30 DIAGNOSIS — R1010 Upper abdominal pain, unspecified: Secondary | ICD-10-CM | POA: Diagnosis present

## 2016-10-30 DIAGNOSIS — R1084 Generalized abdominal pain: Secondary | ICD-10-CM | POA: Diagnosis not present

## 2016-10-30 DIAGNOSIS — I252 Old myocardial infarction: Secondary | ICD-10-CM | POA: Insufficient documentation

## 2016-10-30 DIAGNOSIS — R69 Illness, unspecified: Secondary | ICD-10-CM | POA: Diagnosis not present

## 2016-10-30 DIAGNOSIS — Z951 Presence of aortocoronary bypass graft: Secondary | ICD-10-CM | POA: Diagnosis not present

## 2016-10-30 DIAGNOSIS — Z794 Long term (current) use of insulin: Secondary | ICD-10-CM | POA: Insufficient documentation

## 2016-10-30 DIAGNOSIS — Z7982 Long term (current) use of aspirin: Secondary | ICD-10-CM | POA: Diagnosis not present

## 2016-10-30 DIAGNOSIS — R1110 Vomiting, unspecified: Secondary | ICD-10-CM | POA: Diagnosis not present

## 2016-10-30 DIAGNOSIS — E119 Type 2 diabetes mellitus without complications: Secondary | ICD-10-CM | POA: Insufficient documentation

## 2016-10-30 DIAGNOSIS — F17210 Nicotine dependence, cigarettes, uncomplicated: Secondary | ICD-10-CM | POA: Insufficient documentation

## 2016-10-30 DIAGNOSIS — I2510 Atherosclerotic heart disease of native coronary artery without angina pectoris: Secondary | ICD-10-CM | POA: Insufficient documentation

## 2016-10-30 DIAGNOSIS — R109 Unspecified abdominal pain: Secondary | ICD-10-CM | POA: Diagnosis not present

## 2016-10-30 DIAGNOSIS — R112 Nausea with vomiting, unspecified: Secondary | ICD-10-CM | POA: Diagnosis not present

## 2016-10-30 LAB — URINALYSIS, ROUTINE W REFLEX MICROSCOPIC
BILIRUBIN URINE: NEGATIVE
Glucose, UA: >=500 mg/dL — AB
Hgb urine dipstick: NEGATIVE
Ketones, ur: 20 mg/dL — AB
LEUKOCYTES UA: NEGATIVE
Nitrite: NEGATIVE
PH: 8 (ref 5.0–8.0)
Protein, ur: >=300 mg/dL — AB
SPECIFIC GRAVITY, URINE: 1.024 (ref 1.005–1.030)

## 2016-10-30 LAB — CBC
HCT: 49.8 % — ABNORMAL HIGH (ref 36.0–46.0)
Hemoglobin: 17.1 g/dL — ABNORMAL HIGH (ref 12.0–15.0)
MCH: 29.6 pg (ref 26.0–34.0)
MCHC: 34.3 g/dL (ref 30.0–36.0)
MCV: 86.3 fL (ref 78.0–100.0)
PLATELETS: 304 10*3/uL (ref 150–400)
RBC: 5.77 MIL/uL — ABNORMAL HIGH (ref 3.87–5.11)
RDW: 15.6 % — ABNORMAL HIGH (ref 11.5–15.5)
WBC: 16.7 10*3/uL — AB (ref 4.0–10.5)

## 2016-10-30 LAB — LIPASE, BLOOD: LIPASE: 14 U/L (ref 11–51)

## 2016-10-30 LAB — COMPREHENSIVE METABOLIC PANEL
ALK PHOS: 119 U/L (ref 38–126)
ALT: 19 U/L (ref 14–54)
AST: 30 U/L (ref 15–41)
Albumin: 3.2 g/dL — ABNORMAL LOW (ref 3.5–5.0)
Anion gap: 16 — ABNORMAL HIGH (ref 5–15)
BILIRUBIN TOTAL: 1.3 mg/dL — AB (ref 0.3–1.2)
BUN: 14 mg/dL (ref 6–20)
CALCIUM: 9.4 mg/dL (ref 8.9–10.3)
CHLORIDE: 98 mmol/L — AB (ref 101–111)
CO2: 23 mmol/L (ref 22–32)
CREATININE: 1.24 mg/dL — AB (ref 0.44–1.00)
GFR calc Af Amer: 54 mL/min — ABNORMAL LOW (ref >60)
GFR, EST NON AFRICAN AMERICAN: 47 mL/min — AB (ref >60)
Glucose, Bld: 393 mg/dL — ABNORMAL HIGH (ref 65–99)
Potassium: 4.7 mmol/L (ref 3.5–5.1)
Sodium: 137 mmol/L (ref 135–145)
Total Protein: 7.4 g/dL (ref 6.5–8.1)

## 2016-10-30 MED ORDER — HALOPERIDOL LACTATE 5 MG/ML IJ SOLN
2.5000 mg | Freq: Once | INTRAMUSCULAR | Status: AC
  Administered 2016-10-30: 2.5 mg via INTRAVENOUS
  Filled 2016-10-30: qty 1

## 2016-10-30 MED ORDER — DIPHENHYDRAMINE HCL 50 MG/ML IJ SOLN
25.0000 mg | Freq: Once | INTRAMUSCULAR | Status: AC
  Administered 2016-10-30: 25 mg via INTRAVENOUS
  Filled 2016-10-30: qty 1

## 2016-10-30 MED ORDER — MORPHINE SULFATE (PF) 4 MG/ML IV SOLN
4.0000 mg | Freq: Once | INTRAVENOUS | Status: AC
  Administered 2016-10-30: 4 mg via INTRAVENOUS
  Filled 2016-10-30: qty 1

## 2016-10-30 MED ORDER — HYDROCODONE-ACETAMINOPHEN 5-325 MG PO TABS: 1.0000 | ORAL_TABLET | Freq: Once | ORAL | Status: DC

## 2016-10-30 MED ORDER — HYDROMORPHONE HCL 1 MG/ML IJ SOLN
1.0000 mg | Freq: Once | INTRAMUSCULAR | Status: AC
  Administered 2016-10-30: 1 mg via INTRAVENOUS
  Filled 2016-10-30: qty 1

## 2016-10-30 MED ORDER — IOPAMIDOL (ISOVUE-300) INJECTION 61%
INTRAVENOUS | Status: AC
  Administered 2016-10-30: 100 mL via INTRAVENOUS
  Filled 2016-10-30: qty 100

## 2016-10-30 MED ORDER — ONDANSETRON 4 MG PO TBDP
ORAL_TABLET | ORAL | Status: AC
  Filled 2016-10-30: qty 1

## 2016-10-30 MED ORDER — SODIUM CHLORIDE 0.9 % IV BOLUS (SEPSIS)
1000.0000 mL | Freq: Once | INTRAVENOUS | Status: AC
  Administered 2016-10-30: 1000 mL via INTRAVENOUS

## 2016-10-30 MED ORDER — METOCLOPRAMIDE HCL 5 MG/ML IJ SOLN
10.0000 mg | Freq: Once | INTRAMUSCULAR | Status: AC
  Administered 2016-10-30: 10 mg via INTRAVENOUS
  Filled 2016-10-30: qty 2

## 2016-10-30 MED ORDER — SCOPOLAMINE 1 MG/3DAYS TD PT72
1.0000 | MEDICATED_PATCH | TRANSDERMAL | Status: DC
  Administered 2016-10-30: 1.5 mg via TRANSDERMAL
  Filled 2016-10-30: qty 1

## 2016-10-30 MED ORDER — ONDANSETRON 4 MG PO TBDP
4.0000 mg | ORAL_TABLET | Freq: Once | ORAL | Status: AC | PRN
  Administered 2016-10-30: 4 mg via ORAL

## 2016-10-30 NOTE — ED Provider Notes (Signed)
[facility]-EMERGENCY DEPT Provider Note   CSN: [id number] Arrival date & time: 10/30/16  1024     History   Chief Complaint Chief Complaint  Patient presents with  . Abdominal Pain    HPI Mindy Love is a 61 y.o. female with history of chronic back pain, diabetes, MI, GERD, right sided AKA who presents with a one-week history of upper abdominal cramping and a one-day history of nausea and vomiting. Patient describes her pain as severe. She has taken oxycodone at home and could not keep it down. She took Phenergan without relief. Patient has had symptoms like this before and it was concluded to be gastritis. Patient is status post cholecystectomy 2-3 years ago. Patient denies any chest pain or shortness of breath. She denies any urinary symptoms. No watery bowel movement yesterday, but no bloody stools.  HPI  Past Medical History:  Diagnosis Date  . Anxiety   . Coronary artery disease   . Depression   . Diabetes mellitus   . Frequency   . GERD (gastroesophageal reflux disease)   . Herniated disc   . Hyperlipidemia   . Myocardial infarction [facility]) 10/12   OV Dr Tomie China  12/22/11 with clearance note on chart , EKG  11/12 on chart    Patient Active Problem List   Diagnosis Date Noted  . Stenosis of lumbosacral spine 06/26/2011    Past Surgical History:  Procedure Laterality Date  .  NASAL SURG    . APPENDECTOMY    . BACK SURGERY    . CARDIAC CATHETERIZATION  10/12  . CARDIAC STENTS    . CORONARY ARTERY BYPASS GRAFT  X 4 VESSELS 2012  . EYE SURGERY     bilateral lasik  . LUMBAR LAMINECTOMY/DECOMPRESSION MICRODISCECTOMY  06/25/2011   Procedure: LUMBAR LAMINECTOMY/DECOMPRESSION MICRODISCECTOMY;  Surgeon: Jacki Cones;  Location: [facility];  Service: Orthopedics;  Laterality: Right;  Lumbar Hemi Laminectomy L5 - S1 on the Right/Microdiscectomy on the Right  (X-Ray  . LUMBAR LAMINECTOMY/DECOMPRESSION MICRODISCECTOMY  12/23/2011   Procedure: LUMBAR LAMINECTOMY/DECOMPRESSION  MICRODISCECTOMY;  Surgeon: Jacki Cones, MD;  Location: [facility] ORS;  Service: Orthopedics;  Laterality: Right;  Hemi Laminectomy, Microdiscectomy L5-S1 on right  . OOPHORECTOMY    . right above knee amputation    . ROTATOR CUFF REPAIR  right    OB History    No data available       Home Medications    Prior to Admission medications   Medication Sig Start Date End Date Taking? Authorizing Provider  aspirin EC 81 MG tablet Take 81 mg by mouth daily after breakfast. States LD 12/16/11   Yes [provider]  atorvastatin (LIPITOR) 20 MG tablet Take 20 mg by mouth daily.   Yes [provider]  calcium-vitamin D (OSCAL WITH D) 500-200 MG-UNIT per tablet Take 1 tablet by mouth daily.   Yes [provider]  clonazePAM (KLONOPIN) 0.5 MG tablet Take 0.5 mg by mouth at bedtime.   Yes [provider]  dicyclomine (BENTYL) 20 MG tablet Take 20 mg by mouth every 6 (six) hours.   Yes [provider]  gabapentin (NEURONTIN) 800 MG tablet Take 800 mg by mouth 4 (four) times daily.   Yes [provider]  hydrOXYzine (ATARAX/VISTARIL) 25 MG tablet Take 25 mg by mouth 3 (three) times daily as needed for anxiety.   Yes [provider]  ALPRAZolam Prudy Feeler) 1 MG tablet Take 1 mg by mouth every 12 (twelve) hours as needed. Anxiety  [provider]  atorvastatin (LIPITOR) 80 MG tablet Take 80 mg by mouth at bedtime.     [provider]  buPROPion (WELLBUTRIN) 75 MG tablet Take 75 mg by mouth 2 (two) times daily.    [provider]  diazepam (VALIUM) 10 MG tablet Take 20 mg by mouth at bedtime. spasms    [provider]  enalapril (VASOTEC) 2.5 MG tablet Take 2.5 mg by mouth every evening.    [provider]  FLUoxetine (PROZAC) 40 MG capsule Take 40 mg by mouth 2 (two) times daily.     [provider]  gabapentin (NEURONTIN) 300 MG capsule Take 300 mg by mouth 3 (three) times daily.     [provider]  glipiZIDE-metformin (METAGLIP) 5-500 MG per tablet Take 1 tablet by mouth 2 (two) times daily before a meal.     [provider]  insulin glargine (LANTUS) 100 UNIT/ML injection Inject 70 Units into the skin at bedtime.     [provider]  insulin glulisine (APIDRA) 100 UNIT/ML injection Inject 3-10 Units into the skin 3 (three) times daily before meals. Takes 3 units if sugar is below 150-then will increase as needed    [provider]  nitroGLYCERIN (NITROSTAT) 0.4 MG SL tablet Place 0.4 mg under the tongue every 5 (five) minutes as needed. Chest pain    [provider]  oxyCODONE-acetaminophen (PERCOCET) 10-325 MG per tablet Take 1 tablet by mouth See admin instructions. 1 tablet 5 times per day 12/25/11   Dimitri Ped, PA-C  prasugrel (EFFIENT) 10 MG TABS Take 10 mg by mouth daily after breakfast. States LD 12/16/11    [provider]    Family History No family history on file.  Social History Social History  Substance Use Topics  . Smoking status: Current Every Day Smoker    Packs/day: 1.00    Types: Cigarettes    Last attempt to quit: 03/23/2009  . Smokeless tobacco: Never Used  . Alcohol use No     Allergies   Codeine; Sulfa antibiotics; and Tramadol   Review of Systems Review of Systems  Constitutional: Negative for chills and fever.  HENT: Negative for facial swelling and sore throat.   Respiratory: Negative for shortness of breath.   Cardiovascular: Negative for chest pain.  Gastrointestinal: Positive for abdominal pain, diarrhea, nausea and vomiting. Negative for blood in stool.  Genitourinary: Negative for dysuria.  Musculoskeletal: Negative for back pain.  Skin: Negative for rash and wound.  Neurological: Negative for headaches.  Psychiatric/Behavioral: The patient is not nervous/anxious.      Physical Exam Updated Vital Signs BP (!) 179/88   Pulse (!) 45   Temp 98.3 F (36.8 C) (Oral)   Resp  16   Ht 5\' 2"  (1.575 m)   Wt 215 lb (97.5 kg)   SpO2 96%   BMI 39.32 kg/m   Physical Exam  Constitutional: She appears well-developed and well-nourished. No distress.  HENT:  Head: Normocephalic and atraumatic.  Mouth/Throat: Oropharynx is clear and moist. No oropharyngeal exudate.  Eyes: Conjunctivae are normal. Pupils are equal, round, and reactive to light. Right eye exhibits no discharge. Left eye exhibits no discharge. No scleral icterus.  Neck: Normal range of motion. Neck supple. No thyromegaly present.  Cardiovascular: Normal rate, regular rhythm, normal heart sounds and intact distal pulses.  Exam reveals no gallop and no friction rub.   No murmur heard. Pulmonary/Chest: Effort normal and breath sounds normal. No stridor. No  respiratory distress. She has no wheezes. She has no rales.  Abdominal: Soft. Bowel sounds are normal. She exhibits no distension. There is generalized tenderness. There is guarding. There is no rebound.  Musculoskeletal: She exhibits no edema.  Lymphadenopathy:    She has no cervical adenopathy.  Neurological: She is alert. Coordination normal.  Skin: Skin is warm and dry. No rash noted. She is not diaphoretic. No pallor.  Psychiatric: She has a normal mood and affect.  Nursing note and vitals reviewed.    ED Treatments / Results  Labs (all labs ordered are listed, but only abnormal results are displayed) Labs Reviewed  COMPREHENSIVE METABOLIC PANEL - Abnormal; Notable for the following:       Result Value   Chloride 98 (*)    Glucose, Bld 393 (*)    Creatinine, Ser 1.24 (*)    Albumin 3.2 (*)    Total Bilirubin 1.3 (*)    GFR calc non Af Amer 47 (*)    GFR calc Af Amer 54 (*)    Anion gap 16 (*)    All other components within normal limits  CBC - Abnormal; Notable for the following:    WBC 16.7 (*)    RBC 5.77 (*)    Hemoglobin 17.1 (*)    HCT 49.8 (*)    RDW 15.6 (*)    All other components within normal limits  URINALYSIS, ROUTINE W  REFLEX MICROSCOPIC - Abnormal; Notable for the following:    APPearance CLOUDY (*)    Glucose, UA >=500 (*)    Ketones, ur 20 (*)    Protein, ur >=300 (*)    Bacteria, UA FEW (*)    Squamous Epithelial / LPF 6-30 (*)    All other components within normal limits  LIPASE, BLOOD    EKG  EKG Interpretation None       Radiology Ct Abdomen Pelvis W Contrast  Result Date: 10/30/2016 CLINICAL DATA:  Abdominal pain.  Nausea and vomiting. EXAM: CT ABDOMEN AND PELVIS WITH CONTRAST TECHNIQUE: Multidetector CT imaging of the abdomen and pelvis was performed using the standard protocol following bolus administration of intravenous contrast. CONTRAST:  ISOVUE-300 IOPAMIDOL (ISOVUE-300) INJECTION 61% COMPARISON:  10/13/2016 FINDINGS: Lower chest: No acute abnormality. Hepatobiliary: No focal liver abnormality is seen. Status post cholecystectomy. No biliary dilatation. Pancreas: Unremarkable. No pancreatic ductal dilatation or surrounding inflammatory changes. Spleen: Normal in size without focal abnormality. Adrenals/Urinary Tract: Normal appearance of the adrenal glands. Mild asymmetric atrophy of the right kidney. 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 No mass or hydronephrosis identified. The urinary bladder appears normal. Stomach/Bowel: The stomach and small bowel loops are unremarkable. The appendix is not visualize. Mild diffuse colonic wall thickening likely reflects incomplete distention. Vascular/Lymphatic: Aortic aortic atherosclerosis. No aneurysm. No abdominal or pelvic adenopathy. Reproductive: The uterus and the adnexal structures are unremarkable. Other: There is no ascites or focal fluid collections within the abdomen or pelvis. Musculoskeletal: Status post fusion of L5-S1. IMPRESSION: 1. No definite acute findings identified within the abdomen or pelvis. 2. Mild diffuse bladder wall thickening is favored to represent incomplete distention.  No surrounding inflammatory change noted. 3.  Aortic Atherosclerosis (ICD10-I70.0). Electronically Signed   By: Signa Kell M.D.   On: 10/30/2016 13:29    Procedures Procedures (including critical care time)  Medications Ordered in ED Medications  ondansetron (ZOFRAN-ODT) 4 MG disintegrating tablet (not administered)  scopolamine (TRANSDERM-SCOP) 1 MG/3DAYS 1.5 mg (1.5 mg  Transdermal Patch Applied 10/30/16 1450)  ondansetron (ZOFRAN-ODT) disintegrating tablet 4 mg (4 mg Oral Given 10/30/16 1036)  sodium chloride 0.9 % bolus 1,000 mL (0 mLs Intravenous Stopped 10/30/16 1244)  morphine 4 MG/ML injection 4 mg (4 mg Intravenous Given 10/30/16 1148)  metoCLOPramide (REGLAN) injection 10 mg (10 mg Intravenous Given 10/30/16 1146)  iopamidol (ISOVUE-300) 61 % injection (100 mLs Intravenous Contrast Given 10/30/16 1257)  haloperidol lactate (HALDOL) injection 2.5 mg (2.5 mg Intravenous Given 10/30/16 1201)  diphenhydrAMINE (BENADRYL) injection 25 mg (25 mg Intravenous Given 10/30/16 1200)  HYDROmorphone (DILAUDID) injection 1 mg (1 mg Intravenous Given 10/30/16 1245)  sodium chloride 0.9 % bolus 1,000 mL (0 mLs Intravenous Stopped 10/30/16 1424)  haloperidol lactate (HALDOL) injection 2.5 mg (2.5 mg Intravenous Given 10/30/16 1407)  HYDROmorphone (DILAUDID) injection 1 mg (1 mg Intravenous Given 10/30/16 1409)     Initial Impression / Assessment and Plan / ED Course  I have reviewed the triage vital signs and the nursing notes.  Pertinent labs & imaging results that were available during my care of the patient were reviewed by me and considered in my medical decision making (see chart for details).     Patient with abdominal pain and nausea and vomiting. No acute findings found on CT abdomen pelvis. CBC shows WBC 16.7, hemoglobin 17.1. CMP shows chloride 98, glucose 393, creatinine 1.4, total bilirubin 1.3, anion gap 16.Lipase 14. UA shows 20 ketones, >300 protein, 6-30 squamous cells. Patient given 2  L of fluid in the ED. Pain is controlled with Dilaudid and nausea and vomiting ultimately controlled with Haldol. Patient is feeling much better. We'll place a scopolomine patch to help with symptoms. Patient has history of similar symptoms in the past. Patient has Bentyl, Phenergan, oxycodone at home. Patient to follow up with PCP and gastroenterology for further evaluation and treatment. Return precautions discussed. Patient understands and agrees with plan. Patient vitals stable throughout ED course and discharged in satisfactory condition. I discussed patient case with Dr. Fayrene Fearing who guided the patient's management and agrees with plan.  Final Clinical Impressions(s) / ED Diagnoses   Final diagnoses:  Generalized abdominal pain  Non-intractable vomiting with nausea, unspecified vomiting type    New Prescriptions New Prescriptions   No medications on file     Verdis Prime 10/30/16 1530    Rolland Porter, MD 11/10/16 [phone]

## 2016-10-30 NOTE — ED Notes (Signed)
Pt given ice water for PO challenge.

## 2016-10-30 NOTE — Discharge Instructions (Signed)
Keep scopolamine patch behind your ear for 3 days to help with your symptoms. Continue taking your at home medications as prescribed. Please follow-up with the primary care provider as well as a gastroenterologist for further evaluation and treatment of your symptoms. Please return to emergency department or develop any new or worsening symptoms.

## 2016-10-30 NOTE — ED Notes (Signed)
Pt calling out requesting something else for pain. 8/10, nausea is coming back.

## 2016-10-30 NOTE — ED Notes (Signed)
Pt is projectile vomiting across room. Requesting dilaudid for pain. CT came to get pt, asked to give more time, as pt is actively vomiting.

## 2016-10-30 NOTE — ED Triage Notes (Signed)
Per Pt, Pt is coming from home with complaints of generalized abdominal pain, nausea, vomiting, and diarrhea x 3 days.

## 2016-10-31 DIAGNOSIS — Z951 Presence of aortocoronary bypass graft: Secondary | ICD-10-CM | POA: Diagnosis not present

## 2016-10-31 DIAGNOSIS — K209 Esophagitis, unspecified: Secondary | ICD-10-CM | POA: Diagnosis not present

## 2016-10-31 DIAGNOSIS — F17210 Nicotine dependence, cigarettes, uncomplicated: Secondary | ICD-10-CM | POA: Diagnosis not present

## 2016-10-31 DIAGNOSIS — Q630 Accessory kidney: Secondary | ICD-10-CM | POA: Diagnosis not present

## 2016-10-31 DIAGNOSIS — K3189 Other diseases of stomach and duodenum: Secondary | ICD-10-CM | POA: Diagnosis not present

## 2016-10-31 DIAGNOSIS — R1310 Dysphagia, unspecified: Secondary | ICD-10-CM | POA: Diagnosis not present

## 2016-10-31 DIAGNOSIS — R69 Illness, unspecified: Secondary | ICD-10-CM | POA: Diagnosis not present

## 2016-10-31 DIAGNOSIS — R109 Unspecified abdominal pain: Secondary | ICD-10-CM | POA: Diagnosis not present

## 2016-10-31 DIAGNOSIS — E1151 Type 2 diabetes mellitus with diabetic peripheral angiopathy without gangrene: Secondary | ICD-10-CM | POA: Diagnosis not present

## 2016-10-31 DIAGNOSIS — R1013 Epigastric pain: Secondary | ICD-10-CM | POA: Diagnosis not present

## 2016-10-31 DIAGNOSIS — K319 Disease of stomach and duodenum, unspecified: Secondary | ICD-10-CM | POA: Diagnosis not present

## 2016-10-31 DIAGNOSIS — K210 Gastro-esophageal reflux disease with esophagitis: Secondary | ICD-10-CM | POA: Diagnosis not present

## 2016-10-31 DIAGNOSIS — R12 Heartburn: Secondary | ICD-10-CM | POA: Diagnosis not present

## 2016-10-31 DIAGNOSIS — J189 Pneumonia, unspecified organism: Secondary | ICD-10-CM | POA: Diagnosis not present

## 2016-10-31 DIAGNOSIS — I10 Essential (primary) hypertension: Secondary | ICD-10-CM | POA: Diagnosis not present

## 2016-10-31 DIAGNOSIS — I517 Cardiomegaly: Secondary | ICD-10-CM | POA: Diagnosis not present

## 2016-10-31 DIAGNOSIS — E1142 Type 2 diabetes mellitus with diabetic polyneuropathy: Secondary | ICD-10-CM | POA: Diagnosis not present

## 2016-10-31 DIAGNOSIS — D72829 Elevated white blood cell count, unspecified: Secondary | ICD-10-CM | POA: Diagnosis not present

## 2016-10-31 DIAGNOSIS — K208 Other esophagitis: Secondary | ICD-10-CM | POA: Diagnosis not present

## 2016-10-31 DIAGNOSIS — A419 Sepsis, unspecified organism: Secondary | ICD-10-CM | POA: Diagnosis not present

## 2016-10-31 DIAGNOSIS — R0989 Other specified symptoms and signs involving the circulatory and respiratory systems: Secondary | ICD-10-CM | POA: Diagnosis not present

## 2016-10-31 DIAGNOSIS — E785 Hyperlipidemia, unspecified: Secondary | ICD-10-CM | POA: Diagnosis not present

## 2016-10-31 DIAGNOSIS — I2510 Atherosclerotic heart disease of native coronary artery without angina pectoris: Secondary | ICD-10-CM | POA: Diagnosis not present

## 2016-10-31 DIAGNOSIS — R6510 Systemic inflammatory response syndrome (SIRS) of non-infectious origin without acute organ dysfunction: Secondary | ICD-10-CM | POA: Diagnosis not present

## 2016-11-01 DIAGNOSIS — I10 Essential (primary) hypertension: Secondary | ICD-10-CM | POA: Insufficient documentation

## 2016-11-01 DIAGNOSIS — E119 Type 2 diabetes mellitus without complications: Secondary | ICD-10-CM | POA: Insufficient documentation

## 2016-11-01 DIAGNOSIS — A419 Sepsis, unspecified organism: Secondary | ICD-10-CM | POA: Insufficient documentation

## 2016-11-02 DIAGNOSIS — E1142 Type 2 diabetes mellitus with diabetic polyneuropathy: Secondary | ICD-10-CM | POA: Insufficient documentation

## 2016-11-02 DIAGNOSIS — J189 Pneumonia, unspecified organism: Secondary | ICD-10-CM | POA: Insufficient documentation

## 2016-11-02 DIAGNOSIS — Z89619 Acquired absence of unspecified leg above knee: Secondary | ICD-10-CM | POA: Insufficient documentation

## 2016-12-02 DIAGNOSIS — R69 Illness, unspecified: Secondary | ICD-10-CM | POA: Diagnosis not present

## 2016-12-13 HISTORY — PX: TRANSTHORACIC ECHOCARDIOGRAM: SHX275

## 2016-12-31 DIAGNOSIS — I493 Ventricular premature depolarization: Secondary | ICD-10-CM | POA: Diagnosis not present

## 2016-12-31 DIAGNOSIS — E1143 Type 2 diabetes mellitus with diabetic autonomic (poly)neuropathy: Secondary | ICD-10-CM | POA: Diagnosis not present

## 2016-12-31 DIAGNOSIS — I2510 Atherosclerotic heart disease of native coronary artery without angina pectoris: Secondary | ICD-10-CM | POA: Diagnosis not present

## 2016-12-31 DIAGNOSIS — I472 Ventricular tachycardia: Secondary | ICD-10-CM | POA: Diagnosis not present

## 2016-12-31 DIAGNOSIS — A419 Sepsis, unspecified organism: Secondary | ICD-10-CM | POA: Diagnosis not present

## 2016-12-31 DIAGNOSIS — R0989 Other specified symptoms and signs involving the circulatory and respiratory systems: Secondary | ICD-10-CM | POA: Diagnosis not present

## 2016-12-31 DIAGNOSIS — I517 Cardiomegaly: Secondary | ICD-10-CM | POA: Diagnosis not present

## 2016-12-31 DIAGNOSIS — J449 Chronic obstructive pulmonary disease, unspecified: Secondary | ICD-10-CM | POA: Diagnosis not present

## 2016-12-31 DIAGNOSIS — R6510 Systemic inflammatory response syndrome (SIRS) of non-infectious origin without acute organ dysfunction: Secondary | ICD-10-CM | POA: Diagnosis not present

## 2016-12-31 DIAGNOSIS — E1165 Type 2 diabetes mellitus with hyperglycemia: Secondary | ICD-10-CM | POA: Diagnosis not present

## 2016-12-31 DIAGNOSIS — R9431 Abnormal electrocardiogram [ECG] [EKG]: Secondary | ICD-10-CM | POA: Diagnosis not present

## 2016-12-31 DIAGNOSIS — I739 Peripheral vascular disease, unspecified: Secondary | ICD-10-CM | POA: Diagnosis not present

## 2016-12-31 DIAGNOSIS — E785 Hyperlipidemia, unspecified: Secondary | ICD-10-CM | POA: Diagnosis not present

## 2016-12-31 DIAGNOSIS — E1110 Type 2 diabetes mellitus with ketoacidosis without coma: Secondary | ICD-10-CM | POA: Diagnosis not present

## 2016-12-31 DIAGNOSIS — Z452 Encounter for adjustment and management of vascular access device: Secondary | ICD-10-CM | POA: Diagnosis not present

## 2016-12-31 DIAGNOSIS — E872 Acidosis: Secondary | ICD-10-CM | POA: Diagnosis not present

## 2016-12-31 DIAGNOSIS — K3184 Gastroparesis: Secondary | ICD-10-CM | POA: Diagnosis not present

## 2016-12-31 DIAGNOSIS — R1084 Generalized abdominal pain: Secondary | ICD-10-CM | POA: Diagnosis not present

## 2016-12-31 DIAGNOSIS — R69 Illness, unspecified: Secondary | ICD-10-CM | POA: Diagnosis not present

## 2017-01-01 DIAGNOSIS — K3184 Gastroparesis: Secondary | ICD-10-CM

## 2017-01-01 DIAGNOSIS — E1143 Type 2 diabetes mellitus with diabetic autonomic (poly)neuropathy: Secondary | ICD-10-CM | POA: Insufficient documentation

## 2017-01-05 DIAGNOSIS — I472 Ventricular tachycardia: Secondary | ICD-10-CM | POA: Diagnosis not present

## 2017-01-11 DIAGNOSIS — M5417 Radiculopathy, lumbosacral region: Secondary | ICD-10-CM | POA: Diagnosis not present

## 2017-01-11 DIAGNOSIS — G894 Chronic pain syndrome: Secondary | ICD-10-CM | POA: Diagnosis not present

## 2017-01-11 DIAGNOSIS — R51 Headache: Secondary | ICD-10-CM | POA: Diagnosis not present

## 2017-01-11 DIAGNOSIS — M5127 Other intervertebral disc displacement, lumbosacral region: Secondary | ICD-10-CM | POA: Diagnosis not present

## 2017-01-11 DIAGNOSIS — M5442 Lumbago with sciatica, left side: Secondary | ICD-10-CM | POA: Diagnosis not present

## 2017-01-11 DIAGNOSIS — M5441 Lumbago with sciatica, right side: Secondary | ICD-10-CM | POA: Diagnosis not present

## 2017-01-11 DIAGNOSIS — R200 Anesthesia of skin: Secondary | ICD-10-CM | POA: Diagnosis not present

## 2017-01-11 DIAGNOSIS — R202 Paresthesia of skin: Secondary | ICD-10-CM

## 2017-02-13 DIAGNOSIS — N179 Acute kidney failure, unspecified: Secondary | ICD-10-CM | POA: Diagnosis not present

## 2017-02-13 DIAGNOSIS — R0689 Other abnormalities of breathing: Secondary | ICD-10-CM | POA: Diagnosis not present

## 2017-02-13 DIAGNOSIS — R9431 Abnormal electrocardiogram [ECG] [EKG]: Secondary | ICD-10-CM | POA: Insufficient documentation

## 2017-02-13 DIAGNOSIS — R0602 Shortness of breath: Secondary | ICD-10-CM | POA: Diagnosis not present

## 2017-02-13 DIAGNOSIS — J811 Chronic pulmonary edema: Secondary | ICD-10-CM | POA: Diagnosis not present

## 2017-02-13 DIAGNOSIS — J441 Chronic obstructive pulmonary disease with (acute) exacerbation: Secondary | ICD-10-CM | POA: Diagnosis not present

## 2017-02-13 DIAGNOSIS — E871 Hypo-osmolality and hyponatremia: Secondary | ICD-10-CM | POA: Diagnosis not present

## 2017-02-13 DIAGNOSIS — J189 Pneumonia, unspecified organism: Secondary | ICD-10-CM | POA: Diagnosis not present

## 2017-02-13 DIAGNOSIS — J9601 Acute respiratory failure with hypoxia: Secondary | ICD-10-CM | POA: Diagnosis not present

## 2017-02-13 DIAGNOSIS — I509 Heart failure, unspecified: Secondary | ICD-10-CM | POA: Diagnosis not present

## 2017-02-13 DIAGNOSIS — I21A1 Myocardial infarction type 2: Secondary | ICD-10-CM | POA: Diagnosis not present

## 2017-02-13 DIAGNOSIS — A419 Sepsis, unspecified organism: Secondary | ICD-10-CM | POA: Diagnosis not present

## 2017-02-13 DIAGNOSIS — J168 Pneumonia due to other specified infectious organisms: Secondary | ICD-10-CM | POA: Diagnosis not present

## 2017-02-13 DIAGNOSIS — J440 Chronic obstructive pulmonary disease with acute lower respiratory infection: Secondary | ICD-10-CM | POA: Diagnosis not present

## 2017-02-13 DIAGNOSIS — I5043 Acute on chronic combined systolic (congestive) and diastolic (congestive) heart failure: Secondary | ICD-10-CM | POA: Diagnosis not present

## 2017-02-13 DIAGNOSIS — I214 Non-ST elevation (NSTEMI) myocardial infarction: Secondary | ICD-10-CM | POA: Diagnosis not present

## 2017-02-13 DIAGNOSIS — K219 Gastro-esophageal reflux disease without esophagitis: Secondary | ICD-10-CM | POA: Insufficient documentation

## 2017-02-13 DIAGNOSIS — I5021 Acute systolic (congestive) heart failure: Secondary | ICD-10-CM | POA: Diagnosis not present

## 2017-02-13 DIAGNOSIS — I130 Hypertensive heart and chronic kidney disease with heart failure and stage 1 through stage 4 chronic kidney disease, or unspecified chronic kidney disease: Secondary | ICD-10-CM | POA: Diagnosis not present

## 2017-02-13 DIAGNOSIS — I110 Hypertensive heart disease with heart failure: Secondary | ICD-10-CM | POA: Diagnosis not present

## 2017-02-13 DIAGNOSIS — I493 Ventricular premature depolarization: Secondary | ICD-10-CM | POA: Diagnosis not present

## 2017-02-13 DIAGNOSIS — J9690 Respiratory failure, unspecified, unspecified whether with hypoxia or hypercapnia: Secondary | ICD-10-CM | POA: Diagnosis not present

## 2017-02-13 DIAGNOSIS — R008 Other abnormalities of heart beat: Secondary | ICD-10-CM | POA: Diagnosis not present

## 2017-02-13 DIAGNOSIS — I517 Cardiomegaly: Secondary | ICD-10-CM | POA: Diagnosis not present

## 2017-02-14 DIAGNOSIS — J9601 Acute respiratory failure with hypoxia: Secondary | ICD-10-CM | POA: Diagnosis not present

## 2017-02-15 DIAGNOSIS — J9601 Acute respiratory failure with hypoxia: Secondary | ICD-10-CM | POA: Diagnosis not present

## 2017-02-16 DIAGNOSIS — I509 Heart failure, unspecified: Secondary | ICD-10-CM | POA: Diagnosis not present

## 2017-02-16 DIAGNOSIS — J9601 Acute respiratory failure with hypoxia: Secondary | ICD-10-CM | POA: Diagnosis not present

## 2017-02-17 DIAGNOSIS — N183 Chronic kidney disease, stage 3 unspecified: Secondary | ICD-10-CM | POA: Insufficient documentation

## 2017-02-17 DIAGNOSIS — J9601 Acute respiratory failure with hypoxia: Secondary | ICD-10-CM | POA: Diagnosis not present

## 2017-02-18 DIAGNOSIS — J9601 Acute respiratory failure with hypoxia: Secondary | ICD-10-CM | POA: Diagnosis not present

## 2017-02-22 DIAGNOSIS — Z79899 Other long term (current) drug therapy: Secondary | ICD-10-CM | POA: Diagnosis not present

## 2017-02-22 DIAGNOSIS — R69 Illness, unspecified: Secondary | ICD-10-CM | POA: Diagnosis not present

## 2017-02-22 DIAGNOSIS — M5136 Other intervertebral disc degeneration, lumbar region: Secondary | ICD-10-CM | POA: Diagnosis not present

## 2017-02-22 DIAGNOSIS — M961 Postlaminectomy syndrome, not elsewhere classified: Secondary | ICD-10-CM | POA: Diagnosis not present

## 2017-02-22 DIAGNOSIS — M25511 Pain in right shoulder: Secondary | ICD-10-CM | POA: Diagnosis not present

## 2017-02-22 DIAGNOSIS — M545 Low back pain: Secondary | ICD-10-CM | POA: Diagnosis not present

## 2017-02-22 DIAGNOSIS — Z780 Asymptomatic menopausal state: Secondary | ICD-10-CM | POA: Diagnosis not present

## 2017-02-23 DIAGNOSIS — E119 Type 2 diabetes mellitus without complications: Secondary | ICD-10-CM | POA: Diagnosis not present

## 2017-02-23 DIAGNOSIS — E1169 Type 2 diabetes mellitus with other specified complication: Secondary | ICD-10-CM | POA: Diagnosis not present

## 2017-02-23 DIAGNOSIS — E785 Hyperlipidemia, unspecified: Secondary | ICD-10-CM | POA: Diagnosis not present

## 2017-02-23 DIAGNOSIS — I214 Non-ST elevation (NSTEMI) myocardial infarction: Secondary | ICD-10-CM | POA: Diagnosis not present

## 2017-02-23 DIAGNOSIS — I10 Essential (primary) hypertension: Secondary | ICD-10-CM | POA: Diagnosis not present

## 2017-02-23 DIAGNOSIS — J441 Chronic obstructive pulmonary disease with (acute) exacerbation: Secondary | ICD-10-CM | POA: Diagnosis not present

## 2017-02-23 DIAGNOSIS — I739 Peripheral vascular disease, unspecified: Secondary | ICD-10-CM | POA: Diagnosis not present

## 2017-02-23 DIAGNOSIS — M545 Low back pain: Secondary | ICD-10-CM | POA: Diagnosis not present

## 2017-02-23 DIAGNOSIS — G8929 Other chronic pain: Secondary | ICD-10-CM | POA: Diagnosis not present

## 2017-03-03 DIAGNOSIS — M5136 Other intervertebral disc degeneration, lumbar region: Secondary | ICD-10-CM | POA: Diagnosis not present

## 2017-03-05 DIAGNOSIS — Z79899 Other long term (current) drug therapy: Secondary | ICD-10-CM | POA: Diagnosis not present

## 2017-03-05 DIAGNOSIS — Z794 Long term (current) use of insulin: Secondary | ICD-10-CM | POA: Diagnosis not present

## 2017-03-05 DIAGNOSIS — Z23 Encounter for immunization: Secondary | ICD-10-CM | POA: Diagnosis not present

## 2017-03-05 DIAGNOSIS — R0902 Hypoxemia: Secondary | ICD-10-CM | POA: Diagnosis not present

## 2017-03-05 DIAGNOSIS — I700 Atherosclerosis of aorta: Secondary | ICD-10-CM | POA: Diagnosis not present

## 2017-03-05 DIAGNOSIS — E1142 Type 2 diabetes mellitus with diabetic polyneuropathy: Secondary | ICD-10-CM | POA: Diagnosis not present

## 2017-03-05 DIAGNOSIS — R079 Chest pain, unspecified: Secondary | ICD-10-CM | POA: Diagnosis not present

## 2017-03-05 DIAGNOSIS — I130 Hypertensive heart and chronic kidney disease with heart failure and stage 1 through stage 4 chronic kidney disease, or unspecified chronic kidney disease: Secondary | ICD-10-CM | POA: Diagnosis not present

## 2017-03-05 DIAGNOSIS — J449 Chronic obstructive pulmonary disease, unspecified: Secondary | ICD-10-CM | POA: Diagnosis not present

## 2017-03-05 DIAGNOSIS — M961 Postlaminectomy syndrome, not elsewhere classified: Secondary | ICD-10-CM | POA: Diagnosis not present

## 2017-03-05 DIAGNOSIS — E119 Type 2 diabetes mellitus without complications: Secondary | ICD-10-CM | POA: Diagnosis not present

## 2017-03-05 DIAGNOSIS — I5021 Acute systolic (congestive) heart failure: Secondary | ICD-10-CM | POA: Diagnosis not present

## 2017-03-05 DIAGNOSIS — R0602 Shortness of breath: Secondary | ICD-10-CM | POA: Diagnosis not present

## 2017-03-05 DIAGNOSIS — N183 Chronic kidney disease, stage 3 (moderate): Secondary | ICD-10-CM | POA: Diagnosis not present

## 2017-03-05 DIAGNOSIS — M545 Low back pain: Secondary | ICD-10-CM | POA: Diagnosis not present

## 2017-03-05 DIAGNOSIS — J441 Chronic obstructive pulmonary disease with (acute) exacerbation: Secondary | ICD-10-CM | POA: Diagnosis not present

## 2017-03-05 DIAGNOSIS — E876 Hypokalemia: Secondary | ICD-10-CM | POA: Diagnosis not present

## 2017-03-05 DIAGNOSIS — R509 Fever, unspecified: Secondary | ICD-10-CM | POA: Diagnosis not present

## 2017-03-05 DIAGNOSIS — J811 Chronic pulmonary edema: Secondary | ICD-10-CM | POA: Diagnosis not present

## 2017-03-05 DIAGNOSIS — I5023 Acute on chronic systolic (congestive) heart failure: Secondary | ICD-10-CM | POA: Diagnosis not present

## 2017-03-05 DIAGNOSIS — N390 Urinary tract infection, site not specified: Secondary | ICD-10-CM | POA: Diagnosis not present

## 2017-03-05 DIAGNOSIS — I209 Angina pectoris, unspecified: Secondary | ICD-10-CM | POA: Diagnosis not present

## 2017-03-05 DIAGNOSIS — N12 Tubulo-interstitial nephritis, not specified as acute or chronic: Secondary | ICD-10-CM | POA: Diagnosis not present

## 2017-03-05 DIAGNOSIS — I509 Heart failure, unspecified: Secondary | ICD-10-CM | POA: Diagnosis not present

## 2017-03-05 DIAGNOSIS — E118 Type 2 diabetes mellitus with unspecified complications: Secondary | ICD-10-CM | POA: Diagnosis not present

## 2017-03-05 DIAGNOSIS — M5136 Other intervertebral disc degeneration, lumbar region: Secondary | ICD-10-CM | POA: Diagnosis not present

## 2017-03-05 DIAGNOSIS — E1122 Type 2 diabetes mellitus with diabetic chronic kidney disease: Secondary | ICD-10-CM | POA: Diagnosis not present

## 2017-03-05 DIAGNOSIS — E785 Hyperlipidemia, unspecified: Secondary | ICD-10-CM | POA: Diagnosis not present

## 2017-03-05 DIAGNOSIS — I110 Hypertensive heart disease with heart failure: Secondary | ICD-10-CM | POA: Diagnosis not present

## 2017-03-05 DIAGNOSIS — R071 Chest pain on breathing: Secondary | ICD-10-CM | POA: Diagnosis not present

## 2017-03-08 DIAGNOSIS — Z23 Encounter for immunization: Secondary | ICD-10-CM | POA: Diagnosis not present

## 2017-03-08 DIAGNOSIS — M5136 Other intervertebral disc degeneration, lumbar region: Secondary | ICD-10-CM | POA: Diagnosis not present

## 2017-03-08 DIAGNOSIS — E876 Hypokalemia: Secondary | ICD-10-CM | POA: Insufficient documentation

## 2017-03-08 DIAGNOSIS — M961 Postlaminectomy syndrome, not elsewhere classified: Secondary | ICD-10-CM | POA: Diagnosis not present

## 2017-03-08 DIAGNOSIS — M545 Low back pain: Secondary | ICD-10-CM | POA: Diagnosis not present

## 2017-03-23 DIAGNOSIS — I889 Nonspecific lymphadenitis, unspecified: Secondary | ICD-10-CM | POA: Diagnosis not present

## 2017-04-05 DIAGNOSIS — Z79899 Other long term (current) drug therapy: Secondary | ICD-10-CM | POA: Diagnosis not present

## 2017-04-05 DIAGNOSIS — M5136 Other intervertebral disc degeneration, lumbar region: Secondary | ICD-10-CM | POA: Diagnosis not present

## 2017-04-05 DIAGNOSIS — M545 Low back pain: Secondary | ICD-10-CM | POA: Diagnosis not present

## 2017-04-05 DIAGNOSIS — G546 Phantom limb syndrome with pain: Secondary | ICD-10-CM | POA: Diagnosis not present

## 2017-04-05 DIAGNOSIS — M25511 Pain in right shoulder: Secondary | ICD-10-CM | POA: Diagnosis not present

## 2017-04-06 DIAGNOSIS — E119 Type 2 diabetes mellitus without complications: Secondary | ICD-10-CM | POA: Diagnosis not present

## 2017-04-06 DIAGNOSIS — R69 Illness, unspecified: Secondary | ICD-10-CM | POA: Diagnosis not present

## 2017-04-06 DIAGNOSIS — I10 Essential (primary) hypertension: Secondary | ICD-10-CM | POA: Diagnosis not present

## 2017-04-06 DIAGNOSIS — J432 Centrilobular emphysema: Secondary | ICD-10-CM | POA: Diagnosis not present

## 2017-04-06 DIAGNOSIS — R0989 Other specified symptoms and signs involving the circulatory and respiratory systems: Secondary | ICD-10-CM | POA: Diagnosis not present

## 2017-04-06 DIAGNOSIS — M542 Cervicalgia: Secondary | ICD-10-CM | POA: Diagnosis not present

## 2017-04-06 DIAGNOSIS — I2510 Atherosclerotic heart disease of native coronary artery without angina pectoris: Secondary | ICD-10-CM | POA: Diagnosis not present

## 2017-04-06 DIAGNOSIS — F17200 Nicotine dependence, unspecified, uncomplicated: Secondary | ICD-10-CM | POA: Diagnosis not present

## 2017-04-06 DIAGNOSIS — E1142 Type 2 diabetes mellitus with diabetic polyneuropathy: Secondary | ICD-10-CM | POA: Diagnosis not present

## 2017-04-06 DIAGNOSIS — I5021 Acute systolic (congestive) heart failure: Secondary | ICD-10-CM | POA: Diagnosis not present

## 2017-04-20 DIAGNOSIS — Z951 Presence of aortocoronary bypass graft: Secondary | ICD-10-CM | POA: Diagnosis not present

## 2017-04-20 DIAGNOSIS — I110 Hypertensive heart disease with heart failure: Secondary | ICD-10-CM | POA: Diagnosis not present

## 2017-04-20 DIAGNOSIS — K219 Gastro-esophageal reflux disease without esophagitis: Secondary | ICD-10-CM | POA: Diagnosis not present

## 2017-04-20 DIAGNOSIS — K3184 Gastroparesis: Secondary | ICD-10-CM | POA: Diagnosis not present

## 2017-04-20 DIAGNOSIS — Z9049 Acquired absence of other specified parts of digestive tract: Secondary | ICD-10-CM | POA: Diagnosis not present

## 2017-04-20 DIAGNOSIS — E785 Hyperlipidemia, unspecified: Secondary | ICD-10-CM | POA: Diagnosis not present

## 2017-04-20 DIAGNOSIS — J441 Chronic obstructive pulmonary disease with (acute) exacerbation: Secondary | ICD-10-CM | POA: Diagnosis not present

## 2017-04-20 DIAGNOSIS — E1143 Type 2 diabetes mellitus with diabetic autonomic (poly)neuropathy: Secondary | ICD-10-CM | POA: Diagnosis not present

## 2017-04-20 DIAGNOSIS — G8929 Other chronic pain: Secondary | ICD-10-CM | POA: Diagnosis not present

## 2017-04-20 DIAGNOSIS — R079 Chest pain, unspecified: Secondary | ICD-10-CM | POA: Diagnosis not present

## 2017-04-20 DIAGNOSIS — I2510 Atherosclerotic heart disease of native coronary artery without angina pectoris: Secondary | ICD-10-CM | POA: Diagnosis not present

## 2017-04-20 DIAGNOSIS — I509 Heart failure, unspecified: Secondary | ICD-10-CM | POA: Diagnosis not present

## 2017-04-20 DIAGNOSIS — M545 Low back pain: Secondary | ICD-10-CM | POA: Diagnosis not present

## 2017-04-21 DIAGNOSIS — R69 Illness, unspecified: Secondary | ICD-10-CM | POA: Diagnosis not present

## 2017-05-04 DIAGNOSIS — G546 Phantom limb syndrome with pain: Secondary | ICD-10-CM | POA: Diagnosis not present

## 2017-05-04 DIAGNOSIS — Z89611 Acquired absence of right leg above knee: Secondary | ICD-10-CM | POA: Diagnosis not present

## 2017-05-04 DIAGNOSIS — M5136 Other intervertebral disc degeneration, lumbar region: Secondary | ICD-10-CM | POA: Diagnosis not present

## 2017-05-04 DIAGNOSIS — M545 Low back pain: Secondary | ICD-10-CM | POA: Diagnosis not present

## 2017-05-04 DIAGNOSIS — Z79899 Other long term (current) drug therapy: Secondary | ICD-10-CM | POA: Diagnosis not present

## 2017-06-03 DIAGNOSIS — M5136 Other intervertebral disc degeneration, lumbar region: Secondary | ICD-10-CM | POA: Diagnosis not present

## 2017-06-03 DIAGNOSIS — G546 Phantom limb syndrome with pain: Secondary | ICD-10-CM | POA: Diagnosis not present

## 2017-06-03 DIAGNOSIS — E119 Type 2 diabetes mellitus without complications: Secondary | ICD-10-CM | POA: Diagnosis not present

## 2017-06-03 DIAGNOSIS — Z79899 Other long term (current) drug therapy: Secondary | ICD-10-CM | POA: Diagnosis not present

## 2017-06-03 DIAGNOSIS — M545 Low back pain: Secondary | ICD-10-CM | POA: Diagnosis not present

## 2017-06-24 DIAGNOSIS — R509 Fever, unspecified: Secondary | ICD-10-CM | POA: Diagnosis not present

## 2017-06-24 DIAGNOSIS — K219 Gastro-esophageal reflux disease without esophagitis: Secondary | ICD-10-CM | POA: Diagnosis not present

## 2017-06-24 DIAGNOSIS — I5021 Acute systolic (congestive) heart failure: Secondary | ICD-10-CM | POA: Diagnosis not present

## 2017-06-24 DIAGNOSIS — J432 Centrilobular emphysema: Secondary | ICD-10-CM | POA: Diagnosis not present

## 2017-06-24 DIAGNOSIS — E785 Hyperlipidemia, unspecified: Secondary | ICD-10-CM | POA: Diagnosis not present

## 2017-06-24 DIAGNOSIS — I10 Essential (primary) hypertension: Secondary | ICD-10-CM | POA: Diagnosis not present

## 2017-06-24 DIAGNOSIS — I739 Peripheral vascular disease, unspecified: Secondary | ICD-10-CM | POA: Diagnosis not present

## 2017-06-24 DIAGNOSIS — R69 Illness, unspecified: Secondary | ICD-10-CM | POA: Diagnosis not present

## 2017-06-24 DIAGNOSIS — E1169 Type 2 diabetes mellitus with other specified complication: Secondary | ICD-10-CM | POA: Diagnosis not present

## 2017-06-24 DIAGNOSIS — Z1231 Encounter for screening mammogram for malignant neoplasm of breast: Secondary | ICD-10-CM | POA: Diagnosis not present

## 2017-06-24 DIAGNOSIS — M5442 Lumbago with sciatica, left side: Secondary | ICD-10-CM | POA: Diagnosis not present

## 2017-06-30 DIAGNOSIS — H25043 Posterior subcapsular polar age-related cataract, bilateral: Secondary | ICD-10-CM | POA: Diagnosis not present

## 2017-06-30 DIAGNOSIS — H211X3 Other vascular disorders of iris and ciliary body, bilateral: Secondary | ICD-10-CM | POA: Diagnosis not present

## 2017-06-30 DIAGNOSIS — Z794 Long term (current) use of insulin: Secondary | ICD-10-CM | POA: Diagnosis not present

## 2017-06-30 DIAGNOSIS — E113593 Type 2 diabetes mellitus with proliferative diabetic retinopathy without macular edema, bilateral: Secondary | ICD-10-CM | POA: Diagnosis not present

## 2017-06-30 DIAGNOSIS — H2513 Age-related nuclear cataract, bilateral: Secondary | ICD-10-CM | POA: Diagnosis not present

## 2017-06-30 DIAGNOSIS — H4313 Vitreous hemorrhage, bilateral: Secondary | ICD-10-CM | POA: Diagnosis not present

## 2017-07-08 DIAGNOSIS — M545 Low back pain: Secondary | ICD-10-CM | POA: Diagnosis not present

## 2017-07-08 DIAGNOSIS — G546 Phantom limb syndrome with pain: Secondary | ICD-10-CM | POA: Diagnosis not present

## 2017-07-08 DIAGNOSIS — M5136 Other intervertebral disc degeneration, lumbar region: Secondary | ICD-10-CM | POA: Diagnosis not present

## 2017-07-08 DIAGNOSIS — Z79899 Other long term (current) drug therapy: Secondary | ICD-10-CM | POA: Diagnosis not present

## 2017-07-08 DIAGNOSIS — M961 Postlaminectomy syndrome, not elsewhere classified: Secondary | ICD-10-CM | POA: Diagnosis not present

## 2017-07-17 DIAGNOSIS — I700 Atherosclerosis of aorta: Secondary | ICD-10-CM | POA: Diagnosis not present

## 2017-07-17 DIAGNOSIS — R197 Diarrhea, unspecified: Secondary | ICD-10-CM | POA: Diagnosis not present

## 2017-07-17 DIAGNOSIS — R112 Nausea with vomiting, unspecified: Secondary | ICD-10-CM | POA: Diagnosis not present

## 2017-07-17 DIAGNOSIS — Z794 Long term (current) use of insulin: Secondary | ICD-10-CM | POA: Diagnosis not present

## 2017-07-17 DIAGNOSIS — E785 Hyperlipidemia, unspecified: Secondary | ICD-10-CM | POA: Diagnosis not present

## 2017-07-17 DIAGNOSIS — Z79899 Other long term (current) drug therapy: Secondary | ICD-10-CM | POA: Diagnosis not present

## 2017-07-17 DIAGNOSIS — Z951 Presence of aortocoronary bypass graft: Secondary | ICD-10-CM

## 2017-07-17 DIAGNOSIS — E1169 Type 2 diabetes mellitus with other specified complication: Secondary | ICD-10-CM | POA: Diagnosis not present

## 2017-07-17 DIAGNOSIS — E1165 Type 2 diabetes mellitus with hyperglycemia: Secondary | ICD-10-CM | POA: Diagnosis not present

## 2017-07-17 DIAGNOSIS — I739 Peripheral vascular disease, unspecified: Secondary | ICD-10-CM

## 2017-07-17 DIAGNOSIS — R319 Hematuria, unspecified: Secondary | ICD-10-CM | POA: Diagnosis not present

## 2017-07-17 DIAGNOSIS — D72829 Elevated white blood cell count, unspecified: Secondary | ICD-10-CM | POA: Diagnosis not present

## 2017-07-17 DIAGNOSIS — M549 Dorsalgia, unspecified: Secondary | ICD-10-CM | POA: Diagnosis not present

## 2017-07-17 DIAGNOSIS — A419 Sepsis, unspecified organism: Secondary | ICD-10-CM | POA: Diagnosis not present

## 2017-07-17 DIAGNOSIS — Z720 Tobacco use: Secondary | ICD-10-CM | POA: Diagnosis not present

## 2017-07-17 DIAGNOSIS — J189 Pneumonia, unspecified organism: Secondary | ICD-10-CM | POA: Diagnosis not present

## 2017-07-17 DIAGNOSIS — Z89619 Acquired absence of unspecified leg above knee: Secondary | ICD-10-CM

## 2017-07-17 DIAGNOSIS — J181 Lobar pneumonia, unspecified organism: Secondary | ICD-10-CM | POA: Diagnosis not present

## 2017-07-17 DIAGNOSIS — E669 Obesity, unspecified: Secondary | ICD-10-CM | POA: Diagnosis not present

## 2017-07-17 DIAGNOSIS — E860 Dehydration: Secondary | ICD-10-CM | POA: Diagnosis not present

## 2017-07-17 DIAGNOSIS — K219 Gastro-esophageal reflux disease without esophagitis: Secondary | ICD-10-CM | POA: Diagnosis not present

## 2017-07-17 DIAGNOSIS — N390 Urinary tract infection, site not specified: Secondary | ICD-10-CM | POA: Diagnosis not present

## 2017-07-17 DIAGNOSIS — R69 Illness, unspecified: Secondary | ICD-10-CM | POA: Diagnosis not present

## 2017-07-18 DIAGNOSIS — R319 Hematuria, unspecified: Secondary | ICD-10-CM | POA: Diagnosis not present

## 2017-07-18 DIAGNOSIS — E860 Dehydration: Secondary | ICD-10-CM | POA: Diagnosis not present

## 2017-07-18 DIAGNOSIS — J181 Lobar pneumonia, unspecified organism: Secondary | ICD-10-CM | POA: Diagnosis not present

## 2017-07-18 DIAGNOSIS — M549 Dorsalgia, unspecified: Secondary | ICD-10-CM | POA: Diagnosis not present

## 2017-07-18 DIAGNOSIS — K219 Gastro-esophageal reflux disease without esophagitis: Secondary | ICD-10-CM | POA: Diagnosis not present

## 2017-07-18 DIAGNOSIS — A419 Sepsis, unspecified organism: Secondary | ICD-10-CM | POA: Diagnosis not present

## 2017-07-18 DIAGNOSIS — J189 Pneumonia, unspecified organism: Secondary | ICD-10-CM | POA: Diagnosis not present

## 2017-07-18 DIAGNOSIS — R197 Diarrhea, unspecified: Secondary | ICD-10-CM | POA: Diagnosis not present

## 2017-07-18 DIAGNOSIS — R112 Nausea with vomiting, unspecified: Secondary | ICD-10-CM | POA: Diagnosis not present

## 2017-07-18 DIAGNOSIS — E785 Hyperlipidemia, unspecified: Secondary | ICD-10-CM | POA: Diagnosis not present

## 2017-07-18 DIAGNOSIS — Z720 Tobacco use: Secondary | ICD-10-CM | POA: Diagnosis not present

## 2017-07-19 DIAGNOSIS — J189 Pneumonia, unspecified organism: Secondary | ICD-10-CM | POA: Diagnosis not present

## 2017-07-19 DIAGNOSIS — M549 Dorsalgia, unspecified: Secondary | ICD-10-CM | POA: Diagnosis not present

## 2017-07-19 DIAGNOSIS — R112 Nausea with vomiting, unspecified: Secondary | ICD-10-CM | POA: Diagnosis not present

## 2017-07-19 DIAGNOSIS — E860 Dehydration: Secondary | ICD-10-CM | POA: Diagnosis not present

## 2017-07-19 DIAGNOSIS — R319 Hematuria, unspecified: Secondary | ICD-10-CM | POA: Diagnosis not present

## 2017-07-19 DIAGNOSIS — R197 Diarrhea, unspecified: Secondary | ICD-10-CM | POA: Diagnosis not present

## 2017-07-19 DIAGNOSIS — A419 Sepsis, unspecified organism: Secondary | ICD-10-CM | POA: Diagnosis not present

## 2017-07-19 DIAGNOSIS — E785 Hyperlipidemia, unspecified: Secondary | ICD-10-CM | POA: Diagnosis not present

## 2017-07-19 DIAGNOSIS — J181 Lobar pneumonia, unspecified organism: Secondary | ICD-10-CM | POA: Diagnosis not present

## 2017-07-19 DIAGNOSIS — K219 Gastro-esophageal reflux disease without esophagitis: Secondary | ICD-10-CM | POA: Diagnosis not present

## 2017-07-20 DIAGNOSIS — R319 Hematuria, unspecified: Secondary | ICD-10-CM | POA: Diagnosis not present

## 2017-07-20 DIAGNOSIS — R112 Nausea with vomiting, unspecified: Secondary | ICD-10-CM | POA: Diagnosis not present

## 2017-07-20 DIAGNOSIS — M549 Dorsalgia, unspecified: Secondary | ICD-10-CM | POA: Diagnosis not present

## 2017-07-20 DIAGNOSIS — J189 Pneumonia, unspecified organism: Secondary | ICD-10-CM | POA: Diagnosis not present

## 2017-07-20 DIAGNOSIS — E860 Dehydration: Secondary | ICD-10-CM | POA: Diagnosis not present

## 2017-07-20 DIAGNOSIS — J181 Lobar pneumonia, unspecified organism: Secondary | ICD-10-CM | POA: Diagnosis not present

## 2017-07-20 DIAGNOSIS — A419 Sepsis, unspecified organism: Secondary | ICD-10-CM | POA: Diagnosis not present

## 2017-07-20 DIAGNOSIS — E785 Hyperlipidemia, unspecified: Secondary | ICD-10-CM | POA: Diagnosis not present

## 2017-07-20 DIAGNOSIS — K219 Gastro-esophageal reflux disease without esophagitis: Secondary | ICD-10-CM | POA: Diagnosis not present

## 2017-07-20 DIAGNOSIS — R197 Diarrhea, unspecified: Secondary | ICD-10-CM | POA: Diagnosis not present

## 2017-07-23 DIAGNOSIS — Z89611 Acquired absence of right leg above knee: Secondary | ICD-10-CM | POA: Diagnosis not present

## 2017-07-23 DIAGNOSIS — G894 Chronic pain syndrome: Secondary | ICD-10-CM | POA: Diagnosis not present

## 2017-07-23 DIAGNOSIS — E785 Hyperlipidemia, unspecified: Secondary | ICD-10-CM | POA: Diagnosis not present

## 2017-07-23 DIAGNOSIS — I119 Hypertensive heart disease without heart failure: Secondary | ICD-10-CM | POA: Diagnosis not present

## 2017-07-23 DIAGNOSIS — I96 Gangrene, not elsewhere classified: Secondary | ICD-10-CM | POA: Diagnosis not present

## 2017-07-23 DIAGNOSIS — D72825 Bandemia: Secondary | ICD-10-CM | POA: Diagnosis not present

## 2017-07-23 DIAGNOSIS — M5417 Radiculopathy, lumbosacral region: Secondary | ICD-10-CM | POA: Diagnosis not present

## 2017-07-23 DIAGNOSIS — I70245 Atherosclerosis of native arteries of left leg with ulceration of other part of foot: Secondary | ICD-10-CM | POA: Diagnosis not present

## 2017-07-23 DIAGNOSIS — Z79891 Long term (current) use of opiate analgesic: Secondary | ICD-10-CM | POA: Diagnosis not present

## 2017-07-23 DIAGNOSIS — R000 Tachycardia, unspecified: Secondary | ICD-10-CM | POA: Diagnosis not present

## 2017-07-23 DIAGNOSIS — R197 Diarrhea, unspecified: Secondary | ICD-10-CM | POA: Diagnosis not present

## 2017-07-23 DIAGNOSIS — R05 Cough: Secondary | ICD-10-CM | POA: Diagnosis not present

## 2017-07-23 DIAGNOSIS — Z7951 Long term (current) use of inhaled steroids: Secondary | ICD-10-CM | POA: Diagnosis not present

## 2017-07-23 DIAGNOSIS — Z7901 Long term (current) use of anticoagulants: Secondary | ICD-10-CM | POA: Diagnosis not present

## 2017-07-23 DIAGNOSIS — I10 Essential (primary) hypertension: Secondary | ICD-10-CM | POA: Diagnosis not present

## 2017-07-23 DIAGNOSIS — Z794 Long term (current) use of insulin: Secondary | ICD-10-CM | POA: Diagnosis not present

## 2017-07-23 DIAGNOSIS — M79605 Pain in left leg: Secondary | ICD-10-CM | POA: Diagnosis not present

## 2017-07-23 DIAGNOSIS — E1151 Type 2 diabetes mellitus with diabetic peripheral angiopathy without gangrene: Secondary | ICD-10-CM | POA: Diagnosis not present

## 2017-07-23 DIAGNOSIS — B9689 Other specified bacterial agents as the cause of diseases classified elsewhere: Secondary | ICD-10-CM | POA: Diagnosis not present

## 2017-07-23 DIAGNOSIS — J440 Chronic obstructive pulmonary disease with acute lower respiratory infection: Secondary | ICD-10-CM | POA: Diagnosis not present

## 2017-07-23 DIAGNOSIS — J449 Chronic obstructive pulmonary disease, unspecified: Secondary | ICD-10-CM | POA: Diagnosis not present

## 2017-07-23 DIAGNOSIS — E11628 Type 2 diabetes mellitus with other skin complications: Secondary | ICD-10-CM | POA: Diagnosis not present

## 2017-07-23 DIAGNOSIS — J189 Pneumonia, unspecified organism: Secondary | ICD-10-CM | POA: Diagnosis not present

## 2017-07-23 DIAGNOSIS — I6523 Occlusion and stenosis of bilateral carotid arteries: Secondary | ICD-10-CM | POA: Diagnosis not present

## 2017-07-23 DIAGNOSIS — E1142 Type 2 diabetes mellitus with diabetic polyneuropathy: Secondary | ICD-10-CM | POA: Diagnosis not present

## 2017-07-23 DIAGNOSIS — I998 Other disorder of circulatory system: Secondary | ICD-10-CM | POA: Diagnosis not present

## 2017-07-23 DIAGNOSIS — L03116 Cellulitis of left lower limb: Secondary | ICD-10-CM | POA: Diagnosis not present

## 2017-07-23 DIAGNOSIS — Z951 Presence of aortocoronary bypass graft: Secondary | ICD-10-CM | POA: Diagnosis not present

## 2017-07-23 DIAGNOSIS — R69 Illness, unspecified: Secondary | ICD-10-CM | POA: Diagnosis not present

## 2017-07-23 DIAGNOSIS — L97529 Non-pressure chronic ulcer of other part of left foot with unspecified severity: Secondary | ICD-10-CM | POA: Diagnosis not present

## 2017-07-23 DIAGNOSIS — M79662 Pain in left lower leg: Secondary | ICD-10-CM | POA: Diagnosis not present

## 2017-07-23 DIAGNOSIS — N179 Acute kidney failure, unspecified: Secondary | ICD-10-CM | POA: Diagnosis not present

## 2017-07-23 DIAGNOSIS — Z9861 Coronary angioplasty status: Secondary | ICD-10-CM | POA: Diagnosis not present

## 2017-07-23 DIAGNOSIS — I739 Peripheral vascular disease, unspecified: Secondary | ICD-10-CM | POA: Diagnosis not present

## 2017-07-23 DIAGNOSIS — M7989 Other specified soft tissue disorders: Secondary | ICD-10-CM | POA: Diagnosis not present

## 2017-07-23 DIAGNOSIS — Z9981 Dependence on supplemental oxygen: Secondary | ICD-10-CM | POA: Diagnosis not present

## 2017-07-23 DIAGNOSIS — I2510 Atherosclerotic heart disease of native coronary artery without angina pectoris: Secondary | ICD-10-CM | POA: Diagnosis not present

## 2017-07-23 DIAGNOSIS — E1165 Type 2 diabetes mellitus with hyperglycemia: Secondary | ICD-10-CM | POA: Diagnosis not present

## 2017-07-23 DIAGNOSIS — R52 Pain, unspecified: Secondary | ICD-10-CM | POA: Diagnosis not present

## 2017-07-23 DIAGNOSIS — E1140 Type 2 diabetes mellitus with diabetic neuropathy, unspecified: Secondary | ICD-10-CM | POA: Diagnosis not present

## 2017-07-23 DIAGNOSIS — I70222 Atherosclerosis of native arteries of extremities with rest pain, left leg: Secondary | ICD-10-CM | POA: Diagnosis not present

## 2017-07-23 DIAGNOSIS — I493 Ventricular premature depolarization: Secondary | ICD-10-CM | POA: Diagnosis not present

## 2017-07-23 DIAGNOSIS — M79672 Pain in left foot: Secondary | ICD-10-CM | POA: Diagnosis not present

## 2017-07-24 DIAGNOSIS — I739 Peripheral vascular disease, unspecified: Secondary | ICD-10-CM | POA: Insufficient documentation

## 2017-07-24 DIAGNOSIS — Z8701 Personal history of pneumonia (recurrent): Secondary | ICD-10-CM | POA: Insufficient documentation

## 2017-07-31 DIAGNOSIS — L97528 Non-pressure chronic ulcer of other part of left foot with other specified severity: Secondary | ICD-10-CM | POA: Diagnosis not present

## 2017-07-31 DIAGNOSIS — Z951 Presence of aortocoronary bypass graft: Secondary | ICD-10-CM | POA: Diagnosis not present

## 2017-07-31 DIAGNOSIS — Z794 Long term (current) use of insulin: Secondary | ICD-10-CM | POA: Diagnosis not present

## 2017-07-31 DIAGNOSIS — I96 Gangrene, not elsewhere classified: Secondary | ICD-10-CM | POA: Diagnosis not present

## 2017-07-31 DIAGNOSIS — I998 Other disorder of circulatory system: Secondary | ICD-10-CM | POA: Diagnosis not present

## 2017-07-31 DIAGNOSIS — Z720 Tobacco use: Secondary | ICD-10-CM | POA: Diagnosis not present

## 2017-07-31 DIAGNOSIS — G8918 Other acute postprocedural pain: Secondary | ICD-10-CM | POA: Diagnosis not present

## 2017-07-31 DIAGNOSIS — S91302A Unspecified open wound, left foot, initial encounter: Secondary | ICD-10-CM | POA: Diagnosis not present

## 2017-07-31 DIAGNOSIS — Z741 Need for assistance with personal care: Secondary | ICD-10-CM | POA: Diagnosis not present

## 2017-07-31 DIAGNOSIS — Z89422 Acquired absence of other left toe(s): Secondary | ICD-10-CM | POA: Diagnosis not present

## 2017-07-31 DIAGNOSIS — I70262 Atherosclerosis of native arteries of extremities with gangrene, left leg: Secondary | ICD-10-CM | POA: Diagnosis not present

## 2017-07-31 DIAGNOSIS — L97509 Non-pressure chronic ulcer of other part of unspecified foot with unspecified severity: Secondary | ICD-10-CM | POA: Diagnosis not present

## 2017-07-31 DIAGNOSIS — L03116 Cellulitis of left lower limb: Secondary | ICD-10-CM | POA: Diagnosis not present

## 2017-07-31 DIAGNOSIS — B9689 Other specified bacterial agents as the cause of diseases classified elsewhere: Secondary | ICD-10-CM | POA: Diagnosis not present

## 2017-07-31 DIAGNOSIS — I70212 Atherosclerosis of native arteries of extremities with intermittent claudication, left leg: Secondary | ICD-10-CM | POA: Diagnosis not present

## 2017-07-31 DIAGNOSIS — E1140 Type 2 diabetes mellitus with diabetic neuropathy, unspecified: Secondary | ICD-10-CM | POA: Diagnosis not present

## 2017-07-31 DIAGNOSIS — E1151 Type 2 diabetes mellitus with diabetic peripheral angiopathy without gangrene: Secondary | ICD-10-CM | POA: Diagnosis not present

## 2017-07-31 DIAGNOSIS — E11621 Type 2 diabetes mellitus with foot ulcer: Secondary | ICD-10-CM | POA: Diagnosis not present

## 2017-07-31 DIAGNOSIS — R278 Other lack of coordination: Secondary | ICD-10-CM | POA: Diagnosis not present

## 2017-07-31 DIAGNOSIS — E11628 Type 2 diabetes mellitus with other skin complications: Secondary | ICD-10-CM | POA: Diagnosis not present

## 2017-07-31 DIAGNOSIS — B999 Unspecified infectious disease: Secondary | ICD-10-CM | POA: Diagnosis not present

## 2017-07-31 DIAGNOSIS — Z89611 Acquired absence of right leg above knee: Secondary | ICD-10-CM | POA: Diagnosis not present

## 2017-07-31 DIAGNOSIS — E11649 Type 2 diabetes mellitus with hypoglycemia without coma: Secondary | ICD-10-CM | POA: Diagnosis not present

## 2017-07-31 DIAGNOSIS — I10 Essential (primary) hypertension: Secondary | ICD-10-CM | POA: Diagnosis not present

## 2017-07-31 DIAGNOSIS — Z7901 Long term (current) use of anticoagulants: Secondary | ICD-10-CM | POA: Diagnosis not present

## 2017-07-31 DIAGNOSIS — Z4781 Encounter for orthopedic aftercare following surgical amputation: Secondary | ICD-10-CM | POA: Diagnosis not present

## 2017-07-31 DIAGNOSIS — R52 Pain, unspecified: Secondary | ICD-10-CM | POA: Diagnosis not present

## 2017-07-31 DIAGNOSIS — E1165 Type 2 diabetes mellitus with hyperglycemia: Secondary | ICD-10-CM | POA: Diagnosis not present

## 2017-07-31 DIAGNOSIS — R69 Illness, unspecified: Secondary | ICD-10-CM | POA: Diagnosis not present

## 2017-07-31 DIAGNOSIS — L97529 Non-pressure chronic ulcer of other part of left foot with unspecified severity: Secondary | ICD-10-CM | POA: Diagnosis not present

## 2017-07-31 DIAGNOSIS — E1152 Type 2 diabetes mellitus with diabetic peripheral angiopathy with gangrene: Secondary | ICD-10-CM | POA: Diagnosis not present

## 2017-07-31 DIAGNOSIS — M79672 Pain in left foot: Secondary | ICD-10-CM | POA: Diagnosis not present

## 2017-07-31 DIAGNOSIS — M6250 Muscle wasting and atrophy, not elsewhere classified, unspecified site: Secondary | ICD-10-CM | POA: Diagnosis not present

## 2017-07-31 DIAGNOSIS — D72825 Bandemia: Secondary | ICD-10-CM | POA: Diagnosis not present

## 2017-07-31 DIAGNOSIS — I739 Peripheral vascular disease, unspecified: Secondary | ICD-10-CM | POA: Diagnosis not present

## 2017-07-31 DIAGNOSIS — R197 Diarrhea, unspecified: Secondary | ICD-10-CM | POA: Diagnosis not present

## 2017-07-31 DIAGNOSIS — M6281 Muscle weakness (generalized): Secondary | ICD-10-CM | POA: Diagnosis not present

## 2017-07-31 DIAGNOSIS — N179 Acute kidney failure, unspecified: Secondary | ICD-10-CM | POA: Diagnosis not present

## 2017-07-31 DIAGNOSIS — Z89432 Acquired absence of left foot: Secondary | ICD-10-CM | POA: Diagnosis not present

## 2017-07-31 DIAGNOSIS — E1159 Type 2 diabetes mellitus with other circulatory complications: Secondary | ICD-10-CM | POA: Diagnosis not present

## 2017-07-31 DIAGNOSIS — L03119 Cellulitis of unspecified part of limb: Secondary | ICD-10-CM | POA: Diagnosis not present

## 2017-07-31 DIAGNOSIS — I70245 Atherosclerosis of native arteries of left leg with ulceration of other part of foot: Secondary | ICD-10-CM | POA: Diagnosis not present

## 2017-07-31 DIAGNOSIS — Z7409 Other reduced mobility: Secondary | ICD-10-CM | POA: Diagnosis not present

## 2017-08-06 DIAGNOSIS — G8918 Other acute postprocedural pain: Secondary | ICD-10-CM | POA: Diagnosis not present

## 2017-08-11 DIAGNOSIS — I739 Peripheral vascular disease, unspecified: Secondary | ICD-10-CM | POA: Diagnosis not present

## 2017-08-11 DIAGNOSIS — N179 Acute kidney failure, unspecified: Secondary | ICD-10-CM | POA: Diagnosis not present

## 2017-08-11 DIAGNOSIS — L03116 Cellulitis of left lower limb: Secondary | ICD-10-CM | POA: Diagnosis not present

## 2017-08-11 DIAGNOSIS — L03119 Cellulitis of unspecified part of limb: Secondary | ICD-10-CM | POA: Diagnosis not present

## 2017-08-11 DIAGNOSIS — J449 Chronic obstructive pulmonary disease, unspecified: Secondary | ICD-10-CM | POA: Diagnosis not present

## 2017-08-11 DIAGNOSIS — Z951 Presence of aortocoronary bypass graft: Secondary | ICD-10-CM | POA: Diagnosis not present

## 2017-08-11 DIAGNOSIS — R69 Illness, unspecified: Secondary | ICD-10-CM | POA: Diagnosis not present

## 2017-08-11 DIAGNOSIS — Z89432 Acquired absence of left foot: Secondary | ICD-10-CM | POA: Diagnosis not present

## 2017-08-11 DIAGNOSIS — Z4781 Encounter for orthopedic aftercare following surgical amputation: Secondary | ICD-10-CM | POA: Diagnosis not present

## 2017-08-11 DIAGNOSIS — R278 Other lack of coordination: Secondary | ICD-10-CM | POA: Diagnosis not present

## 2017-08-11 DIAGNOSIS — Z794 Long term (current) use of insulin: Secondary | ICD-10-CM | POA: Diagnosis not present

## 2017-08-11 DIAGNOSIS — E1165 Type 2 diabetes mellitus with hyperglycemia: Secondary | ICD-10-CM | POA: Diagnosis not present

## 2017-08-11 DIAGNOSIS — B373 Candidiasis of vulva and vagina: Secondary | ICD-10-CM | POA: Diagnosis not present

## 2017-08-11 DIAGNOSIS — E1151 Type 2 diabetes mellitus with diabetic peripheral angiopathy without gangrene: Secondary | ICD-10-CM | POA: Diagnosis not present

## 2017-08-11 DIAGNOSIS — I10 Essential (primary) hypertension: Secondary | ICD-10-CM | POA: Diagnosis not present

## 2017-08-11 DIAGNOSIS — M6281 Muscle weakness (generalized): Secondary | ICD-10-CM | POA: Diagnosis not present

## 2017-08-11 DIAGNOSIS — E119 Type 2 diabetes mellitus without complications: Secondary | ICD-10-CM | POA: Diagnosis not present

## 2017-08-11 DIAGNOSIS — B999 Unspecified infectious disease: Secondary | ICD-10-CM | POA: Diagnosis not present

## 2017-08-11 DIAGNOSIS — Z89422 Acquired absence of other left toe(s): Secondary | ICD-10-CM | POA: Diagnosis not present

## 2017-08-11 DIAGNOSIS — Z720 Tobacco use: Secondary | ICD-10-CM | POA: Diagnosis not present

## 2017-08-11 DIAGNOSIS — M6250 Muscle wasting and atrophy, not elsewhere classified, unspecified site: Secondary | ICD-10-CM | POA: Diagnosis not present

## 2017-08-11 DIAGNOSIS — E11628 Type 2 diabetes mellitus with other skin complications: Secondary | ICD-10-CM | POA: Diagnosis not present

## 2017-08-11 DIAGNOSIS — Z741 Need for assistance with personal care: Secondary | ICD-10-CM | POA: Diagnosis not present

## 2017-08-13 DIAGNOSIS — E119 Type 2 diabetes mellitus without complications: Secondary | ICD-10-CM | POA: Diagnosis not present

## 2017-08-13 DIAGNOSIS — Z89432 Acquired absence of left foot: Secondary | ICD-10-CM | POA: Diagnosis not present

## 2017-08-13 DIAGNOSIS — J449 Chronic obstructive pulmonary disease, unspecified: Secondary | ICD-10-CM | POA: Diagnosis not present

## 2017-08-13 DIAGNOSIS — I10 Essential (primary) hypertension: Secondary | ICD-10-CM | POA: Diagnosis not present

## 2017-08-16 DIAGNOSIS — I10 Essential (primary) hypertension: Secondary | ICD-10-CM | POA: Diagnosis not present

## 2017-08-16 DIAGNOSIS — R69 Illness, unspecified: Secondary | ICD-10-CM | POA: Diagnosis not present

## 2017-08-16 DIAGNOSIS — I739 Peripheral vascular disease, unspecified: Secondary | ICD-10-CM | POA: Diagnosis not present

## 2017-08-16 DIAGNOSIS — Z720 Tobacco use: Secondary | ICD-10-CM | POA: Diagnosis not present

## 2017-08-18 DIAGNOSIS — B373 Candidiasis of vulva and vagina: Secondary | ICD-10-CM | POA: Diagnosis not present

## 2017-08-20 DIAGNOSIS — I10 Essential (primary) hypertension: Secondary | ICD-10-CM | POA: Diagnosis not present

## 2017-08-20 DIAGNOSIS — Z89432 Acquired absence of left foot: Secondary | ICD-10-CM | POA: Diagnosis not present

## 2017-08-20 DIAGNOSIS — E119 Type 2 diabetes mellitus without complications: Secondary | ICD-10-CM | POA: Diagnosis not present

## 2017-08-20 DIAGNOSIS — B373 Candidiasis of vulva and vagina: Secondary | ICD-10-CM | POA: Diagnosis not present

## 2017-08-26 DIAGNOSIS — J449 Chronic obstructive pulmonary disease, unspecified: Secondary | ICD-10-CM | POA: Diagnosis not present

## 2017-08-26 DIAGNOSIS — I2510 Atherosclerotic heart disease of native coronary artery without angina pectoris: Secondary | ICD-10-CM | POA: Diagnosis not present

## 2017-08-26 DIAGNOSIS — G894 Chronic pain syndrome: Secondary | ICD-10-CM | POA: Diagnosis not present

## 2017-08-26 DIAGNOSIS — M47816 Spondylosis without myelopathy or radiculopathy, lumbar region: Secondary | ICD-10-CM | POA: Diagnosis not present

## 2017-08-26 DIAGNOSIS — E119 Type 2 diabetes mellitus without complications: Secondary | ICD-10-CM | POA: Diagnosis not present

## 2017-08-26 DIAGNOSIS — M5416 Radiculopathy, lumbar region: Secondary | ICD-10-CM | POA: Diagnosis not present

## 2017-08-26 DIAGNOSIS — M961 Postlaminectomy syndrome, not elsewhere classified: Secondary | ICD-10-CM | POA: Diagnosis not present

## 2017-09-02 DIAGNOSIS — Z89432 Acquired absence of left foot: Secondary | ICD-10-CM | POA: Insufficient documentation

## 2017-09-23 DIAGNOSIS — M47816 Spondylosis without myelopathy or radiculopathy, lumbar region: Secondary | ICD-10-CM | POA: Diagnosis not present

## 2017-09-23 DIAGNOSIS — G894 Chronic pain syndrome: Secondary | ICD-10-CM | POA: Diagnosis not present

## 2017-09-23 DIAGNOSIS — I2510 Atherosclerotic heart disease of native coronary artery without angina pectoris: Secondary | ICD-10-CM | POA: Diagnosis not present

## 2017-09-23 DIAGNOSIS — E119 Type 2 diabetes mellitus without complications: Secondary | ICD-10-CM | POA: Diagnosis not present

## 2017-09-23 DIAGNOSIS — M961 Postlaminectomy syndrome, not elsewhere classified: Secondary | ICD-10-CM | POA: Diagnosis not present

## 2017-09-23 DIAGNOSIS — M5416 Radiculopathy, lumbar region: Secondary | ICD-10-CM | POA: Diagnosis not present

## 2017-09-23 DIAGNOSIS — J449 Chronic obstructive pulmonary disease, unspecified: Secondary | ICD-10-CM | POA: Diagnosis not present

## 2017-09-27 IMAGING — CT CT ABD-PELV W/ CM
2 of 5 series · 17 of 46 positions shown, 19 images · IV contrast (Omni 300)
Comparison: 10/13/2016

CLINICAL DATA: Abdominal pain.  Nausea and vomiting.

EXAM:
CT ABDOMEN AND PELVIS WITH CONTRAST
TECHNIQUE: Multidetector CT imaging of the abdomen and pelvis was performed
using the standard protocol following bolus administration of
intravenous contrast.
CONTRAST:  100mL SIE2ZV-BCC IOPAMIDOL (SIE2ZV-BCC) INJECTION 61%

[Series 3: a/p w/ 5mm · axial · 0.96mm/px · z∈[-484,-84]mm · 14 of 91 slices shown, 16 images]
[im 6/91  soft-tissue]
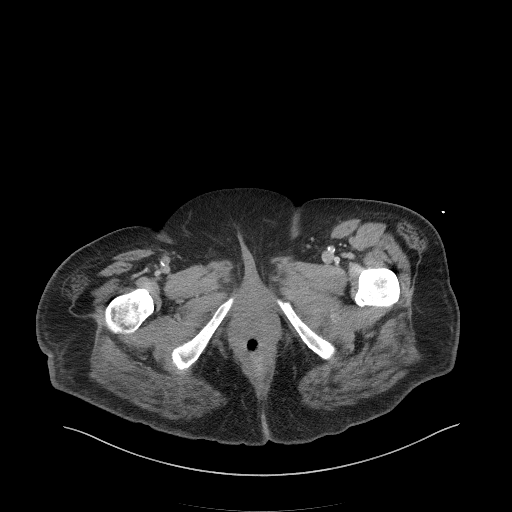
[im 6/91  bone]
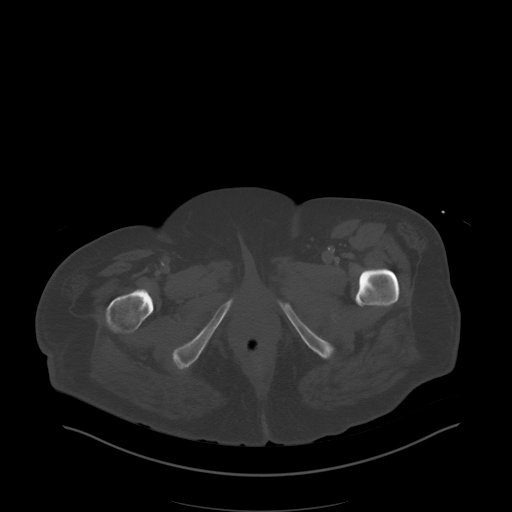
[im 11/91  soft-tissue]
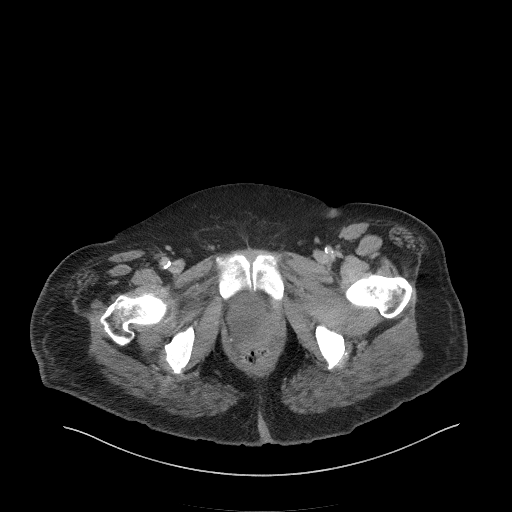
[im 21/91  soft-tissue]
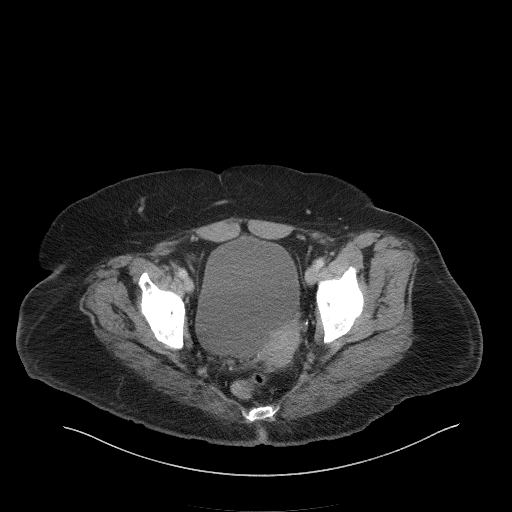
[im 26/91  soft-tissue]
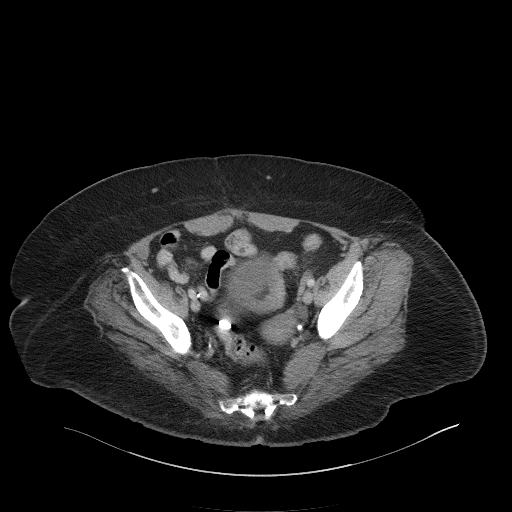
[im 31/91  soft-tissue]
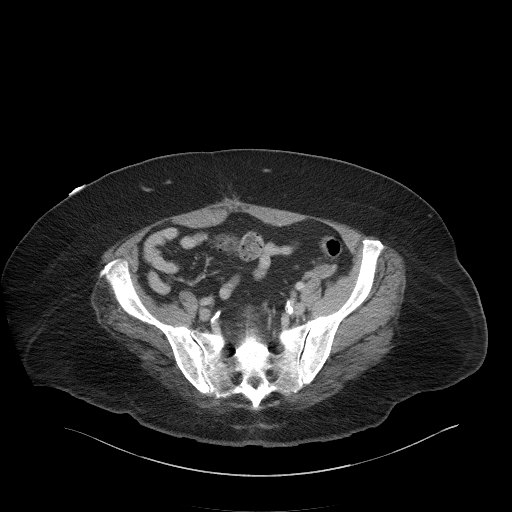
[im 36/91  soft-tissue]
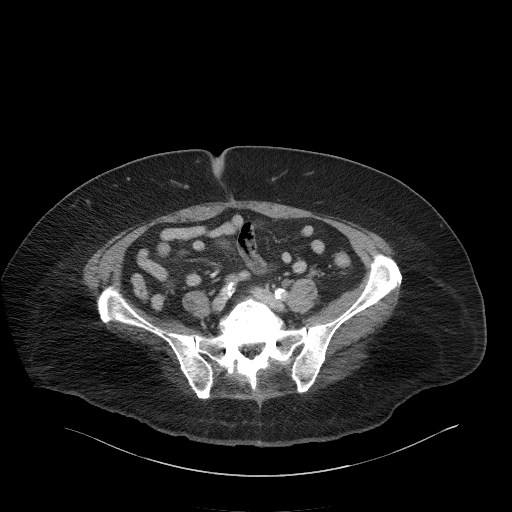
[im 41/91  soft-tissue]
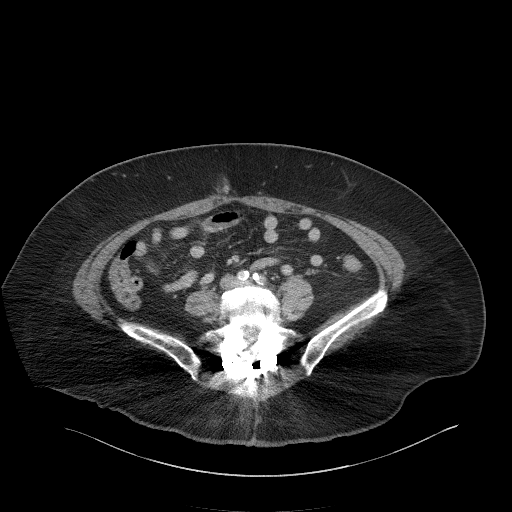
[im 51/91  soft-tissue]
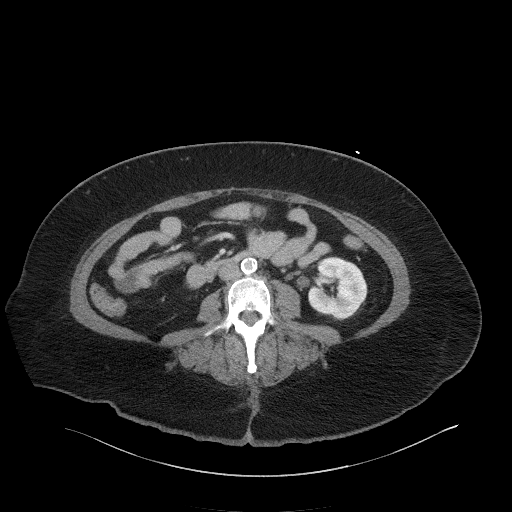
[im 56/91  soft-tissue]
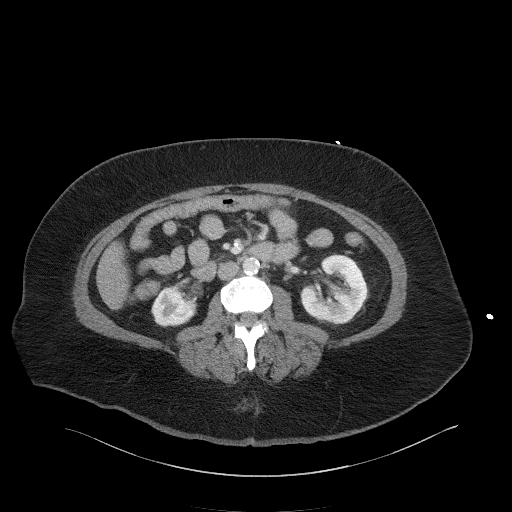
[im 56/91  bone]
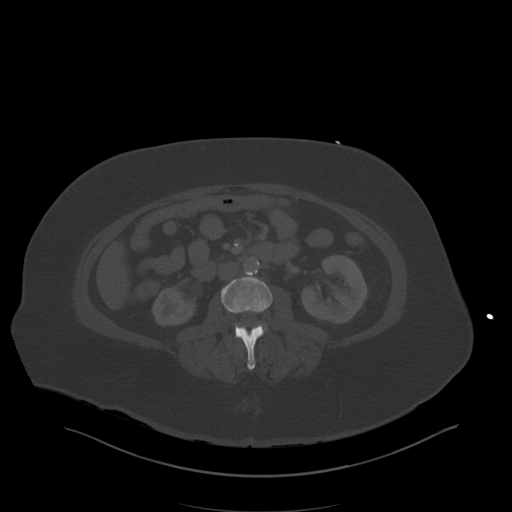
[im 61/91  soft-tissue]
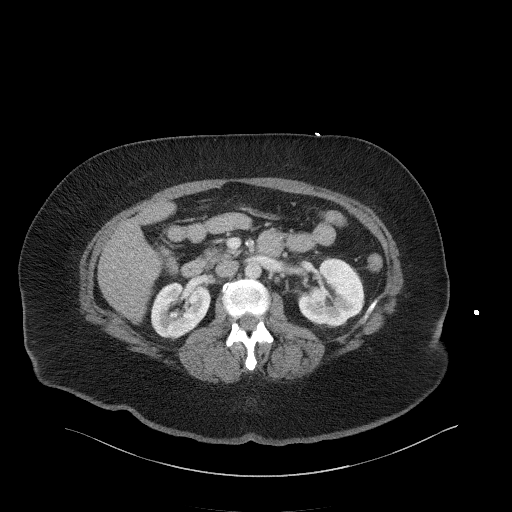
[im 66/91  soft-tissue]
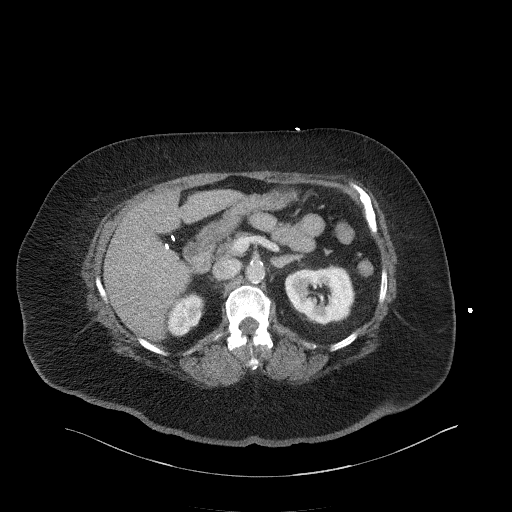
[im 71/91  soft-tissue]
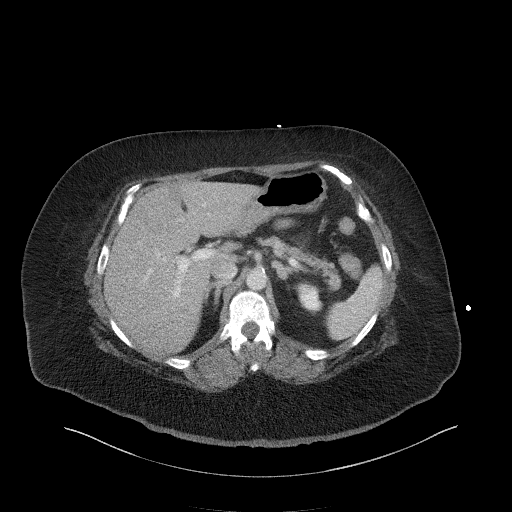
[im 81/91  soft-tissue]
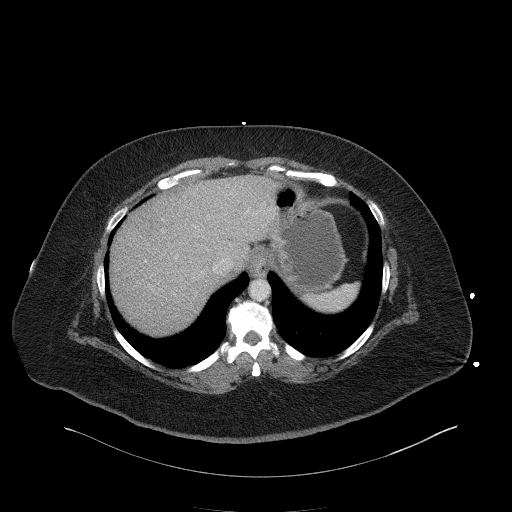
[im 86/91  soft-tissue]
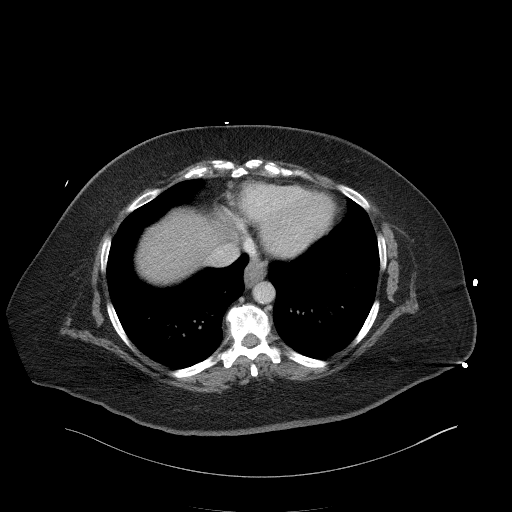

[Series 6: a/p w/ cor · coronal · 0.88mm/px · 3 of 168 slices shown]
[im 56/168  soft-tissue]
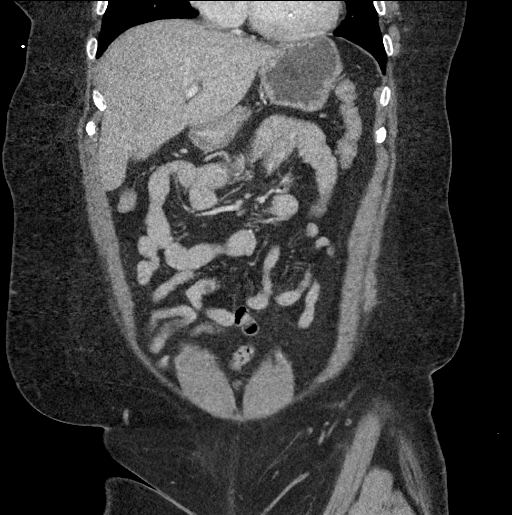
[im 75/168  soft-tissue]
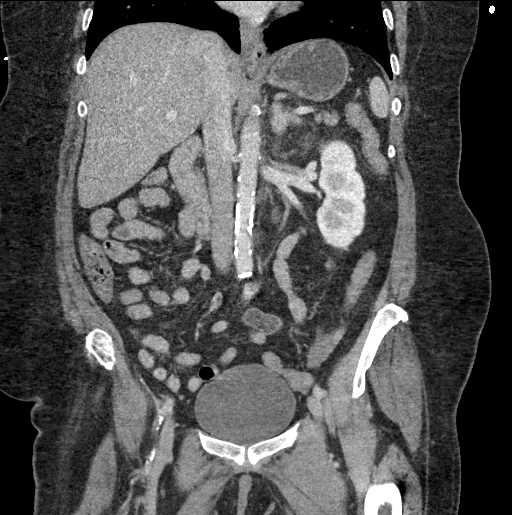
[im 93/168  soft-tissue]
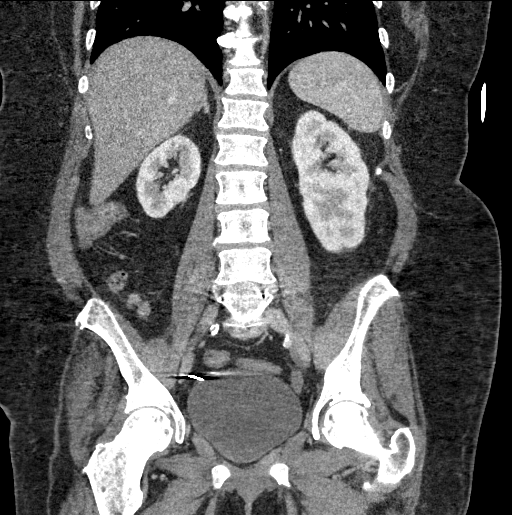

[17 of 46 positions shown; findings below may reference images not displayed]

FINDINGS: Lower chest: No acute abnormality.

Hepatobiliary: No focal liver abnormality is seen. Status post
cholecystectomy. No biliary dilatation.

Pancreas: Unremarkable. No pancreatic ductal dilatation or
surrounding inflammatory changes.

Spleen: Normal in size without focal abnormality.

Adrenals/Urinary Tract: Normal appearance of the adrenal glands.
Mild asymmetric atrophy of the right kidney. There is a duplicated
right renal collecting system with atrophy of the inferior pole. No
mass or hydronephrosis identified. The urinary bladder appears
normal.

Stomach/Bowel: The stomach and small bowel loops are unremarkable.
The appendix is not visualize. Mild diffuse colonic wall thickening
likely reflects incomplete distention.

Vascular/Lymphatic: Aortic aortic atherosclerosis. No aneurysm. No
abdominal or pelvic adenopathy.

Reproductive: The uterus and the adnexal structures are
unremarkable.

Other: There is no ascites or focal fluid collections within the
abdomen or pelvis.

Musculoskeletal: Status post fusion of L5-S1.
IMPRESSION: 1. No definite acute findings identified within the abdomen or
pelvis.
2. Mild diffuse bladder wall thickening is favored to represent
incomplete distention. No surrounding inflammatory change noted.
3.  Aortic Atherosclerosis ([id number]).

## 2017-09-28 DIAGNOSIS — R0989 Other specified symptoms and signs involving the circulatory and respiratory systems: Secondary | ICD-10-CM | POA: Diagnosis not present

## 2017-09-28 DIAGNOSIS — E1143 Type 2 diabetes mellitus with diabetic autonomic (poly)neuropathy: Secondary | ICD-10-CM | POA: Diagnosis not present

## 2017-09-28 DIAGNOSIS — Z89611 Acquired absence of right leg above knee: Secondary | ICD-10-CM | POA: Diagnosis not present

## 2017-09-28 DIAGNOSIS — I739 Peripheral vascular disease, unspecified: Secondary | ICD-10-CM | POA: Diagnosis not present

## 2017-09-28 DIAGNOSIS — I10 Essential (primary) hypertension: Secondary | ICD-10-CM | POA: Diagnosis not present

## 2017-09-28 DIAGNOSIS — I208 Other forms of angina pectoris: Secondary | ICD-10-CM | POA: Diagnosis not present

## 2017-09-28 DIAGNOSIS — J441 Chronic obstructive pulmonary disease with (acute) exacerbation: Secondary | ICD-10-CM | POA: Diagnosis not present

## 2017-09-28 DIAGNOSIS — I2510 Atherosclerotic heart disease of native coronary artery without angina pectoris: Secondary | ICD-10-CM | POA: Diagnosis not present

## 2017-09-28 DIAGNOSIS — E1142 Type 2 diabetes mellitus with diabetic polyneuropathy: Secondary | ICD-10-CM | POA: Diagnosis not present

## 2017-09-28 DIAGNOSIS — M545 Low back pain: Secondary | ICD-10-CM | POA: Diagnosis not present

## 2017-09-28 DIAGNOSIS — Z794 Long term (current) use of insulin: Secondary | ICD-10-CM | POA: Diagnosis not present

## 2017-09-28 DIAGNOSIS — E118 Type 2 diabetes mellitus with unspecified complications: Secondary | ICD-10-CM | POA: Diagnosis not present

## 2017-10-21 DIAGNOSIS — E119 Type 2 diabetes mellitus without complications: Secondary | ICD-10-CM | POA: Diagnosis not present

## 2017-10-21 DIAGNOSIS — J449 Chronic obstructive pulmonary disease, unspecified: Secondary | ICD-10-CM | POA: Diagnosis not present

## 2017-10-21 DIAGNOSIS — G894 Chronic pain syndrome: Secondary | ICD-10-CM | POA: Diagnosis not present

## 2017-10-21 DIAGNOSIS — I2510 Atherosclerotic heart disease of native coronary artery without angina pectoris: Secondary | ICD-10-CM | POA: Diagnosis not present

## 2017-10-21 DIAGNOSIS — M961 Postlaminectomy syndrome, not elsewhere classified: Secondary | ICD-10-CM | POA: Diagnosis not present

## 2017-10-21 DIAGNOSIS — M5416 Radiculopathy, lumbar region: Secondary | ICD-10-CM | POA: Diagnosis not present

## 2017-10-21 DIAGNOSIS — M47816 Spondylosis without myelopathy or radiculopathy, lumbar region: Secondary | ICD-10-CM | POA: Diagnosis not present

## 2017-10-24 DIAGNOSIS — E1143 Type 2 diabetes mellitus with diabetic autonomic (poly)neuropathy: Secondary | ICD-10-CM | POA: Diagnosis not present

## 2017-10-24 DIAGNOSIS — I252 Old myocardial infarction: Secondary | ICD-10-CM | POA: Diagnosis not present

## 2017-10-24 DIAGNOSIS — R0602 Shortness of breath: Secondary | ICD-10-CM | POA: Diagnosis not present

## 2017-10-24 DIAGNOSIS — E1110 Type 2 diabetes mellitus with ketoacidosis without coma: Secondary | ICD-10-CM | POA: Diagnosis not present

## 2017-10-24 DIAGNOSIS — Z794 Long term (current) use of insulin: Secondary | ICD-10-CM | POA: Diagnosis not present

## 2017-10-24 DIAGNOSIS — Z951 Presence of aortocoronary bypass graft: Secondary | ICD-10-CM | POA: Diagnosis not present

## 2017-10-24 DIAGNOSIS — Z89611 Acquired absence of right leg above knee: Secondary | ICD-10-CM | POA: Diagnosis not present

## 2017-10-24 DIAGNOSIS — E860 Dehydration: Secondary | ICD-10-CM | POA: Diagnosis not present

## 2017-10-24 DIAGNOSIS — K3184 Gastroparesis: Secondary | ICD-10-CM | POA: Diagnosis not present

## 2017-10-24 DIAGNOSIS — Y835 Amputation of limb(s) as the cause of abnormal reaction of the patient, or of later complication, without mention of misadventure at the time of the procedure: Secondary | ICD-10-CM | POA: Diagnosis not present

## 2017-10-24 DIAGNOSIS — R1111 Vomiting without nausea: Secondary | ICD-10-CM | POA: Diagnosis not present

## 2017-10-24 DIAGNOSIS — K314 Gastric diverticulum: Secondary | ICD-10-CM | POA: Diagnosis not present

## 2017-10-24 DIAGNOSIS — E1165 Type 2 diabetes mellitus with hyperglycemia: Secondary | ICD-10-CM | POA: Diagnosis not present

## 2017-10-24 DIAGNOSIS — Z8673 Personal history of transient ischemic attack (TIA), and cerebral infarction without residual deficits: Secondary | ICD-10-CM | POA: Diagnosis not present

## 2017-10-24 DIAGNOSIS — I2510 Atherosclerotic heart disease of native coronary artery without angina pectoris: Secondary | ICD-10-CM | POA: Diagnosis not present

## 2017-10-25 DIAGNOSIS — K314 Gastric diverticulum: Secondary | ICD-10-CM | POA: Diagnosis not present

## 2017-10-25 DIAGNOSIS — E1165 Type 2 diabetes mellitus with hyperglycemia: Secondary | ICD-10-CM | POA: Diagnosis not present

## 2017-10-25 DIAGNOSIS — E1143 Type 2 diabetes mellitus with diabetic autonomic (poly)neuropathy: Secondary | ICD-10-CM | POA: Diagnosis not present

## 2017-10-29 DIAGNOSIS — E119 Type 2 diabetes mellitus without complications: Secondary | ICD-10-CM | POA: Diagnosis not present

## 2017-10-29 DIAGNOSIS — R072 Precordial pain: Secondary | ICD-10-CM | POA: Diagnosis not present

## 2017-10-29 DIAGNOSIS — R112 Nausea with vomiting, unspecified: Secondary | ICD-10-CM | POA: Diagnosis not present

## 2017-10-29 DIAGNOSIS — K3184 Gastroparesis: Secondary | ICD-10-CM | POA: Diagnosis not present

## 2017-10-29 DIAGNOSIS — K209 Esophagitis, unspecified: Secondary | ICD-10-CM | POA: Diagnosis not present

## 2017-10-30 DIAGNOSIS — E860 Dehydration: Secondary | ICD-10-CM | POA: Diagnosis not present

## 2017-10-30 DIAGNOSIS — Z452 Encounter for adjustment and management of vascular access device: Secondary | ICD-10-CM | POA: Diagnosis not present

## 2017-10-30 DIAGNOSIS — R109 Unspecified abdominal pain: Secondary | ICD-10-CM | POA: Diagnosis not present

## 2017-10-30 DIAGNOSIS — K3184 Gastroparesis: Secondary | ICD-10-CM | POA: Diagnosis not present

## 2017-10-30 DIAGNOSIS — E1169 Type 2 diabetes mellitus with other specified complication: Secondary | ICD-10-CM | POA: Diagnosis not present

## 2017-10-30 DIAGNOSIS — I5022 Chronic systolic (congestive) heart failure: Secondary | ICD-10-CM | POA: Diagnosis not present

## 2017-10-30 DIAGNOSIS — K209 Esophagitis, unspecified: Secondary | ICD-10-CM | POA: Diagnosis not present

## 2017-10-30 DIAGNOSIS — K210 Gastro-esophageal reflux disease with esophagitis: Secondary | ICD-10-CM | POA: Diagnosis not present

## 2017-10-30 DIAGNOSIS — Z89619 Acquired absence of unspecified leg above knee: Secondary | ICD-10-CM | POA: Diagnosis not present

## 2017-10-30 DIAGNOSIS — M549 Dorsalgia, unspecified: Secondary | ICD-10-CM

## 2017-10-30 DIAGNOSIS — E1165 Type 2 diabetes mellitus with hyperglycemia: Secondary | ICD-10-CM | POA: Diagnosis not present

## 2017-10-30 DIAGNOSIS — N179 Acute kidney failure, unspecified: Secondary | ICD-10-CM | POA: Diagnosis not present

## 2017-10-30 DIAGNOSIS — R1110 Vomiting, unspecified: Secondary | ICD-10-CM | POA: Diagnosis not present

## 2017-10-30 DIAGNOSIS — R112 Nausea with vomiting, unspecified: Secondary | ICD-10-CM | POA: Diagnosis not present

## 2017-10-30 DIAGNOSIS — K2970 Gastritis, unspecified, without bleeding: Secondary | ICD-10-CM | POA: Diagnosis not present

## 2017-10-30 DIAGNOSIS — K219 Gastro-esophageal reflux disease without esophagitis: Secondary | ICD-10-CM

## 2017-10-30 DIAGNOSIS — K449 Diaphragmatic hernia without obstruction or gangrene: Secondary | ICD-10-CM | POA: Diagnosis not present

## 2017-10-30 DIAGNOSIS — Z720 Tobacco use: Secondary | ICD-10-CM

## 2017-10-30 DIAGNOSIS — Z951 Presence of aortocoronary bypass graft: Secondary | ICD-10-CM | POA: Diagnosis not present

## 2017-10-30 DIAGNOSIS — I999 Unspecified disorder of circulatory system: Secondary | ICD-10-CM | POA: Diagnosis not present

## 2017-10-30 DIAGNOSIS — K208 Other esophagitis: Secondary | ICD-10-CM | POA: Diagnosis not present

## 2017-10-30 DIAGNOSIS — R079 Chest pain, unspecified: Secondary | ICD-10-CM | POA: Diagnosis not present

## 2017-10-30 DIAGNOSIS — R072 Precordial pain: Secondary | ICD-10-CM | POA: Diagnosis not present

## 2017-10-30 DIAGNOSIS — R1011 Right upper quadrant pain: Secondary | ICD-10-CM | POA: Diagnosis not present

## 2017-10-30 DIAGNOSIS — E119 Type 2 diabetes mellitus without complications: Secondary | ICD-10-CM | POA: Diagnosis not present

## 2017-10-30 DIAGNOSIS — E1143 Type 2 diabetes mellitus with diabetic autonomic (poly)neuropathy: Secondary | ICD-10-CM | POA: Diagnosis not present

## 2017-10-30 DIAGNOSIS — R1084 Generalized abdominal pain: Secondary | ICD-10-CM | POA: Diagnosis not present

## 2017-10-30 DIAGNOSIS — R110 Nausea: Secondary | ICD-10-CM | POA: Diagnosis not present

## 2017-11-24 DIAGNOSIS — G894 Chronic pain syndrome: Secondary | ICD-10-CM | POA: Diagnosis not present

## 2017-11-24 DIAGNOSIS — M961 Postlaminectomy syndrome, not elsewhere classified: Secondary | ICD-10-CM | POA: Diagnosis not present

## 2017-11-24 DIAGNOSIS — I2510 Atherosclerotic heart disease of native coronary artery without angina pectoris: Secondary | ICD-10-CM | POA: Diagnosis not present

## 2017-11-24 DIAGNOSIS — M5416 Radiculopathy, lumbar region: Secondary | ICD-10-CM | POA: Diagnosis not present

## 2017-11-24 DIAGNOSIS — M47816 Spondylosis without myelopathy or radiculopathy, lumbar region: Secondary | ICD-10-CM | POA: Diagnosis not present

## 2017-11-24 DIAGNOSIS — E119 Type 2 diabetes mellitus without complications: Secondary | ICD-10-CM | POA: Diagnosis not present

## 2017-11-24 DIAGNOSIS — J449 Chronic obstructive pulmonary disease, unspecified: Secondary | ICD-10-CM | POA: Diagnosis not present

## 2017-11-29 DIAGNOSIS — E119 Type 2 diabetes mellitus without complications: Secondary | ICD-10-CM | POA: Diagnosis not present

## 2017-11-29 DIAGNOSIS — Z89432 Acquired absence of left foot: Secondary | ICD-10-CM | POA: Diagnosis not present

## 2017-11-29 DIAGNOSIS — L97522 Non-pressure chronic ulcer of other part of left foot with fat layer exposed: Secondary | ICD-10-CM | POA: Diagnosis not present

## 2017-11-29 DIAGNOSIS — L97529 Non-pressure chronic ulcer of other part of left foot with unspecified severity: Secondary | ICD-10-CM | POA: Insufficient documentation

## 2017-12-07 DIAGNOSIS — Z1231 Encounter for screening mammogram for malignant neoplasm of breast: Secondary | ICD-10-CM | POA: Diagnosis not present

## 2017-12-22 DIAGNOSIS — M961 Postlaminectomy syndrome, not elsewhere classified: Secondary | ICD-10-CM | POA: Diagnosis not present

## 2017-12-22 DIAGNOSIS — J449 Chronic obstructive pulmonary disease, unspecified: Secondary | ICD-10-CM | POA: Diagnosis not present

## 2017-12-22 DIAGNOSIS — E119 Type 2 diabetes mellitus without complications: Secondary | ICD-10-CM | POA: Diagnosis not present

## 2017-12-22 DIAGNOSIS — M47816 Spondylosis without myelopathy or radiculopathy, lumbar region: Secondary | ICD-10-CM | POA: Diagnosis not present

## 2017-12-22 DIAGNOSIS — G894 Chronic pain syndrome: Secondary | ICD-10-CM | POA: Diagnosis not present

## 2017-12-22 DIAGNOSIS — I2510 Atherosclerotic heart disease of native coronary artery without angina pectoris: Secondary | ICD-10-CM | POA: Diagnosis not present

## 2017-12-22 DIAGNOSIS — M5416 Radiculopathy, lumbar region: Secondary | ICD-10-CM | POA: Diagnosis not present

## 2017-12-29 DIAGNOSIS — I214 Non-ST elevation (NSTEMI) myocardial infarction: Secondary | ICD-10-CM | POA: Diagnosis not present

## 2017-12-29 DIAGNOSIS — E782 Mixed hyperlipidemia: Secondary | ICD-10-CM | POA: Diagnosis not present

## 2017-12-29 DIAGNOSIS — E118 Type 2 diabetes mellitus with unspecified complications: Secondary | ICD-10-CM | POA: Diagnosis not present

## 2017-12-29 DIAGNOSIS — E1143 Type 2 diabetes mellitus with diabetic autonomic (poly)neuropathy: Secondary | ICD-10-CM | POA: Diagnosis not present

## 2017-12-29 DIAGNOSIS — Z794 Long term (current) use of insulin: Secondary | ICD-10-CM | POA: Diagnosis not present

## 2017-12-29 DIAGNOSIS — M545 Low back pain: Secondary | ICD-10-CM | POA: Diagnosis not present

## 2017-12-29 DIAGNOSIS — I10 Essential (primary) hypertension: Secondary | ICD-10-CM | POA: Diagnosis not present

## 2017-12-29 DIAGNOSIS — I739 Peripheral vascular disease, unspecified: Secondary | ICD-10-CM | POA: Diagnosis not present

## 2017-12-29 DIAGNOSIS — I2510 Atherosclerotic heart disease of native coronary artery without angina pectoris: Secondary | ICD-10-CM | POA: Diagnosis not present

## 2017-12-29 DIAGNOSIS — E1142 Type 2 diabetes mellitus with diabetic polyneuropathy: Secondary | ICD-10-CM | POA: Diagnosis not present

## 2017-12-29 DIAGNOSIS — Z0001 Encounter for general adult medical examination with abnormal findings: Secondary | ICD-10-CM | POA: Diagnosis not present

## 2017-12-29 DIAGNOSIS — J432 Centrilobular emphysema: Secondary | ICD-10-CM | POA: Diagnosis not present

## 2017-12-29 DIAGNOSIS — I5021 Acute systolic (congestive) heart failure: Secondary | ICD-10-CM | POA: Diagnosis not present

## 2018-01-26 DIAGNOSIS — G894 Chronic pain syndrome: Secondary | ICD-10-CM | POA: Diagnosis not present

## 2018-01-26 DIAGNOSIS — M47816 Spondylosis without myelopathy or radiculopathy, lumbar region: Secondary | ICD-10-CM | POA: Diagnosis not present

## 2018-01-26 DIAGNOSIS — I2510 Atherosclerotic heart disease of native coronary artery without angina pectoris: Secondary | ICD-10-CM | POA: Diagnosis not present

## 2018-01-26 DIAGNOSIS — M5416 Radiculopathy, lumbar region: Secondary | ICD-10-CM | POA: Diagnosis not present

## 2018-01-26 DIAGNOSIS — Z79891 Long term (current) use of opiate analgesic: Secondary | ICD-10-CM | POA: Diagnosis not present

## 2018-01-26 DIAGNOSIS — E119 Type 2 diabetes mellitus without complications: Secondary | ICD-10-CM | POA: Diagnosis not present

## 2018-01-26 DIAGNOSIS — M961 Postlaminectomy syndrome, not elsewhere classified: Secondary | ICD-10-CM | POA: Diagnosis not present

## 2018-01-26 DIAGNOSIS — J449 Chronic obstructive pulmonary disease, unspecified: Secondary | ICD-10-CM | POA: Diagnosis not present

## 2018-01-27 DIAGNOSIS — R1030 Lower abdominal pain, unspecified: Secondary | ICD-10-CM | POA: Diagnosis not present

## 2018-01-27 DIAGNOSIS — R1084 Generalized abdominal pain: Secondary | ICD-10-CM | POA: Diagnosis not present

## 2018-01-27 DIAGNOSIS — E119 Type 2 diabetes mellitus without complications: Secondary | ICD-10-CM | POA: Diagnosis not present

## 2018-01-27 DIAGNOSIS — R109 Unspecified abdominal pain: Secondary | ICD-10-CM | POA: Diagnosis not present

## 2018-03-02 DIAGNOSIS — M961 Postlaminectomy syndrome, not elsewhere classified: Secondary | ICD-10-CM | POA: Diagnosis not present

## 2018-03-02 DIAGNOSIS — M47816 Spondylosis without myelopathy or radiculopathy, lumbar region: Secondary | ICD-10-CM | POA: Diagnosis not present

## 2018-03-02 DIAGNOSIS — Z79891 Long term (current) use of opiate analgesic: Secondary | ICD-10-CM | POA: Diagnosis not present

## 2018-03-02 DIAGNOSIS — E119 Type 2 diabetes mellitus without complications: Secondary | ICD-10-CM | POA: Diagnosis not present

## 2018-03-02 DIAGNOSIS — G894 Chronic pain syndrome: Secondary | ICD-10-CM | POA: Diagnosis not present

## 2018-03-02 DIAGNOSIS — J449 Chronic obstructive pulmonary disease, unspecified: Secondary | ICD-10-CM | POA: Diagnosis not present

## 2018-03-02 DIAGNOSIS — M5416 Radiculopathy, lumbar region: Secondary | ICD-10-CM | POA: Diagnosis not present

## 2018-03-02 DIAGNOSIS — I2510 Atherosclerotic heart disease of native coronary artery without angina pectoris: Secondary | ICD-10-CM | POA: Diagnosis not present

## 2018-03-15 DIAGNOSIS — E1165 Type 2 diabetes mellitus with hyperglycemia: Secondary | ICD-10-CM | POA: Diagnosis not present

## 2018-04-01 DIAGNOSIS — M47816 Spondylosis without myelopathy or radiculopathy, lumbar region: Secondary | ICD-10-CM | POA: Diagnosis not present

## 2018-04-01 DIAGNOSIS — M961 Postlaminectomy syndrome, not elsewhere classified: Secondary | ICD-10-CM | POA: Diagnosis not present

## 2018-04-01 DIAGNOSIS — G894 Chronic pain syndrome: Secondary | ICD-10-CM | POA: Diagnosis not present

## 2018-04-01 DIAGNOSIS — Z79891 Long term (current) use of opiate analgesic: Secondary | ICD-10-CM | POA: Diagnosis not present

## 2018-04-01 DIAGNOSIS — I2510 Atherosclerotic heart disease of native coronary artery without angina pectoris: Secondary | ICD-10-CM | POA: Diagnosis not present

## 2018-04-01 DIAGNOSIS — M5416 Radiculopathy, lumbar region: Secondary | ICD-10-CM | POA: Diagnosis not present

## 2018-04-01 DIAGNOSIS — E119 Type 2 diabetes mellitus without complications: Secondary | ICD-10-CM | POA: Diagnosis not present

## 2018-04-01 DIAGNOSIS — J449 Chronic obstructive pulmonary disease, unspecified: Secondary | ICD-10-CM | POA: Diagnosis not present

## 2018-04-10 DIAGNOSIS — E1143 Type 2 diabetes mellitus with diabetic autonomic (poly)neuropathy: Secondary | ICD-10-CM | POA: Diagnosis not present

## 2018-04-10 DIAGNOSIS — N200 Calculus of kidney: Secondary | ICD-10-CM | POA: Diagnosis not present

## 2018-04-10 DIAGNOSIS — K3184 Gastroparesis: Secondary | ICD-10-CM | POA: Diagnosis not present

## 2018-04-20 DIAGNOSIS — I739 Peripheral vascular disease, unspecified: Secondary | ICD-10-CM | POA: Diagnosis not present

## 2018-04-20 DIAGNOSIS — I214 Non-ST elevation (NSTEMI) myocardial infarction: Secondary | ICD-10-CM | POA: Diagnosis not present

## 2018-04-20 DIAGNOSIS — N183 Chronic kidney disease, stage 3 (moderate): Secondary | ICD-10-CM | POA: Diagnosis not present

## 2018-04-20 DIAGNOSIS — M545 Low back pain: Secondary | ICD-10-CM | POA: Diagnosis not present

## 2018-04-20 DIAGNOSIS — E1142 Type 2 diabetes mellitus with diabetic polyneuropathy: Secondary | ICD-10-CM | POA: Diagnosis not present

## 2018-04-20 DIAGNOSIS — E1143 Type 2 diabetes mellitus with diabetic autonomic (poly)neuropathy: Secondary | ICD-10-CM | POA: Diagnosis not present

## 2018-04-20 DIAGNOSIS — I10 Essential (primary) hypertension: Secondary | ICD-10-CM | POA: Diagnosis not present

## 2018-04-20 DIAGNOSIS — E118 Type 2 diabetes mellitus with unspecified complications: Secondary | ICD-10-CM | POA: Diagnosis not present

## 2018-04-20 DIAGNOSIS — Z794 Long term (current) use of insulin: Secondary | ICD-10-CM | POA: Diagnosis not present

## 2018-04-20 DIAGNOSIS — J441 Chronic obstructive pulmonary disease with (acute) exacerbation: Secondary | ICD-10-CM | POA: Diagnosis not present

## 2018-04-20 DIAGNOSIS — Z23 Encounter for immunization: Secondary | ICD-10-CM | POA: Diagnosis not present

## 2018-04-20 DIAGNOSIS — R0989 Other specified symptoms and signs involving the circulatory and respiratory systems: Secondary | ICD-10-CM | POA: Diagnosis not present

## 2018-05-02 DIAGNOSIS — E119 Type 2 diabetes mellitus without complications: Secondary | ICD-10-CM | POA: Diagnosis not present

## 2018-05-02 DIAGNOSIS — M5416 Radiculopathy, lumbar region: Secondary | ICD-10-CM | POA: Diagnosis not present

## 2018-05-02 DIAGNOSIS — M961 Postlaminectomy syndrome, not elsewhere classified: Secondary | ICD-10-CM | POA: Diagnosis not present

## 2018-05-02 DIAGNOSIS — G894 Chronic pain syndrome: Secondary | ICD-10-CM | POA: Diagnosis not present

## 2018-05-02 DIAGNOSIS — Z79891 Long term (current) use of opiate analgesic: Secondary | ICD-10-CM | POA: Diagnosis not present

## 2018-05-02 DIAGNOSIS — J449 Chronic obstructive pulmonary disease, unspecified: Secondary | ICD-10-CM | POA: Diagnosis not present

## 2018-05-02 DIAGNOSIS — I2510 Atherosclerotic heart disease of native coronary artery without angina pectoris: Secondary | ICD-10-CM | POA: Diagnosis not present

## 2018-05-02 DIAGNOSIS — M47816 Spondylosis without myelopathy or radiculopathy, lumbar region: Secondary | ICD-10-CM | POA: Diagnosis not present

## 2018-05-26 DIAGNOSIS — R1011 Right upper quadrant pain: Secondary | ICD-10-CM | POA: Diagnosis not present

## 2018-05-26 DIAGNOSIS — K210 Gastro-esophageal reflux disease with esophagitis: Secondary | ICD-10-CM | POA: Diagnosis not present

## 2018-05-26 DIAGNOSIS — K5900 Constipation, unspecified: Secondary | ICD-10-CM | POA: Diagnosis not present

## 2018-05-27 DIAGNOSIS — R1013 Epigastric pain: Secondary | ICD-10-CM | POA: Diagnosis not present

## 2018-05-27 DIAGNOSIS — R197 Diarrhea, unspecified: Secondary | ICD-10-CM | POA: Diagnosis not present

## 2018-05-27 DIAGNOSIS — M549 Dorsalgia, unspecified: Secondary | ICD-10-CM | POA: Diagnosis not present

## 2018-05-27 DIAGNOSIS — J449 Chronic obstructive pulmonary disease, unspecified: Secondary | ICD-10-CM | POA: Diagnosis not present

## 2018-05-27 DIAGNOSIS — E1143 Type 2 diabetes mellitus with diabetic autonomic (poly)neuropathy: Secondary | ICD-10-CM | POA: Diagnosis not present

## 2018-05-27 DIAGNOSIS — G8929 Other chronic pain: Secondary | ICD-10-CM | POA: Diagnosis not present

## 2018-05-27 DIAGNOSIS — K3184 Gastroparesis: Secondary | ICD-10-CM | POA: Diagnosis not present

## 2018-05-27 DIAGNOSIS — I2510 Atherosclerotic heart disease of native coronary artery without angina pectoris: Secondary | ICD-10-CM | POA: Diagnosis not present

## 2018-05-27 DIAGNOSIS — I110 Hypertensive heart disease with heart failure: Secondary | ICD-10-CM | POA: Diagnosis not present

## 2018-05-27 DIAGNOSIS — Z8673 Personal history of transient ischemic attack (TIA), and cerebral infarction without residual deficits: Secondary | ICD-10-CM | POA: Diagnosis not present

## 2018-05-27 DIAGNOSIS — I509 Heart failure, unspecified: Secondary | ICD-10-CM | POA: Diagnosis not present

## 2018-05-27 DIAGNOSIS — R1031 Right lower quadrant pain: Secondary | ICD-10-CM | POA: Diagnosis not present

## 2018-05-30 DIAGNOSIS — R112 Nausea with vomiting, unspecified: Secondary | ICD-10-CM | POA: Diagnosis not present

## 2018-05-30 DIAGNOSIS — R1110 Vomiting, unspecified: Secondary | ICD-10-CM | POA: Diagnosis not present

## 2018-05-30 DIAGNOSIS — G894 Chronic pain syndrome: Secondary | ICD-10-CM | POA: Diagnosis not present

## 2018-05-30 DIAGNOSIS — M961 Postlaminectomy syndrome, not elsewhere classified: Secondary | ICD-10-CM | POA: Diagnosis not present

## 2018-05-30 DIAGNOSIS — Z79891 Long term (current) use of opiate analgesic: Secondary | ICD-10-CM | POA: Diagnosis not present

## 2018-05-30 DIAGNOSIS — R109 Unspecified abdominal pain: Secondary | ICD-10-CM | POA: Diagnosis not present

## 2018-05-30 DIAGNOSIS — M5416 Radiculopathy, lumbar region: Secondary | ICD-10-CM | POA: Diagnosis not present

## 2018-05-30 DIAGNOSIS — I2510 Atherosclerotic heart disease of native coronary artery without angina pectoris: Secondary | ICD-10-CM | POA: Diagnosis not present

## 2018-05-30 DIAGNOSIS — E119 Type 2 diabetes mellitus without complications: Secondary | ICD-10-CM | POA: Diagnosis not present

## 2018-05-30 DIAGNOSIS — M47816 Spondylosis without myelopathy or radiculopathy, lumbar region: Secondary | ICD-10-CM | POA: Diagnosis not present

## 2018-05-30 DIAGNOSIS — J449 Chronic obstructive pulmonary disease, unspecified: Secondary | ICD-10-CM | POA: Diagnosis not present

## 2018-07-04 DIAGNOSIS — M47816 Spondylosis without myelopathy or radiculopathy, lumbar region: Secondary | ICD-10-CM | POA: Diagnosis not present

## 2018-07-04 DIAGNOSIS — M5416 Radiculopathy, lumbar region: Secondary | ICD-10-CM | POA: Diagnosis not present

## 2018-07-04 DIAGNOSIS — G894 Chronic pain syndrome: Secondary | ICD-10-CM | POA: Diagnosis not present

## 2018-07-04 DIAGNOSIS — I2510 Atherosclerotic heart disease of native coronary artery without angina pectoris: Secondary | ICD-10-CM | POA: Diagnosis not present

## 2018-07-04 DIAGNOSIS — Z79891 Long term (current) use of opiate analgesic: Secondary | ICD-10-CM | POA: Diagnosis not present

## 2018-07-04 DIAGNOSIS — M961 Postlaminectomy syndrome, not elsewhere classified: Secondary | ICD-10-CM | POA: Diagnosis not present

## 2018-07-04 DIAGNOSIS — E119 Type 2 diabetes mellitus without complications: Secondary | ICD-10-CM | POA: Diagnosis not present

## 2018-07-04 DIAGNOSIS — J449 Chronic obstructive pulmonary disease, unspecified: Secondary | ICD-10-CM | POA: Diagnosis not present

## 2018-08-10 DIAGNOSIS — M47816 Spondylosis without myelopathy or radiculopathy, lumbar region: Secondary | ICD-10-CM | POA: Diagnosis not present

## 2018-08-10 DIAGNOSIS — E119 Type 2 diabetes mellitus without complications: Secondary | ICD-10-CM | POA: Diagnosis not present

## 2018-08-10 DIAGNOSIS — M961 Postlaminectomy syndrome, not elsewhere classified: Secondary | ICD-10-CM | POA: Diagnosis not present

## 2018-08-10 DIAGNOSIS — I2510 Atherosclerotic heart disease of native coronary artery without angina pectoris: Secondary | ICD-10-CM | POA: Diagnosis not present

## 2018-08-10 DIAGNOSIS — Z79891 Long term (current) use of opiate analgesic: Secondary | ICD-10-CM | POA: Diagnosis not present

## 2018-08-10 DIAGNOSIS — M5416 Radiculopathy, lumbar region: Secondary | ICD-10-CM | POA: Diagnosis not present

## 2018-08-10 DIAGNOSIS — J449 Chronic obstructive pulmonary disease, unspecified: Secondary | ICD-10-CM | POA: Diagnosis not present

## 2018-08-10 DIAGNOSIS — G894 Chronic pain syndrome: Secondary | ICD-10-CM | POA: Diagnosis not present

## 2018-08-11 DIAGNOSIS — H524 Presbyopia: Secondary | ICD-10-CM | POA: Diagnosis not present

## 2018-08-11 DIAGNOSIS — H3530 Unspecified macular degeneration: Secondary | ICD-10-CM | POA: Diagnosis not present

## 2018-08-11 DIAGNOSIS — H40053 Ocular hypertension, bilateral: Secondary | ICD-10-CM | POA: Diagnosis not present

## 2018-08-11 DIAGNOSIS — E113413 Type 2 diabetes mellitus with severe nonproliferative diabetic retinopathy with macular edema, bilateral: Secondary | ICD-10-CM | POA: Diagnosis not present

## 2018-08-15 DIAGNOSIS — H4313 Vitreous hemorrhage, bilateral: Secondary | ICD-10-CM | POA: Diagnosis not present

## 2018-08-15 DIAGNOSIS — E113511 Type 2 diabetes mellitus with proliferative diabetic retinopathy with macular edema, right eye: Secondary | ICD-10-CM | POA: Diagnosis not present

## 2018-08-15 DIAGNOSIS — H35361 Drusen (degenerative) of macula, right eye: Secondary | ICD-10-CM | POA: Diagnosis not present

## 2018-08-15 DIAGNOSIS — E113592 Type 2 diabetes mellitus with proliferative diabetic retinopathy without macular edema, left eye: Secondary | ICD-10-CM | POA: Diagnosis not present

## 2018-08-18 DIAGNOSIS — H4312 Vitreous hemorrhage, left eye: Secondary | ICD-10-CM | POA: Diagnosis not present

## 2018-08-18 DIAGNOSIS — E113592 Type 2 diabetes mellitus with proliferative diabetic retinopathy without macular edema, left eye: Secondary | ICD-10-CM | POA: Diagnosis not present

## 2018-08-18 DIAGNOSIS — H4052X0 Glaucoma secondary to other eye disorders, left eye, stage unspecified: Secondary | ICD-10-CM | POA: Diagnosis not present

## 2018-08-18 DIAGNOSIS — H2513 Age-related nuclear cataract, bilateral: Secondary | ICD-10-CM | POA: Diagnosis not present

## 2018-08-23 DIAGNOSIS — H25812 Combined forms of age-related cataract, left eye: Secondary | ICD-10-CM | POA: Diagnosis not present

## 2018-08-23 DIAGNOSIS — H25811 Combined forms of age-related cataract, right eye: Secondary | ICD-10-CM | POA: Diagnosis not present

## 2018-08-23 DIAGNOSIS — E113593 Type 2 diabetes mellitus with proliferative diabetic retinopathy without macular edema, bilateral: Secondary | ICD-10-CM | POA: Insufficient documentation

## 2018-08-23 DIAGNOSIS — H4312 Vitreous hemorrhage, left eye: Secondary | ICD-10-CM | POA: Diagnosis not present

## 2018-08-23 DIAGNOSIS — H40052 Ocular hypertension, left eye: Secondary | ICD-10-CM | POA: Diagnosis not present

## 2018-08-23 DIAGNOSIS — H40003 Preglaucoma, unspecified, bilateral: Secondary | ICD-10-CM | POA: Insufficient documentation

## 2018-09-11 DIAGNOSIS — R69 Illness, unspecified: Secondary | ICD-10-CM | POA: Diagnosis not present

## 2018-09-11 DIAGNOSIS — R0902 Hypoxemia: Secondary | ICD-10-CM | POA: Diagnosis not present

## 2018-09-11 DIAGNOSIS — Z7902 Long term (current) use of antithrombotics/antiplatelets: Secondary | ICD-10-CM | POA: Diagnosis not present

## 2018-09-11 DIAGNOSIS — I110 Hypertensive heart disease with heart failure: Secondary | ICD-10-CM | POA: Diagnosis not present

## 2018-09-11 DIAGNOSIS — Z794 Long term (current) use of insulin: Secondary | ICD-10-CM | POA: Diagnosis not present

## 2018-09-11 DIAGNOSIS — I509 Heart failure, unspecified: Secondary | ICD-10-CM | POA: Diagnosis not present

## 2018-09-11 DIAGNOSIS — I2510 Atherosclerotic heart disease of native coronary artery without angina pectoris: Secondary | ICD-10-CM | POA: Diagnosis not present

## 2018-09-11 DIAGNOSIS — R0789 Other chest pain: Secondary | ICD-10-CM | POA: Diagnosis not present

## 2018-09-11 DIAGNOSIS — R509 Fever, unspecified: Secondary | ICD-10-CM | POA: Diagnosis not present

## 2018-09-11 DIAGNOSIS — E119 Type 2 diabetes mellitus without complications: Secondary | ICD-10-CM | POA: Diagnosis not present

## 2018-09-11 DIAGNOSIS — J440 Chronic obstructive pulmonary disease with acute lower respiratory infection: Secondary | ICD-10-CM | POA: Diagnosis not present

## 2018-09-11 DIAGNOSIS — A419 Sepsis, unspecified organism: Secondary | ICD-10-CM | POA: Diagnosis not present

## 2018-09-12 ENCOUNTER — Inpatient Hospital Stay (HOSPITAL_COMMUNITY): Payer: Medicare HMO

## 2018-09-12 ENCOUNTER — Encounter (HOSPITAL_COMMUNITY): Payer: Self-pay | Admitting: *Deleted

## 2018-09-12 ENCOUNTER — Inpatient Hospital Stay (HOSPITAL_COMMUNITY)
Admission: AD | Admit: 2018-09-12 | Discharge: 2018-09-19 | DRG: 286 | Disposition: A | Discharge status: Home Health | Payer: Medicare HMO | Source: Other Acute Inpatient Hospital | Attending: Internal Medicine | Admitting: Internal Medicine

## 2018-09-12 ENCOUNTER — Other Ambulatory Visit: Payer: Self-pay

## 2018-09-12 DIAGNOSIS — I214 Non-ST elevation (NSTEMI) myocardial infarction: Secondary | ICD-10-CM | POA: Diagnosis not present

## 2018-09-12 DIAGNOSIS — I080 Rheumatic disorders of both mitral and aortic valves: Secondary | ICD-10-CM | POA: Diagnosis present

## 2018-09-12 DIAGNOSIS — Z79899 Other long term (current) drug therapy: Secondary | ICD-10-CM

## 2018-09-12 DIAGNOSIS — J181 Lobar pneumonia, unspecified organism: Secondary | ICD-10-CM | POA: Diagnosis not present

## 2018-09-12 DIAGNOSIS — J810 Acute pulmonary edema: Secondary | ICD-10-CM | POA: Diagnosis not present

## 2018-09-12 DIAGNOSIS — Z951 Presence of aortocoronary bypass graft: Secondary | ICD-10-CM

## 2018-09-12 DIAGNOSIS — I259 Chronic ischemic heart disease, unspecified: Secondary | ICD-10-CM | POA: Diagnosis not present

## 2018-09-12 DIAGNOSIS — E1159 Type 2 diabetes mellitus with other circulatory complications: Secondary | ICD-10-CM | POA: Diagnosis present

## 2018-09-12 DIAGNOSIS — Z20828 Contact with and (suspected) exposure to other viral communicable diseases: Secondary | ICD-10-CM | POA: Diagnosis not present

## 2018-09-12 DIAGNOSIS — K219 Gastro-esophageal reflux disease without esophagitis: Secondary | ICD-10-CM | POA: Diagnosis present

## 2018-09-12 DIAGNOSIS — I409 Acute myocarditis, unspecified: Principal | ICD-10-CM | POA: Diagnosis present

## 2018-09-12 DIAGNOSIS — I248 Other forms of acute ischemic heart disease: Secondary | ICD-10-CM | POA: Diagnosis not present

## 2018-09-12 DIAGNOSIS — I480 Paroxysmal atrial fibrillation: Secondary | ICD-10-CM | POA: Diagnosis present

## 2018-09-12 DIAGNOSIS — I420 Dilated cardiomyopathy: Secondary | ICD-10-CM | POA: Diagnosis present

## 2018-09-12 DIAGNOSIS — J189 Pneumonia, unspecified organism: Secondary | ICD-10-CM | POA: Diagnosis not present

## 2018-09-12 DIAGNOSIS — F329 Major depressive disorder, single episode, unspecified: Secondary | ICD-10-CM | POA: Diagnosis present

## 2018-09-12 DIAGNOSIS — E1165 Type 2 diabetes mellitus with hyperglycemia: Secondary | ICD-10-CM | POA: Diagnosis not present

## 2018-09-12 DIAGNOSIS — Z955 Presence of coronary angioplasty implant and graft: Secondary | ICD-10-CM

## 2018-09-12 DIAGNOSIS — I2489 Other forms of acute ischemic heart disease: Secondary | ICD-10-CM | POA: Diagnosis present

## 2018-09-12 DIAGNOSIS — E118 Type 2 diabetes mellitus with unspecified complications: Secondary | ICD-10-CM | POA: Diagnosis not present

## 2018-09-12 DIAGNOSIS — Z720 Tobacco use: Secondary | ICD-10-CM | POA: Diagnosis present

## 2018-09-12 DIAGNOSIS — I351 Nonrheumatic aortic (valve) insufficiency: Secondary | ICD-10-CM

## 2018-09-12 DIAGNOSIS — E669 Obesity, unspecified: Secondary | ICD-10-CM | POA: Diagnosis present

## 2018-09-12 DIAGNOSIS — J9601 Acute respiratory failure with hypoxia: Secondary | ICD-10-CM | POA: Diagnosis not present

## 2018-09-12 DIAGNOSIS — J9690 Respiratory failure, unspecified, unspecified whether with hypoxia or hypercapnia: Secondary | ICD-10-CM

## 2018-09-12 DIAGNOSIS — R072 Precordial pain: Secondary | ICD-10-CM | POA: Diagnosis not present

## 2018-09-12 DIAGNOSIS — R05 Cough: Secondary | ICD-10-CM | POA: Diagnosis not present

## 2018-09-12 DIAGNOSIS — I471 Supraventricular tachycardia: Secondary | ICD-10-CM | POA: Diagnosis not present

## 2018-09-12 DIAGNOSIS — I509 Heart failure, unspecified: Secondary | ICD-10-CM | POA: Diagnosis not present

## 2018-09-12 DIAGNOSIS — D72821 Monocytosis (symptomatic): Secondary | ICD-10-CM | POA: Diagnosis not present

## 2018-09-12 DIAGNOSIS — F419 Anxiety disorder, unspecified: Secondary | ICD-10-CM | POA: Diagnosis present

## 2018-09-12 DIAGNOSIS — J440 Chronic obstructive pulmonary disease with acute lower respiratory infection: Secondary | ICD-10-CM | POA: Diagnosis present

## 2018-09-12 DIAGNOSIS — I361 Nonrheumatic tricuspid (valve) insufficiency: Secondary | ICD-10-CM

## 2018-09-12 DIAGNOSIS — I5043 Acute on chronic combined systolic (congestive) and diastolic (congestive) heart failure: Secondary | ICD-10-CM | POA: Diagnosis not present

## 2018-09-12 DIAGNOSIS — Z79891 Long term (current) use of opiate analgesic: Secondary | ICD-10-CM

## 2018-09-12 DIAGNOSIS — R0602 Shortness of breath: Secondary | ICD-10-CM | POA: Diagnosis not present

## 2018-09-12 DIAGNOSIS — A419 Sepsis, unspecified organism: Secondary | ICD-10-CM | POA: Diagnosis not present

## 2018-09-12 DIAGNOSIS — I213 ST elevation (STEMI) myocardial infarction of unspecified site: Secondary | ICD-10-CM | POA: Diagnosis not present

## 2018-09-12 DIAGNOSIS — Z89611 Acquired absence of right leg above knee: Secondary | ICD-10-CM

## 2018-09-12 DIAGNOSIS — I5041 Acute combined systolic (congestive) and diastolic (congestive) heart failure: Secondary | ICD-10-CM | POA: Diagnosis present

## 2018-09-12 DIAGNOSIS — R079 Chest pain, unspecified: Secondary | ICD-10-CM

## 2018-09-12 DIAGNOSIS — Z6835 Body mass index (BMI) 35.0-35.9, adult: Secondary | ICD-10-CM

## 2018-09-12 DIAGNOSIS — Z794 Long term (current) use of insulin: Secondary | ICD-10-CM

## 2018-09-12 DIAGNOSIS — F17210 Nicotine dependence, cigarettes, uncomplicated: Secondary | ICD-10-CM | POA: Diagnosis present

## 2018-09-12 DIAGNOSIS — Z716 Tobacco abuse counseling: Secondary | ICD-10-CM

## 2018-09-12 DIAGNOSIS — Z8349 Family history of other endocrine, nutritional and metabolic diseases: Secondary | ICD-10-CM

## 2018-09-12 DIAGNOSIS — E785 Hyperlipidemia, unspecified: Secondary | ICD-10-CM | POA: Diagnosis present

## 2018-09-12 DIAGNOSIS — Z89432 Acquired absence of left foot: Secondary | ICD-10-CM

## 2018-09-12 DIAGNOSIS — I252 Old myocardial infarction: Secondary | ICD-10-CM | POA: Diagnosis not present

## 2018-09-12 DIAGNOSIS — I2510 Atherosclerotic heart disease of native coronary artery without angina pectoris: Secondary | ICD-10-CM | POA: Diagnosis present

## 2018-09-12 DIAGNOSIS — D72829 Elevated white blood cell count, unspecified: Secondary | ICD-10-CM | POA: Diagnosis present

## 2018-09-12 DIAGNOSIS — I5023 Acute on chronic systolic (congestive) heart failure: Secondary | ICD-10-CM | POA: Diagnosis not present

## 2018-09-12 DIAGNOSIS — Z885 Allergy status to narcotic agent status: Secondary | ICD-10-CM

## 2018-09-12 DIAGNOSIS — Z882 Allergy status to sulfonamides status: Secondary | ICD-10-CM

## 2018-09-12 DIAGNOSIS — R7989 Other specified abnormal findings of blood chemistry: Secondary | ICD-10-CM | POA: Diagnosis present

## 2018-09-12 DIAGNOSIS — J90 Pleural effusion, not elsewhere classified: Secondary | ICD-10-CM | POA: Diagnosis not present

## 2018-09-12 DIAGNOSIS — R0789 Other chest pain: Secondary | ICD-10-CM | POA: Diagnosis not present

## 2018-09-12 DIAGNOSIS — I428 Other cardiomyopathies: Secondary | ICD-10-CM | POA: Diagnosis not present

## 2018-09-12 DIAGNOSIS — I25119 Atherosclerotic heart disease of native coronary artery with unspecified angina pectoris: Secondary | ICD-10-CM | POA: Diagnosis not present

## 2018-09-12 DIAGNOSIS — Z8249 Family history of ischemic heart disease and other diseases of the circulatory system: Secondary | ICD-10-CM

## 2018-09-12 DIAGNOSIS — Z7902 Long term (current) use of antithrombotics/antiplatelets: Secondary | ICD-10-CM

## 2018-09-12 DIAGNOSIS — I255 Ischemic cardiomyopathy: Secondary | ICD-10-CM | POA: Diagnosis present

## 2018-09-12 HISTORY — DX: Chronic diastolic (congestive) heart failure: I50.32

## 2018-09-12 HISTORY — DX: Paroxysmal atrial fibrillation: I48.0

## 2018-09-12 HISTORY — DX: Atherosclerotic heart disease of native coronary artery without angina pectoris: I25.10

## 2018-09-12 LAB — ECHOCARDIOGRAM COMPLETE
HEIGHTINCHES: 62.5 in
WEIGHTICAEL: 3241.64 [oz_av]

## 2018-09-12 LAB — GLUCOSE, CAPILLARY
GLUCOSE-CAPILLARY: 143 mg/dL — AB (ref 70–99)
Glucose-Capillary: 325 mg/dL — ABNORMAL HIGH (ref 70–99)
Glucose-Capillary: 220 mg/dL — ABNORMAL HIGH (ref 70–99)
Glucose-Capillary: 72 mg/dL (ref 70–99)
Glucose-Capillary: 83 mg/dL (ref 70–99)

## 2018-09-12 LAB — TROPONIN I
Troponin I: 14.91 ng/mL (ref <0.03)
Troponin I: 12.97 ng/mL (ref <0.03)

## 2018-09-12 LAB — MRSA PCR SCREENING: MRSA by PCR: NEGATIVE

## 2018-09-12 LAB — HEPARIN LEVEL (UNFRACTIONATED): Heparin Unfractionated: <0.1 IU/mL — ABNORMAL LOW (ref 0.30–0.70)

## 2018-09-12 MED ORDER — HEPARIN BOLUS VIA INFUSION
2500.0000 [IU] | Freq: Once | INTRAVENOUS | Status: AC
  Administered 2018-09-12: 2500 [IU] via INTRAVENOUS
  Filled 2018-09-12: qty 2500

## 2018-09-12 MED ORDER — SODIUM CHLORIDE 0.9% FLUSH
3.0000 mL | Freq: Two times a day (BID) | INTRAVENOUS | Status: DC
  Administered 2018-09-12 – 2018-09-18 (×12): 3 mL via INTRAVENOUS

## 2018-09-12 MED ORDER — CLOPIDOGREL BISULFATE 75 MG PO TABS
75.0000 mg | ORAL_TABLET | Freq: Every day | ORAL | Status: DC
  Filled 2018-09-12: qty 1

## 2018-09-12 MED ORDER — ATORVASTATIN CALCIUM 80 MG PO TABS
80.0000 mg | ORAL_TABLET | Freq: Every day | ORAL | Status: DC
  Administered 2018-09-12 – 2018-09-18 (×7): 80 mg via ORAL
  Filled 2018-09-12 (×7): qty 1

## 2018-09-12 MED ORDER — CHLORHEXIDINE GLUCONATE CLOTH 2 % EX PADS
6.0000 | MEDICATED_PAD | Freq: Every day | CUTANEOUS | Status: DC
  Administered 2018-09-12 – 2018-09-15 (×4): 6 via TOPICAL

## 2018-09-12 MED ORDER — MORPHINE SULFATE (PF) 2 MG/ML IV SOLN
2.0000 mg | INTRAVENOUS | Status: DC | PRN
  Administered 2018-09-12 – 2018-09-17 (×42): 2 mg via INTRAVENOUS
  Filled 2018-09-12 (×44): qty 1

## 2018-09-12 MED ORDER — ONDANSETRON HCL 4 MG/2ML IJ SOLN
INTRAMUSCULAR | Status: AC
  Filled 2018-09-12: qty 2

## 2018-09-12 MED ORDER — ACETAMINOPHEN 325 MG PO TABS: 650.0000 mg | ORAL_TABLET | Freq: Four times a day (QID) | ORAL | Status: DC | PRN

## 2018-09-12 MED ORDER — ASPIRIN EC 81 MG PO TBEC
81.0000 mg | DELAYED_RELEASE_TABLET | Freq: Every day | ORAL | Status: DC
  Administered 2018-09-13 – 2018-09-19 (×7): 81 mg via ORAL
  Filled 2018-09-12 (×7): qty 1

## 2018-09-12 MED ORDER — ASPIRIN 325 MG PO TABS
325.0000 mg | ORAL_TABLET | Freq: Every day | ORAL | Status: AC
  Administered 2018-09-12: 325 mg via ORAL
  Filled 2018-09-12: qty 1

## 2018-09-12 MED ORDER — PANTOPRAZOLE SODIUM 40 MG IV SOLR
40.0000 mg | INTRAVENOUS | Status: DC
  Administered 2018-09-12 – 2018-09-13 (×2): 40 mg via INTRAVENOUS
  Filled 2018-09-12 (×2): qty 40

## 2018-09-12 MED ORDER — INSULIN ASPART 100 UNIT/ML ~~LOC~~ SOLN
0.0000 [IU] | SUBCUTANEOUS | Status: DC
  Administered 2018-09-12: 15 [IU] via SUBCUTANEOUS
  Administered 2018-09-12: 3 [IU] via SUBCUTANEOUS
  Administered 2018-09-12 – 2018-09-13 (×2): 7 [IU] via SUBCUTANEOUS
  Administered 2018-09-13: 0 [IU] via SUBCUTANEOUS
  Administered 2018-09-13: 4 [IU] via SUBCUTANEOUS
  Administered 2018-09-13 (×3): 3 [IU] via SUBCUTANEOUS
  Administered 2018-09-14: 4 [IU] via SUBCUTANEOUS
  Administered 2018-09-14 (×3): 3 [IU] via SUBCUTANEOUS
  Administered 2018-09-14: 4 [IU] via SUBCUTANEOUS
  Administered 2018-09-15: 09:00:00 7 [IU] via SUBCUTANEOUS
  Administered 2018-09-15: 17:00:00 3 [IU] via SUBCUTANEOUS
  Administered 2018-09-15: 4 [IU] via SUBCUTANEOUS
  Administered 2018-09-15: 7 [IU] via SUBCUTANEOUS
  Administered 2018-09-16 (×2): 4 [IU] via SUBCUTANEOUS
  Administered 2018-09-16: 3 [IU] via SUBCUTANEOUS
  Administered 2018-09-16: 7 [IU] via SUBCUTANEOUS
  Administered 2018-09-16: 18:00:00 20 [IU] via SUBCUTANEOUS
  Administered 2018-09-17: 4 [IU] via SUBCUTANEOUS
  Administered 2018-09-17: 15 [IU] via SUBCUTANEOUS
  Administered 2018-09-17 (×3): 7 [IU] via SUBCUTANEOUS
  Administered 2018-09-17: 22:00:00 4 [IU] via SUBCUTANEOUS
  Administered 2018-09-18: 05:00:00 11 [IU] via SUBCUTANEOUS
  Administered 2018-09-18: 4 [IU] via SUBCUTANEOUS
  Administered 2018-09-18: 3 [IU] via SUBCUTANEOUS
  Administered 2018-09-18: 15 [IU] via SUBCUTANEOUS
  Administered 2018-09-18: 3 [IU] via SUBCUTANEOUS
  Administered 2018-09-19: 4 [IU] via SUBCUTANEOUS

## 2018-09-12 MED ORDER — SODIUM CHLORIDE 0.9 % IV SOLN
500.0000 mg | INTRAVENOUS | Status: DC
  Administered 2018-09-12 – 2018-09-14 (×3): 500 mg via INTRAVENOUS
  Filled 2018-09-12 (×4): qty 500

## 2018-09-12 MED ORDER — SODIUM CHLORIDE 0.9 % IV SOLN
1.0000 g | INTRAVENOUS | Status: AC
  Administered 2018-09-12 – 2018-09-18 (×7): 1 g via INTRAVENOUS
  Filled 2018-09-12 (×7): qty 10

## 2018-09-12 MED ORDER — INSULIN GLARGINE 100 UNIT/ML ~~LOC~~ SOLN
35.0000 [IU] | Freq: Every day | SUBCUTANEOUS | Status: DC
  Administered 2018-09-14 – 2018-09-17 (×3): 35 [IU] via SUBCUTANEOUS
  Filled 2018-09-12 (×8): qty 0.35

## 2018-09-12 MED ORDER — NITROGLYCERIN 0.4 MG SL SUBL
SUBLINGUAL_TABLET | SUBLINGUAL | Status: AC
  Administered 2018-09-12: 0.4 mg via SUBLINGUAL
  Administered 2018-09-12: 07:00:00 via SUBLINGUAL
  Administered 2018-09-12: 07:00:00
  Filled 2018-09-12: qty 3

## 2018-09-12 MED ORDER — CLOPIDOGREL BISULFATE 75 MG PO TABS: 75.0000 mg | ORAL_TABLET | Freq: Every day | ORAL | Status: DC

## 2018-09-12 MED ORDER — HEPARIN (PORCINE) 25000 UT/250ML-% IV SOLN
1250.0000 [IU]/h | INTRAVENOUS | Status: DC
  Administered 2018-09-12: 900 [IU]/h via INTRAVENOUS
  Filled 2018-09-12 (×2): qty 250

## 2018-09-12 MED ORDER — ONDANSETRON HCL 4 MG/2ML IJ SOLN
4.0000 mg | Freq: Four times a day (QID) | INTRAMUSCULAR | Status: DC | PRN
  Administered 2018-09-12 – 2018-09-13 (×2): 4 mg via INTRAVENOUS
  Filled 2018-09-12: qty 2

## 2018-09-12 MED ORDER — SODIUM CHLORIDE 0.9 % IV SOLN
INTRAVENOUS | Status: DC | PRN
  Administered 2018-09-12: 500 mL via INTRAVENOUS

## 2018-09-12 MED ORDER — ATORVASTATIN CALCIUM 10 MG PO TABS: 20.0000 mg | ORAL_TABLET | ORAL | Status: DC

## 2018-09-12 MED ORDER — METOPROLOL TARTRATE 5 MG/5ML IV SOLN
5.0000 mg | Freq: Four times a day (QID) | INTRAVENOUS | Status: DC
  Administered 2018-09-12 – 2018-09-13 (×4): 5 mg via INTRAVENOUS
  Filled 2018-09-12 (×4): qty 5

## 2018-09-12 MED ORDER — ASPIRIN EC 81 MG PO TBEC: 81.0000 mg | DELAYED_RELEASE_TABLET | Freq: Every day | ORAL | Status: DC

## 2018-09-12 NOTE — Progress Notes (Signed)
CBG 72 . Patient is alert and asymptomatic . Ginger Ale given and will recheck CBG .Lantus 35 units due at 1600 but I held it per Tessie Fass NP

## 2018-09-12 NOTE — Progress Notes (Signed)
Case reviewed with Dr. Herbie Baltimore with cardiology.  Agrees with plan of heparin gtt, ASA, lipitor, lopressor.  Diurese as able.  No indication for cardiac interventions at this time.  Will defer formal cardiac assessment until COVID 19 testing results are available, or if clinical status gets worse.  Coralyn Helling, MD [facility] Pulmonary/Critical Care 09/12/2018, 9:05 AM

## 2018-09-12 NOTE — Progress Notes (Signed)
  Echocardiogram 2D Echocardiogram has been performed.  Janalyn Harder 09/12/2018, 11:27 AM

## 2018-09-12 NOTE — Progress Notes (Signed)
Patient vomited green emesis approximately 50 mls . Dr Craige Cotta called and informed . Zofran ordered and given

## 2018-09-12 NOTE — Progress Notes (Signed)
CRITICAL VALUE ALERT  Critical Value:  Troponin  Date & Time Notified:  3/30 @ 1309  Provider Notified: MD Sood  Orders Received/Actions taken: No further orders at this time.

## 2018-09-12 NOTE — Progress Notes (Signed)
eLink Physician-Brief Progress Note Patient Name: Mindy Love DOB: [birth year] MRN: [id number]   Date of Service  09/12/2018  HPI/Events of Note  63 y/o F HF, COPD presented to [facility] ED with shortness of breath and was febrile at 102 in the ED. CXR with interstitial changes and COVID being ruled out.  She did have a BNP >6,000, procalcitonin normal, WBC 16.8.  RSV and influenza negative.  Her troponin did increase to 9 and hence transfer to [facility] for possible cardiology consultation.  eICU Interventions   NSTEMI, ACS medications, nitrates, antiplatelets  COVID less likely but prudent to rule out given symptoms of SOB, fever and CXR findings     Intervention Category Major Interventions: Respiratory failure - evaluation and management Evaluation Type: New Patient Evaluation  [name] 09/12/2018, 6:48 AM

## 2018-09-12 NOTE — H&P (Addendum)
NAME:  Micheal LikensDolly C Grasse MRN:  [id number] DOB:  December 31, 1955 LOS: 0 ADMISSION DATE:  09/12/2018 DATE OF SERVICE:  09/12/2018  CHIEF COMPLAINT: Acute hypoxemic respiratory failure  HISTORY & PHYSICAL  History of Present Illness  This 63 y.o. obese Caucasian female smoker transferred to the [facility] Emergency Department from [facility] for higher level of care.  She presented to [facility] with complaints of fever and shortness of breath.  She was deemed to need COVID-19 ruled out.  She was originally slated to be [facility]; however, in the emergency department at [facility], her troponin went from 0.2 to 9+, prompting potential need for cardiology evaluation.  Consequently, she was transported to [facility].  Currently, she reports chest pain 8/10, refractory to sublingual NTG x 3 doses. Tightness in the back. Pleuritic pain under the R breast and paroxysmal coughing.  In the emergency department at [facility], she was observed to have SpO2 in the 70s.  Chest x-ray was obtained (report provided, images not included) with report of findings of pulmonary edema.  No fever at present but apparently had a fever of 102 (per nursing notes).  No known sick contacts.    Initial laboratory evaluation at [facility]:  Sodium 132.  Potassium 4.5.  Chloride 100.  Bicarbonate 24.  BUN 20, creatinine 0.8.  Glucose 443.  Protein 6.2.  Albumin 3.1.  AST 21.  ALT 14.  T bili 0.5. Troponin-I 0.26-->9.13.  NT proBNP 6070.  LDH 562. Lactate 2.5.-->1.6. Procalcitonin < 0.05. WBC 16.8 (84.8% neutrophils, 9.8% lymphocytes, 3.8% monocytes).  Hemoglobin 15.4, hematocrit 47.3.  Platelets 302. PT 10.6.  INR 1.0.  APTT 25.9. ABG: pH 7.42, PCO2 40, PO2 160 on FiO2 100%. Urinalysis: Yellow urine, clear, specific gravity 1.005, pH 6, 2+ protein, 3+ glucose, negative ketones, 2+ occult blood.  2-5 WBC per high-power field, 2+ bacteria, negative  leukocyte esterase, negative nitrite. COVID-19 pending. Flu A negative.  Flu B negative. RSV negative. 12-lead EKG (09/11/2018 2345): Sinus tachycardia.  Normal axis.  T wave flattening in I, aVL, V5, V6.  ST depression in II, III, aVF.  ST elevation in V2 only.  Her home medications include: Protonix 40 mg p.o. twice daily Oxycodone 5 mg p.o. 4 times daily as needed Metoprolol succinate 50 mg p.o. daily Reglan 10 mg p.o before meals and at bedtime Lisinopril 2.5 mg p.o. daily Insulin degludec 40 mg every 40 units subcutaneously every morning NovoLog sliding scale Gabapentin 300 mg p.o. 3 times daily Cymbalta 60 mg p.o. twice daily Bentyl 20 mg p.o. every 6 hours as needed Diclofenac 50 mg p.o. 3 times daily as needed Plavix 75 mg p.o. daily Lipitor 20 mg p.o. nightly Amlodipine Norvasc 5 mg p.o. daily Maalox +30 mL p.o. every 3 hours as needed Proventil HFA 2 puffs inhaled every 4 hours as needed.  REVIEW OF SYSTEMS  Constitutional: Night sweats. No weight loss. No fever. No chills. No fatigue. HEENT: No headaches, dysphagia, sore throat, otalgia, nasal congestion, PND CV:  Chest pain (8/10, refractory to NTG). Lower extremity edema. No orthopnea, PND, palpitations GI:  No abdominal pain, nausea, vomiting, diarrhea, change in bowel pattern, anorexia Resp: No DOE, rest dyspnea, cough, mucus, hemoptysis, wheezing  GU: no dysuria, change in color of urine, no urgency or frequency.  No flank pain. MS:  R AKA. No joint pain or swelling. No myalgias,  No decreased range of motion.  Psych:  No change  in mood or affect. No memory loss. Skin: no rash or lesions.   Past Medical/Surgical/Social/Family History   Past Medical History:  Diagnosis Date  . Anxiety   . Coronary artery disease   . Depression   . Diabetes mellitus   . Frequency   . GERD (gastroesophageal reflux disease)   . Herniated disc   . Hyperlipidemia   . Myocardial infarction [facility]) 10/12   OV Dr Tomie China  12/22/11  with clearance note on chart , EKG  11/12 on chart    Past Surgical History:  Procedure Laterality Date  .  NASAL SURG    . APPENDECTOMY    . BACK SURGERY    . CARDIAC CATHETERIZATION  10/12  . CARDIAC STENTS    . CORONARY ARTERY BYPASS GRAFT  X 4 VESSELS 2012  . EYE SURGERY     bilateral lasik  . LUMBAR LAMINECTOMY/DECOMPRESSION MICRODISCECTOMY  06/25/2011   Procedure: LUMBAR LAMINECTOMY/DECOMPRESSION MICRODISCECTOMY;  Surgeon: Jacki Cones;  Location: [facility];  Service: Orthopedics;  Laterality: Right;  Lumbar Hemi Laminectomy L5 - S1 on the Right/Microdiscectomy on the Right  (X-Ray  . LUMBAR LAMINECTOMY/DECOMPRESSION MICRODISCECTOMY  12/23/2011   Procedure: LUMBAR LAMINECTOMY/DECOMPRESSION MICRODISCECTOMY;  Surgeon: Jacki Cones, MD;  Location: [facility];  Service: Orthopedics;  Laterality: Right;  Hemi Laminectomy, Microdiscectomy L5-S1 on right  . OOPHORECTOMY    . right above knee amputation    . ROTATOR CUFF REPAIR  right    Social History   Tobacco Use  . Smoking status: Current Every Day Smoker    Packs/day: 1.00    Types: Cigarettes    Last attempt to quit: 03/23/2009    Years since quitting: 9.4  . Smokeless tobacco: Never Used  Substance Use Topics  . Alcohol use: No    No family history on file.   Procedures:  N/A   Significant Diagnostic Tests:  N/A   Micro Data:   Results for orders placed or performed during the hospital encounter of 12/23/11  Wound culture     Status: None   Collection Time: 12/23/11  9:54 AM  Result Value Ref Range Status   Specimen Description WOUND LUMBAR 5 SACRAL 1 DISC SPACE  Final   Special Requests PATIENT ON FOLLOWING ANCEF 2GRAMS  Final   Gram Stain   Final    RARE WBC PRESENT,BOTH PMN AND MONONUCLEAR NO SQUAMOUS EPITHELIAL CELLS SEEN NO ORGANISMS SEEN   Culture NO GROWTH 2 DAYS  Final   Report Status 12/25/2011 FINAL  Final  Anaerobic culture     Status: None   Collection Time: 12/23/11  9:54 AM  Result Value  Ref Range Status   Specimen Description WOUND LUMBAR 5 SACRAL 1DISC SPACE  Final   Special Requests PATIENT ON FOLLOWING ANCEF 2GRAMS  Final   Gram Stain   Final    RARE WBC PRESENT,BOTH PMN AND MONONUCLEAR NO SQUAMOUS EPITHELIAL CELLS SEEN NO ORGANISMS SEEN   Culture NO ANAEROBES ISOLATED  Final   Report Status 12/28/2011 FINAL  Final      Antimicrobials:  N/A   Interim history/subjective:  N/A   Objective   BP 138/62   Pulse 73   Temp 98.6 F (37 C) (Oral)   Resp (!) 23   Ht 5' 2.5" (1.588 m)   Wt 91.9 kg   SpO2 95%   BMI 36.47 kg/m     Filed Weights   09/12/18 0540  Weight: 91.9 kg    Intake/Output Summary (Last 24 hours)  at 09/12/2018 0321 Last data filed at 09/12/2018 0600 Gross per 24 hour  Intake -  Output 50 ml  Net -50 ml    Examination: GENERAL: alert, oriented to time, person and place, pleasant. No acute distress. HEAD: normocephalic, atraumatic EYE: PERRLA, EOM intact, no scleral icterus, no pallor. NOSE: nares are patent. No polyps. No exudate. No sinus tenderness. THROAT/ORAL CAVITY: Normal dentition. No oral thrush. No exudate. Mucous membranes are moist. No tonsillar enlargement. Mallampati class IV (only hard palate visible) airway. NECK: supple, no thyromegaly, no JVD, no lymphadenopathy. Trachea midline. CHEST/LUNG: symmetric in development and expansion.  Diminished air entry, likely owing to body habitus.  Diffuse bilateral crackles.  No wheezes. HEART: Regular S1 and S2 without murmur, rub or gallop. ABDOMEN: soft, nontender, nondistended. Normoactive bowel sounds. No rebound. No guarding. No hepatosplenomegaly. EXTREMITIES: Edema: 2+. No cyanosis.  No clubbing. 2+ DP pulses.  Status post right AKA. LYMPHATIC: no cervical/axillary/inguinal lymph nodes appreciated MUSCULOSKELETAL: No point tenderness.  No bulk atrophy. Joints: No overt deformities.  SKIN: No rash or lesion.  NEUROLOGIC: Doll's eyes intact. Corneal reflex intact.  Spontaneous respirations intact. Cranial nerves II-XII are grossly symmetric and physiologic. Babinski absent. No sensory deficit. Motor: 5/5 @ RUE, 5/5 @ LUE, 5/5 @ RLL,  5/5 @ LLL.  DTR: 2+ @ R biceps, 2+ @ L biceps, 2+ @ L patellar. No cerebellar signs. Gait was not assessed.   Resolved Hospital Problem list   N/A   Assessment & Plan:   ASSESSMENT/PLAN:  ASSESSMENT (included in the Hospital Problem List)  Active Problems:   Acute hypoxemic respiratory failure (HCC)   By systems: PULMONARY  SARS-CoV-2 test pending  Acute hypoxemic respiratory failure  Acute pulmonary edema  Tobacco abuse CTA chest to rule out pulmonary embolism. Empiric antibiotics: Rocephin/azithromycin. Smoking cessation counseling provided.   CARDIOVASCULAR  ST elevation myocardial infarction  Acute decompensated heart failure, suspicious for post MI pump failure  Coronary artery disease status post CABG Consult cardiology. In light of COVID-19 test pending, will we will attempt medical management of her MI.  12-lead EKG is a certainly concerning for lateral ischemia. Aspirin, Effient, heparin drip, beta-blockade.  Continue statin.  Will hold off on ACE inhibitor/ARB at this time. 2D echo.   RENAL  No acute issues   GASTROINTESTINAL  GI PROPHYLAXIS: Protonix   HEMATOLOGIC  Leukocytosis with mild monocytosis  DVT PROPHYLAXIS: Heparin   INFECTIOUS  SARS-CoV-2/COVID-19 test pending   ENDOCRINE  Type 2 diabetes mellitus with vascular complications Accu-Cheks and sliding scale insulin.   NEUROLOGIC  No acute issues   PLAN/RECOMMENDATIONS   Admit to ICU under my service (Attending: Marcelle Smiling, MD) with the diagnoses highlighted above in the active Hospital Problem List (ASSESSMENT).  NUTRITION: NPO except meds    My assessment, plan of care, findings, medications, side effects, etc. were discussed with: nurse.   Best practice:  Diet: NPO Pain/Anxiety/Delirium  protocol (if indicated): Morphine VAP protocol (if indicated): Not indicated DVT prophylaxis: Heparin infusion GI prophylaxis: Protonix Glucose control: Sliding scale insulin Mobility/Activity: Bedrest   Code Status: Full Code Family Communication:  No family at the bedside Spouse: Doralee Flot [phone] Disposition:    Labs   CBC: No results for input(s): WBC, NEUTROABS, HGB, HCT, MCV, PLT in the last 168 hours.  Basic Metabolic Panel: No results for input(s): NA, K, CL, CO2, GLUCOSE, BUN, CREATININE, CALCIUM, MG, PHOS in the last 168 hours. GFR: CrCl cannot be calculated (Patient's most recent lab result is older than  the maximum 21 days allowed.). No results for input(s): PROCALCITON, WBC, LATICACIDVEN in the last 168 hours.  Liver Function Tests: No results for input(s): AST, ALT, ALKPHOS, BILITOT, PROT, ALBUMIN in the last 168 hours. No results for input(s): LIPASE, AMYLASE in the last 168 hours. No results for input(s): AMMONIA in the last 168 hours.  ABG No results found for: PHART, PCO2ART, PO2ART, HCO3, TCO2, ACIDBASEDEF, O2SAT   Coagulation Profile: No results for input(s): INR, PROTIME in the last 168 hours.  Cardiac Enzymes: No results for input(s): CKTOTAL, CKMB, CKMBINDEX, TROPONINI in the last 168 hours.  HbA1C: No results found for: HGBA1C  CBG: No results for input(s): GLUCAP in the last 168 hours.   Past Medical History   Past Medical History:  Diagnosis Date  . Anxiety   . Coronary artery disease   . Depression   . Diabetes mellitus   . Frequency   . GERD (gastroesophageal reflux disease)   . Herniated disc   . Hyperlipidemia   . Myocardial infarction [facility]) 10/12   OV Dr Tomie China  12/22/11 with clearance note on chart , EKG  11/12 on chart      Surgical History    Past Surgical History:  Procedure Laterality Date  .  NASAL SURG    . APPENDECTOMY    . BACK SURGERY    . CARDIAC CATHETERIZATION  10/12  . CARDIAC STENTS    . CORONARY  ARTERY BYPASS GRAFT  X 4 VESSELS 2012  . EYE SURGERY     bilateral lasik  . LUMBAR LAMINECTOMY/DECOMPRESSION MICRODISCECTOMY  06/25/2011   Procedure: LUMBAR LAMINECTOMY/DECOMPRESSION MICRODISCECTOMY;  Surgeon: Jacki Cones;  Location: [facility];  Service: Orthopedics;  Laterality: Right;  Lumbar Hemi Laminectomy L5 - S1 on the Right/Microdiscectomy on the Right  (X-Ray  . LUMBAR LAMINECTOMY/DECOMPRESSION MICRODISCECTOMY  12/23/2011   Procedure: LUMBAR LAMINECTOMY/DECOMPRESSION MICRODISCECTOMY;  Surgeon: Jacki Cones, MD;  Location: [facility];  Service: Orthopedics;  Laterality: Right;  Hemi Laminectomy, Microdiscectomy L5-S1 on right  . OOPHORECTOMY    . right above knee amputation    . ROTATOR CUFF REPAIR  right      Social History   Social History   Socioeconomic History  . Marital status: Married    Spouse name: Not on file  . Number of children: Not on file  . Years of education: Not on file  . Highest education level: Not on file  Occupational History  . Not on file  Social Needs  . Financial resource strain: Not on file  . Food insecurity:    Worry: Not on file    Inability: Not on file  . Transportation needs:    Medical: Not on file    Non-medical: Not on file  Tobacco Use  . Smoking status: Current Every Day Smoker    Packs/day: 1.00    Types: Cigarettes    Last attempt to quit: 03/23/2009    Years since quitting: 9.4  . Smokeless tobacco: Never Used  Substance and Sexual Activity  . Alcohol use: No  . Drug use: No  . Sexual activity: Not on file  Lifestyle  . Physical activity:    Days per week: Not on file    Minutes per session: Not on file  . Stress: Not on file  Relationships  . Social connections:    Talks on phone: Not on file    Gets together: Not on file    Attends religious service: Not on file  Active member of club or organization: Not on file    Attends meetings of clubs or organizations: Not on file    Relationship status: Not on file   Other Topics Concern  . Not on file  Social History Narrative  . Not on file      Family History   No family history on file. family history is not on file.    Allergies Allergies  Allergen Reactions  . Codeine Hives  . Sulfa Antibiotics Swelling  . Tramadol Hives      Current Medications  Current Facility-Administered Medications:  .  0.9 %  sodium chloride infusion, , Intravenous, PRN, Oretha Milch, MD   Home Medications  Prior to Admission medications   Medication Sig Start Date End Date Taking? Authorizing Provider  ALPRAZolam Prudy Feeler) 1 MG tablet Take 1 mg by mouth every 12 (twelve) hours as needed. Anxiety    [provider]  aspirin EC 81 MG tablet Take 81 mg by mouth daily after breakfast. States LD 12/16/11    [provider]  atorvastatin (LIPITOR) 20 MG tablet Take 20 mg by mouth daily.    [provider]  atorvastatin (LIPITOR) 80 MG tablet Take 80 mg by mouth at bedtime.     [provider]  buPROPion (WELLBUTRIN) 75 MG tablet Take 75 mg by mouth 2 (two) times daily.    [provider]  calcium-vitamin D (OSCAL WITH D) 500-200 MG-UNIT per tablet Take 1 tablet by mouth daily.    [provider]  clonazePAM (KLONOPIN) 0.5 MG tablet Take 0.5 mg by mouth at bedtime.    [provider]  diazepam (VALIUM) 10 MG tablet Take 20 mg by mouth at bedtime. spasms    [provider]  dicyclomine (BENTYL) 20 MG tablet Take 20 mg by mouth every 6 (six) hours.    [provider]  enalapril (VASOTEC) 2.5 MG tablet Take 2.5 mg by mouth every evening.    [provider]  FLUoxetine (PROZAC) 40 MG capsule Take 40 mg by mouth 2 (two) times daily.     [provider]  gabapentin (NEURONTIN) 300 MG capsule Take 300 mg by mouth 3 (three) times daily.     [provider]  gabapentin (NEURONTIN) 800 MG tablet Take 800 mg by mouth 4 (four) times daily.    [provider]   glipiZIDE-metformin (METAGLIP) 5-500 MG per tablet Take 1 tablet by mouth 2 (two) times daily before a meal.     [provider]  hydrOXYzine (ATARAX/VISTARIL) 25 MG tablet Take 25 mg by mouth 3 (three) times daily as needed for anxiety.    [provider]  insulin glargine (LANTUS) 100 UNIT/ML injection Inject 70 Units into the skin at bedtime.     [provider]  insulin glulisine (APIDRA) 100 UNIT/ML injection Inject 3-10 Units into the skin 3 (three) times daily before meals. Takes 3 units if sugar is below 150-then will increase as needed    [provider]  nitroGLYCERIN (NITROSTAT) 0.4 MG SL tablet Place 0.4 mg under the tongue every 5 (five) minutes as needed. Chest pain    [provider]  oxyCODONE-acetaminophen (PERCOCET) 10-325 MG per tablet Take 1 tablet by mouth See admin instructions. 1 tablet 5 times per day 12/25/11   Dimitri Ped, PA-C  prasugrel (EFFIENT) 10 MG TABS Take 10 mg by mouth daily after breakfast. States LD 12/16/11    [provider]  Critical care time: 90 minutes.  The treatment and management of the patient's condition was required based on the threat of imminent deterioration. This time reflects time spent by the physician evaluating, providing care and managing the critically ill patient's care. The time was spent at the immediate bedside (or on the same floor/unit and dedicated to this patient's care). Time involved in separately billable procedures is NOT included int he critical care time indicated above. Family meeting and update time may be included above if and only if the patient is unable/incompetent to participate in clinical interview and/or decision making, and the discussion was necessary to determining treatment decisions.   Marcelle Smiling, MD Board Certified by the ABIM, Pulmonary Diseases & Critical Care Medicine  [facility] Pulmonary/Critical Care Critical Care Pager: [phone]

## 2018-09-12 NOTE — Progress Notes (Signed)
I received patient to [id number] . I  oriented patient to room call bell and placed bed alarms on. Purewick replaced per patient's request as she was incontinent of urine on arrival.  She states her pain remains 7/10 and she complains of nausea but also ask for food and drink.

## 2018-09-12 NOTE — Progress Notes (Signed)
I responded to a [place] to provide prayer for the patient. The patient is unavailable for direct contact so I offered prayer outside of the patient's room.    09/12/18 0900  Clinical Encounter Type  Visited With Patient not available;Health care provider  Visit Type Spiritual support  Referral From Nurse  Consult/Referral To Chaplain  Spiritual Encounters  Spiritual Needs Prayer    Chaplain Dr Melvyn Novas

## 2018-09-12 NOTE — Progress Notes (Signed)
ANTICOAGULATION CONSULT NOTE - Initial Consult  Pharmacy Consult for heparin Indication: chest pain/ACS  Allergies  Allergen Reactions  . Codeine Hives  . Jardiance [Empagliflozin] Other (See Comments)    Makes blood sugar drop  . Sulfa Antibiotics Swelling  . Tramadol Hives    Patient Measurements: Height: 5\' 3"  (160 cm) Weight: 155 lb 10.3 oz (70.6 kg) IBW/kg (Calculated) : 52.4 Heparin Dosing Weight: 72kg  Vital Signs: Temp: 99.6 F (37.6 C) (03/30 1800) Temp Source: Oral (03/30 1800) BP: 159/83 (03/30 1813) Pulse Rate: 81 (03/30 1813)  Labs: Recent Labs    09/12/18 1202 09/12/18 1904  HEPARINUNFRC  --  <0.10*  TROPONINI 14.91*  --     CrCl cannot be calculated (Patient's most recent lab result is older than the maximum 21 days allowed.).   Medical History: Past Medical History:  Diagnosis Date  . Anxiety   . Coronary artery disease   . Depression   . Diabetes mellitus   . Frequency   . GERD (gastroesophageal reflux disease)   . Herniated disc   . Hyperlipidemia   . Myocardial infarction [facility]) 10/12   OV Dr Tomie China  12/22/11 with clearance note on chart , EKG  11/12 on chart    Assessment: 62 yoF [facility] and ACS r/o. Pt endorses CP, elevated troponins noted at [facility]. Pharmacy asked to dose IV heparin. Pt previously started on heparin at [facility] at unknown rate, this was held this am upon transfer with need for COVID-19 rule out precautions. Will avoid bolus and begin with ACS dosing nomogram.  Goal of Therapy:  Heparin level 0.3-0.7 units/ml Monitor platelets by anticoagulation protocol: Yes   Plan:  Heparin bolus 2500 units x1 Increase Heparin 1100 units/hr HL in AM  Ulyses Southward, PharmD, [place], AAHIVP, CPP Infectious Disease Pharmacist 09/12/2018 7:57 PM

## 2018-09-12 NOTE — Progress Notes (Signed)
ANTICOAGULATION CONSULT NOTE - Initial Consult  Pharmacy Consult for heparin Indication: chest pain/ACS  Allergies  Allergen Reactions  . Codeine Hives  . Sulfa Antibiotics Swelling  . Tramadol Hives    Patient Measurements: Height: 5' 2.5" (158.8 cm) Weight: 202 lb 9.6 oz (91.9 kg) IBW/kg (Calculated) : 51.25 Heparin Dosing Weight: 72kg  Vital Signs: Temp: 98.5 F (36.9 C) (03/30 0700) Temp Source: Oral (03/30 0700) BP: 136/71 (03/30 0645) Pulse Rate: 78 (03/30 0700)  Labs: No results for input(s): HGB, HCT, PLT, APTT, LABPROT, INR, HEPARINUNFRC, HEPRLOWMOCWT, CREATININE, CKTOTAL, CKMB, TROPONINI in the last 72 hours.  CrCl cannot be calculated (Patient's most recent lab result is older than the maximum 21 days allowed.).   Medical History: Past Medical History:  Diagnosis Date  . Anxiety   . Coronary artery disease   . Depression   . Diabetes mellitus   . Frequency   . GERD (gastroesophageal reflux disease)   . Herniated disc   . Hyperlipidemia   . Myocardial infarction [facility]) 10/12   OV Dr Tomie China  12/22/11 with clearance note on chart , EKG  11/12 on chart    Assessment: 62 yoF admitted from [facility] for COVID-19 and ACS r/o. Pt endorses CP, elevated troponins noted at [facility]. Pharmacy asked to dose IV heparin. Pt previously started on heparin at [facility] at unknown rate, this was held this am upon transfer with need for COVID-19 rule out precautions. Will avoid bolus and begin with ACS dosing nomogram.  Goal of Therapy:  Heparin level 0.3-0.7 units/ml Monitor platelets by anticoagulation protocol: Yes   Plan:  -Heparin 900 units/hr -Check 8hr heparin level   Fredonia Highland, PharmD, BCPS Clinical Pharmacist [phone] Please check AMION for all [facility] Pharmacy numbers 09/12/2018

## 2018-09-12 NOTE — Progress Notes (Signed)
Pt name and birthday confirmed by pt and myself while in negative pressure room. Additional pt armband placed on IV pole outside negative pressure room, confirmed by 2 RNs, myself and Suann Larry, Charity fundraiser.  Herma Ard, RN

## 2018-09-12 NOTE — Progress Notes (Signed)
Inpatient Diabetes Program Recommendations  AACE/ADA: New Consensus Statement on Inpatient Glycemic Control (2015)  Target Ranges:  Prepandial:   less than 140 mg/dL      Peak postprandial:   less than 180 mg/dL (1-2 hours)      Critically ill patients:  140 - 180 mg/dL   Lab Results  Component Value Date   GLUCAP 325 (H) 09/12/2018    Review of Glycemic Control Results for Mindy Love, Mindy Love (MRN [id number]) as of 09/12/2018 10:11  Ref. Range 09/12/2018 08:30  Glucose-Capillary Latest Ref Range: 70 - 99 mg/dL 315 (H)   Diabetes history: DM 2 Outpatient Diabetes medications: Metaglip 5-500 mg bid, Lantus 70 units q HS, Apidra 3-10 units tid Current orders for Inpatient glycemic control:  Novolog resistant q 4 hours  Inpatient Diabetes Program Recommendations:    Please add basal insulin.  Consider starting Lantus 35 units daily (1/2 of home medications).    Thanks,  Beryl Meager, RN, BC-ADM Inpatient Diabetes Coordinator Pager [phone] (8a-5p)

## 2018-09-13 ENCOUNTER — Encounter (HOSPITAL_COMMUNITY): Payer: Self-pay | Admitting: Cardiology

## 2018-09-13 DIAGNOSIS — R072 Precordial pain: Secondary | ICD-10-CM

## 2018-09-13 LAB — NOVEL CORONAVIRUS, NAA (HOSP ORDER, SEND-OUT TO REF LAB; TAT 18-24 HRS): SARS-CoV-2, NAA: NOT DETECTED

## 2018-09-13 LAB — COMPREHENSIVE METABOLIC PANEL
ALT: 15 U/L (ref 0–44)
AST: 30 U/L (ref 15–41)
Albumin: 2 g/dL — ABNORMAL LOW (ref 3.5–5.0)
Alkaline Phosphatase: 95 U/L (ref 38–126)
Anion gap: 10 (ref 5–15)
BUN: 16 mg/dL (ref 6–20)
CO2: 27 mmol/L (ref 22–32)
CREATININE: 1.1 mg/dL — AB (ref 0.44–1.00)
Calcium: 8.2 mg/dL — ABNORMAL LOW (ref 8.9–10.3)
Chloride: 100 mmol/L (ref 98–111)
GFR calc Af Amer: >60 mL/min (ref >60)
GFR calc non Af Amer: 55 mL/min — ABNORMAL LOW (ref >60)
GLUCOSE: 136 mg/dL — AB (ref 70–99)
Potassium: 4.1 mmol/L (ref 3.5–5.1)
Sodium: 137 mmol/L (ref 135–145)
Total Bilirubin: 1.2 mg/dL (ref 0.3–1.2)
Total Protein: 5.5 g/dL — ABNORMAL LOW (ref 6.5–8.1)

## 2018-09-13 LAB — GLUCOSE, CAPILLARY
GLUCOSE-CAPILLARY: 179 mg/dL — AB (ref 70–99)
Glucose-Capillary: 106 mg/dL — ABNORMAL HIGH (ref 70–99)
Glucose-Capillary: 120 mg/dL — ABNORMAL HIGH (ref 70–99)
Glucose-Capillary: 135 mg/dL — ABNORMAL HIGH (ref 70–99)
Glucose-Capillary: 152 mg/dL — ABNORMAL HIGH (ref 70–99)
Glucose-Capillary: 183 mg/dL — ABNORMAL HIGH (ref 70–99)
Glucose-Capillary: 244 mg/dL — ABNORMAL HIGH (ref 70–99)

## 2018-09-13 LAB — CBC
HCT: 42.2 % (ref 36.0–46.0)
Hemoglobin: 13.4 g/dL (ref 12.0–15.0)
MCH: 28.2 pg (ref 26.0–34.0)
MCHC: 31.8 g/dL (ref 30.0–36.0)
MCV: 88.7 fL (ref 80.0–100.0)
Platelets: 202 10*3/uL (ref 150–400)
RBC: 4.76 MIL/uL (ref 3.87–5.11)
RDW: 15.6 % — ABNORMAL HIGH (ref 11.5–15.5)
WBC: 15.5 10*3/uL — ABNORMAL HIGH (ref 4.0–10.5)
nRBC: 0 % (ref 0.0–0.2)

## 2018-09-13 LAB — HEPARIN LEVEL (UNFRACTIONATED)
Heparin Unfractionated: <0.1 IU/mL — ABNORMAL LOW (ref 0.30–0.70)
Heparin Unfractionated: <0.1 IU/mL — ABNORMAL LOW (ref 0.30–0.70)

## 2018-09-13 LAB — MAGNESIUM: Magnesium: 1.5 mg/dL — ABNORMAL LOW (ref 1.7–2.4)

## 2018-09-13 LAB — CK: Total CK: 133 U/L (ref 38–234)

## 2018-09-13 MED ORDER — HEPARIN BOLUS VIA INFUSION
2000.0000 [IU] | Freq: Once | INTRAVENOUS | Status: AC
  Administered 2018-09-13: 2000 [IU] via INTRAVENOUS
  Filled 2018-09-13: qty 2000

## 2018-09-13 MED ORDER — METOPROLOL TARTRATE 5 MG/5ML IV SOLN: 2.5000 mg | INTRAVENOUS | Status: DC | PRN

## 2018-09-13 MED ORDER — METOPROLOL TARTRATE 25 MG PO TABS
25.0000 mg | ORAL_TABLET | Freq: Two times a day (BID) | ORAL | Status: DC
  Administered 2018-09-13 – 2018-09-16 (×7): 25 mg via ORAL
  Filled 2018-09-13 (×2): qty 2
  Filled 2018-09-13 (×2): qty 1
  Filled 2018-09-13: qty 2
  Filled 2018-09-13 (×2): qty 1

## 2018-09-13 MED ORDER — HYDRALAZINE HCL 25 MG PO TABS
25.0000 mg | ORAL_TABLET | Freq: Two times a day (BID) | ORAL | Status: DC
  Administered 2018-09-13 – 2018-09-17 (×9): 25 mg via ORAL
  Filled 2018-09-13 (×10): qty 1

## 2018-09-13 MED ORDER — GABAPENTIN 800 MG PO TABS
400.0000 mg | ORAL_TABLET | Freq: Three times a day (TID) | ORAL | Status: DC
  Filled 2018-09-13: qty 0.5

## 2018-09-13 MED ORDER — ENOXAPARIN SODIUM 80 MG/0.8ML ~~LOC~~ SOLN
1.0000 mg/kg | Freq: Two times a day (BID) | SUBCUTANEOUS | Status: DC
  Administered 2018-09-14 – 2018-09-15 (×3): 70 mg via SUBCUTANEOUS
  Filled 2018-09-13: qty 0.8
  Filled 2018-09-13 (×2): qty 0.7

## 2018-09-13 MED ORDER — FUROSEMIDE 10 MG/ML IJ SOLN
40.0000 mg | Freq: Once | INTRAMUSCULAR | Status: AC
  Administered 2018-09-13: 40 mg via INTRAVENOUS

## 2018-09-13 MED ORDER — FUROSEMIDE 10 MG/ML IJ SOLN
40.0000 mg | Freq: Every day | INTRAMUSCULAR | Status: DC
  Administered 2018-09-13 – 2018-09-16 (×4): 40 mg via INTRAVENOUS
  Filled 2018-09-13 (×4): qty 4

## 2018-09-13 MED ORDER — ENOXAPARIN SODIUM 80 MG/0.8ML ~~LOC~~ SOLN
1.0000 mg/kg | Freq: Once | SUBCUTANEOUS | Status: AC
  Administered 2018-09-13: 70 mg via SUBCUTANEOUS
  Filled 2018-09-13: qty 0.7

## 2018-09-13 MED ORDER — GABAPENTIN 400 MG PO CAPS
400.0000 mg | ORAL_CAPSULE | Freq: Three times a day (TID) | ORAL | Status: DC
  Administered 2018-09-13 – 2018-09-19 (×17): 400 mg via ORAL
  Filled 2018-09-13 (×18): qty 1

## 2018-09-13 MED ORDER — ISOSORBIDE DINITRATE 10 MG PO TABS
10.0000 mg | ORAL_TABLET | Freq: Two times a day (BID) | ORAL | Status: DC
  Administered 2018-09-13 – 2018-09-17 (×9): 10 mg via ORAL
  Filled 2018-09-13 (×10): qty 1

## 2018-09-13 MED ORDER — NICOTINE 21 MG/24HR TD PT24
21.0000 mg | MEDICATED_PATCH | Freq: Every day | TRANSDERMAL | Status: DC
  Administered 2018-09-13 – 2018-09-19 (×7): 21 mg via TRANSDERMAL
  Filled 2018-09-13 (×7): qty 1

## 2018-09-13 MED ORDER — FUROSEMIDE 10 MG/ML IJ SOLN
INTRAMUSCULAR | Status: AC
  Filled 2018-09-13: qty 4

## 2018-09-13 MED ORDER — DULOXETINE HCL 60 MG PO CPEP
60.0000 mg | ORAL_CAPSULE | Freq: Two times a day (BID) | ORAL | Status: DC
  Administered 2018-09-13 – 2018-09-19 (×12): 60 mg via ORAL
  Filled 2018-09-13 (×13): qty 1

## 2018-09-13 NOTE — Progress Notes (Signed)
ANTICOAGULATION CONSULT NOTE   Pharmacy Consult for Heparin Indication: chest pain/ACS  Allergies  Allergen Reactions  . Codeine Hives  . Jardiance [Empagliflozin] Other (See Comments)    Makes blood sugar drop  . Sulfa Antibiotics Swelling  . Tramadol Hives    Patient Measurements: Height: 5\' 3"  (160 cm) Weight: 155 lb 10.3 oz (70.6 kg) IBW/kg (Calculated) : 52.4 Heparin Dosing Weight: 72kg  Vital Signs: Temp: 98.6 F (37 C) (03/31 0800) Temp Source: Oral (03/31 0800) BP: 155/72 (03/31 1400) Pulse Rate: 65 (03/31 1500)  Labs: Recent Labs    09/12/18 1202 09/12/18 1904 09/13/18 0436 09/13/18 1113 09/13/18 1452  HGB  --   --   --  13.4  --   HCT  --   --   --  42.2  --   PLT  --   --   --  202  --   HEPARINUNFRC  --  <0.10* <0.10*  --  <0.10*  CREATININE  --   --  1.10*  --   --   CKTOTAL  --   --  133  --   --   TROPONINI 14.91* 12.97*  --   --   --     Estimated Creatinine Clearance: 51.3 mL/min (A) (by C-G formula based on SCr of 1.1 mg/dL (H)).   Medical History: Past Medical History:  Diagnosis Date  . Anxiety   . CAD, multiple vessel 03/2011   Status post CABG x4 per patient report [facility])  . Chronic diastolic CHF (congestive heart failure), NYHA class 2 (HCC)    With mild systolic dysfunction by last echo  . Depression   . Diabetes mellitus   . Frequency   . GERD (gastroesophageal reflux disease)   . Herniated disc   . Hyperlipidemia   . Myocardial infarction (HCC) 10/12   OV Dr Tomie China  12/22/11 with clearance note on chart , EKG  11/12 on chart  . Paroxysmal atrial fibrillation (HCC)    Documented during COPD exacerbation hospitalization in 2018    Assessment: 62 yoF admitted from [facility] for COVID-19 and ACS r/o. Pt endorses CP, elevated troponins noted at [facility]. Pharmacy asked to dose IV heparin. Pt previously started on heparin at [facility] at unknown rate, this was held this am upon transfer with need for COVID-19  rule out precautions.   Her heparin remains to be subtherapeutic. Due to her risk of being COVID-19 r/o, we will try to minimize nurse contact with the patient due to PPE. D/w with Dr Molli Knock this PM, we will transition heparin to Lovenox. Her scr is 1.1. CBC wnl.   Goal of Therapy:  Anti-Xa 0.6-1 Monitor platelets by anticoagulation protocol: Yes   Plan:  Dc heparin Lovenox 70mg  SQ q12 Cont CBC monitoring  Ulyses Southward, PharmD, [place], AAHIVP, CPP Infectious Disease Pharmacist 09/13/2018 3:47 PM

## 2018-09-13 NOTE — Progress Notes (Signed)
ANTICOAGULATION CONSULT NOTE   Pharmacy Consult for Heparin Indication: chest pain/ACS  Allergies  Allergen Reactions  . Codeine Hives  . Jardiance [Empagliflozin] Other (See Comments)    Makes blood sugar drop  . Sulfa Antibiotics Swelling  . Tramadol Hives    Patient Measurements: Height: 5\' 3"  (160 cm) Weight: 155 lb 10.3 oz (70.6 kg) IBW/kg (Calculated) : 52.4 Heparin Dosing Weight: 72kg  Vital Signs: Temp: 98.4 F (36.9 C) (03/31 0400) Temp Source: Oral (03/31 0400) BP: 137/61 (03/31 0500) Pulse Rate: 69 (03/31 0500)  Labs: Recent Labs    09/12/18 1202 09/12/18 1904 09/13/18 0436  HEPARINUNFRC  --  <0.10* <0.10*  CREATININE  --   --  1.10*  CKTOTAL  --   --  133  TROPONINI 14.91* 12.97*  --     Estimated Creatinine Clearance: 51.3 mL/min (A) (by C-G formula based on SCr of 1.1 mg/dL (H)).   Medical History: Past Medical History:  Diagnosis Date  . Anxiety   . Coronary artery disease   . Depression   . Diabetes mellitus   . Frequency   . GERD (gastroesophageal reflux disease)   . Herniated disc   . Hyperlipidemia   . Myocardial infarction [facility]) 10/12   OV Dr Tomie China  12/22/11 with clearance note on chart , EKG  11/12 on chart    Assessment: 62 yoF admitted from [facility] for COVID-19 and ACS r/o. Pt endorses CP, elevated troponins noted at [facility]. Pharmacy asked to dose IV heparin. Pt previously started on heparin at [facility] at unknown rate, this was held this am upon transfer with need for COVID-19 rule out precautions.   3/31 AM update: heparin level low, no issues with infusion per RN.  Goal of Therapy:  Heparin level 0.3-0.7 units/ml Monitor platelets by anticoagulation protocol: Yes   Plan:  Heparin 2000 units BOLUS x 1  Inc heparin to 1250 units/hr Re-check heparin level in 8 hours  Abran Duke, PharmD, BCPS Clinical Pharmacist Phone: [phone]

## 2018-09-13 NOTE — Plan of Care (Signed)

## 2018-09-13 NOTE — Progress Notes (Signed)
eLink Physician-Brief Progress Note Patient Name: Mindy Love DOB: Apr 21, 1956 MRN: [id number]   Date of Service  09/13/2018  HPI/Events of Note  Patient requests home Cymbalta and Gabapentin.  eICU Interventions  Will order: 1. Cymbalta 60 mg PO BID. 2. Gabapentin 400 mg PO TID.      Intervention Category Major Interventions: Other:  Lenell Antu 09/13/2018, 10:03 PM

## 2018-09-13 NOTE — Progress Notes (Signed)
eLink Physician-Brief Progress Note Patient Name: Mindy Love DOB: 1956-02-20 MRN: [id number]   Date of Service  09/13/2018  HPI/Events of Note  Patient requests nicotine patch - History of tobacco abuse.   eICU Interventions  Will order: 1. Nicotine patch 21 mg to skin now and Q day.      Intervention Category Major Interventions: Other:  Sommer,Steven Dennard Nip 09/13/2018, 1:51 AM

## 2018-09-13 NOTE — Progress Notes (Signed)
NAME:  Mindy Love MRN:  [id number] DOB:  07-09-1955 LOS: 1 ADMISSION DATE:  09/12/2018 DATE OF SERVICE:  09/12/2018  CHIEF COMPLAINT: Acute hypoxemic respiratory failure  HISTORY & PHYSICAL  History of Present Illness  This 63 y.o. obese Caucasian female smoker transferred to the [facility] Emergency Department from [facility] for higher level of care.  She presented to [facility] health with complaints of fever and shortness of breath.  She was deemed to need COVID-19 ruled out.  She was originally slated to be transferred to [facility]; however, in the emergency department at [facility], her troponin went from 0.2 to 9+, prompting potential need for cardiology evaluation.  Consequently, she was transported to [facility].  Currently, she reports chest pain 8/10, refractory to sublingual NTG x 3 doses. Tightness in the back. Pleuritic pain under the R breast and paroxysmal coughing.  In the emergency department at [facility], she was observed to have SpO2 in the 70s.  Chest x-ray was obtained (report provided, images not included) with report of findings of pulmonary edema.  No fever at present but apparently had a fever of 102 (per nursing notes).  No known sick contacts.    Initial laboratory evaluation at [facility] health:  Sodium 132.  Potassium 4.5.  Chloride 100.  Bicarbonate 24.  BUN 20, creatinine 0.8.  Glucose 443.  Protein 6.2.  Albumin 3.1.  AST 21.  ALT 14.  T bili 0.5. Troponin-I 0.26-->9.13.  NT proBNP 6070.  LDH 562. Lactate 2.5.-->1.6. Procalcitonin < 0.05. WBC 16.8 (84.8% neutrophils, 9.8% lymphocytes, 3.8% monocytes).  Hemoglobin 15.4, hematocrit 47.3.  Platelets 302. PT 10.6.  INR 1.0.  APTT 25.9. ABG: pH 7.42, PCO2 40, PO2 160 on FiO2 100%. Urinalysis: Yellow urine, clear, specific gravity 1.005, pH 6, 2+ protein, 3+ glucose, negative ketones, 2+ occult blood.  2-5 WBC per high-power field, 2+ bacteria, negative  leukocyte esterase, negative nitrite. COVID-19 pending. Flu A negative.  Flu B negative. RSV negative. 12-lead EKG (09/11/2018 2345): Sinus tachycardia.  Normal axis.  T wave flattening in I, aVL, V5, V6.  ST depression in II, III, aVF.  ST elevation in V2 only.  Procedures:  N/A  Significant Diagnostic Tests:  N/A  Micro Data:   Results for orders placed or performed during the hospital encounter of 09/12/18  MRSA PCR Screening     Status: None   Collection Time: 09/12/18  5:48 AM  Result Value Ref Range Status   MRSA by PCR NEGATIVE NEGATIVE Final    Comment:        The GeneXpert MRSA Assay (FDA approved for NASAL specimens only), is one component of a comprehensive MRSA colonization surveillance program. It is not intended to diagnose MRSA infection nor to guide or monitor treatment for MRSA infections. Performed at [facility], [address] [place], [place] [address]       Antimicrobials:  N/A  Interim history/subjective:  N/A  Objective   BP (!) 142/60   Pulse 68   Temp 98.4 F (36.9 C) (Oral)   Resp (!) 22   Ht 5\' 3"  (1.6 m)   Wt 70.6 kg   SpO2 93%   BMI 27.57 kg/m     Filed Weights   09/12/18 0540 09/12/18 1500  Weight: 91.9 kg 70.6 kg    Intake/Output Summary (Last 24 hours) at 09/13/2018 1003 Last data filed at 09/13/2018 0800 Gross per 24 hour  Intake 1363.95 ml  Output 1050 ml  Net 313.95 ml    Examination: General: acutely ill appearing female, NAD HEENT: [place]/AT, PERRL, EOM-I and MMM Heart: RRR, Nl S1/S2 and -M/R/G Lung: Diffuse crackles Abdomen: Soft, NT, ND and +BS Ext: -edema and -tenderness Neuro: Intact  I reviewed CXR myself, diffuse infiltrate noted  Resolved Hospital Problem list   N/A Discussed with cardiology  Assessment & Plan:   ASSESSMENT/PLAN:  ASSESSMENT (included in the Hospital Problem List)  Principal Problem:   Acute hypoxemic respiratory failure (HCC) Active Problems:   Acute pulmonary edema  (HCC)   Tobacco abuse   STEMI (ST elevation myocardial infarction) (HCC)   Acute decompensated heart failure (HCC)   Leukocytosis   Type 2 diabetes mellitus with vascular disease (HCC)   By systems: PULMONARY  SARS-CoV-2 test pending  Acute hypoxemic respiratory failure  Acute pulmonary edema  Tobacco abuse - Delay CTA given the fact that we have a reason for hypoxemia and she is on heparin for NSTEMI anyway, will not risk kidneys incase needs cath. - Empiric antibiotics: Rocephin/azithromycin. - Smoking cessation counseling provided. - No BiPAP, if hypoxemia continues at this pace will intubate   CARDIOVASCULAR  ST elevation myocardial infarction  Acute decompensated heart failure, suspicious for post MI pump failure  Coronary artery disease status post CABG - Cardiology consult called. - In light of COVID-19 test pending, will we will attempt medical management of her MI.  12-lead EKG is a certainly concerning for lateral ischemia. - Aspirin, Effient, heparin drip, beta-blockade.  Continue statin.  Will hold off on ACE inhibitor/ARB at this time. - 2D echo. Per cards   RENAL - BMET in AM - Replace electrolytes as indicated - KVO IVF   GASTROINTESTINAL - Protonix - Carb modified heart healthy diet   HEMATOLOGIC  Leukocytosis with mild monocytosis  DVT PROPHYLAXIS: Heparin   INFECTIOUS  SARS-CoV-2/COVID-19 test pending   ENDOCRINE  Type 2 diabetes mellitus with vascular complications Accu-Cheks and sliding scale insulin.   NEUROLOGIC  Monitor clinically         Will hold in the ICU given 100% NRB need and high risk of intubation.  PCCM will continue to manage  Best practice:  Diet: Carb modified heart healthy diet Pain/Anxiety/Delirium protocol (if indicated): Morphine VAP protocol (if indicated): Not indicated DVT prophylaxis: Heparin infusion GI prophylaxis: Protonix Glucose control: Sliding scale insulin Mobility/Activity: Bedrest   Code  Status: Full Code Family Communication:  No family at the bedside Spouse: Piera Balam [phone] Disposition:    Labs   CBC: No results for input(s): WBC, NEUTROABS, HGB, HCT, MCV, PLT in the last 168 hours.  Basic Metabolic Panel: Recent Labs  Lab 09/13/18 0436  NA 137  K 4.1  CL 100  CO2 27  GLUCOSE 136*  BUN 16  CREATININE 1.10*  CALCIUM 8.2*  MG 1.5*   GFR: Estimated Creatinine Clearance: 51.3 mL/min (A) (by C-G formula based on SCr of 1.1 mg/dL (H)). No results for input(s): PROCALCITON, WBC, LATICACIDVEN in the last 168 hours.  Liver Function Tests: Recent Labs  Lab 09/13/18 0436  AST 30  ALT 15  ALKPHOS 95  BILITOT 1.2  PROT 5.5*  ALBUMIN 2.0*   No results for input(s): LIPASE, AMYLASE in the last 168 hours. No results for input(s): AMMONIA in the last 168 hours.  ABG No results found for: PHART, PCO2ART, PO2ART, HCO3, TCO2, ACIDBASEDEF, O2SAT   Coagulation Profile: No results for input(s): INR, PROTIME in the last 168 hours.  Cardiac Enzymes:  Recent Labs  Lab 09/12/18 1202 09/12/18 1904 09/13/18 0436  CKTOTAL  --   --  133  TROPONINI 14.91* 12.97*  --     HbA1C: No results found for: HGBA1C  CBG: Recent Labs  Lab 09/12/18 1802 09/12/18 1939 09/12/18 2319 09/13/18 0425 09/13/18 0808  GLUCAP 83 143* 106* 135* 120*   The patient is critically ill with multiple organ systems failure and requires high complexity decision making for assessment and support, frequent evaluation and titration of therapies, application of advanced monitoring technologies and extensive interpretation of multiple databases.   Critical Care Time devoted to patient care services described in this note is  32  Minutes. This time reflects time of care of this signee Dr Koren Bound. This critical care time does not reflect procedure time, or teaching time or supervisory time of PA/NP/Med student/Med Resident etc but could involve care discussion time.  Alyson Reedy, M.D. [facility] Pulmonary/Critical Care Medicine. Pager: [phone]. After hours pager: [phone].

## 2018-09-13 NOTE — Consult Note (Addendum)
Cardiology Consultation:   Virtual Visit via Video Note   This visit type was conducted due to national recommendations for restrictions regarding the COVID-19 Pandemic (e.g. social distancing).  This format is felt to be most appropriate for this patient at this time.  All issues noted in this document were discussed and addressed.  No physical exam was performed (except for noted visual exam findings with Video Visits).   Patient ID: Mindy Love MRN: [id number]; DOB: September 23, 1955  Admit date: 09/12/2018 Date of Consult: 09/13/2018  Primary Care Provider: [name]., MD Primary Cardiologist: No primary care provider on file.  Reportedly was seeing a cardiologist in [facility], but she does not know the name.  Has not been seen in over 3 years. Primary Electrophysiologist:  None    Patient Profile:   Mindy Love is a 63 y.o. female with a hx CAD s/p CABGx4 (reportedly in roughly 2012, [facility]), multiple COPD-CHF admissions, paroxysmal A. fib, PAD s/p right AKA, IDDM, GERD, HL and depression of who is being seen today for the evaluation of elevated troponin at the request of Dr. Molli Knock.  History of Present Illness:   Mindy Love is a 62 yo female with PMH noted above. In review of notes in Care Everywhere appears she has a hx of CAD s/p CABG, along with chronic systolic HF with last known EF of 45-50%. Followed through [facility]. Also with PAD s/p right AKA. Notes also indicate remote hx of Afib noted during an echo back in 2017, though does not appear to be on [facility] per home medication list.   She presented to [facility] with fever and shortness of breath. It was deemed she would be transferred to [facility] with plans for COVID-19 rule out, but troponin 0.2>>9 prompting transfer to [facility] for further evaluation via cardiology. Per chart, she reported chest pain 8/10 on admission with improvement after SL nitro x3. Also with tightness in her back and pleuritic pain under her right  breast. In the ED at [facility] her O2 sats were in the 70s, with reported CXR suggestive of pulmonary edema. No fever on admission but Tmax reported as 102 in nursing notes PTA.  Labs at [facility] as follows:  Trop 0.26>>9.13 Lactate 2.5>>1.6 Procal <0.05 WBC 16.8  H/H 15.4/47.3 Flu A/B neg, RSV neg EKG-ST with TW changes in lead I, aVL, v5,v6, ST depression in II, III, aVF  She has been placed on IV heparin. Troponin peaked at 14.91 now trending down at 12.97. Electrolytes stable with the exception of Mag 1.5, Ca++ 8.2, Cr 1.10. CXR here with cardiomegaly, with mild pulmonary edema. EKG shows SR with nonspecific TW changes. Echo showed EF of 25-30% with moderately dilated left atrium, no WMA noted.   CURRENTLY: Mindy Love indicates that she has been having pretty much persistent chest discomfort along the lower rib cage and radiating to her back.  She says this is what she has pneumonia episodes.  She says that when she had her heart attack prior to her CABG, she had pain up the left side of her chest radiating down her arm.  This is not consistent with that type chest pain.  The pain currently is a more sharp stabbing type pain as opposed to pressure pain as well.  More consistent with pleuritic type pain.  It is exacerbated by coughing.  CP better with Morphine & not NTG infusion. Per nursing, she is been having frequent coughing fits as well as intermittent bursts of what  appears to be PAT versus very short atrial fibrillation (more likely PAT with rates in the 120s 130s).  On my video call, she would have drops in her oxygen saturation when taking her mask off.  Currently on nonrebreather mask with high risk for possible intubation.  She states that whenever she gets the morphine she is able to rest for about an hour and then has recurrent symptoms once the morphine wears off.  Past Medical History:  Diagnosis Date  . Anxiety   . CAD, multiple vessel 03/2011   Status post CABG x4 per patient  report [facility])  . Chronic diastolic CHF (congestive heart failure), NYHA class 2 (HCC)    With mild systolic dysfunction by last echo  . Depression   . Diabetes mellitus   . Frequency   . GERD (gastroesophageal reflux disease)   . Herniated disc   . Hyperlipidemia   . Myocardial infarction (HCC) 10/12   OV Dr Tomie China  12/22/11 with clearance note on chart , EKG  11/12 on chart  . Paroxysmal atrial fibrillation (HCC)    Documented during COPD exacerbation hospitalization in 2018  --History of CABG in roughly 2012 at [facility].  Has not followed up with a cardiologist in over 3 years, despite having CHF admissions to the hospital on several occasions.   Past Surgical History:  Procedure Laterality Date  .  NASAL SURG    . APPENDECTOMY    . BACK SURGERY    . CARDIAC CATHETERIZATION  10/12  . CARDIAC STENTS     Unknown details, but would be prior to CABG  . CORONARY ARTERY BYPASS GRAFT  X 4 VESSELS 2012   [facility]  . EYE SURGERY     bilateral lasik  . LUMBAR LAMINECTOMY/DECOMPRESSION MICRODISCECTOMY  06/25/2011   Procedure: LUMBAR LAMINECTOMY/DECOMPRESSION MICRODISCECTOMY;  Surgeon: Jacki Cones;  Location: [facility];  Service: Orthopedics;  Laterality: Right;  Lumbar Hemi Laminectomy L5 - S1 on the Right/Microdiscectomy on the Right  (X-Ray  . LUMBAR LAMINECTOMY/DECOMPRESSION MICRODISCECTOMY  12/23/2011   Procedure: LUMBAR LAMINECTOMY/DECOMPRESSION MICRODISCECTOMY;  Surgeon: Jacki Cones, MD;  Location: [facility];  Service: Orthopedics;  Laterality: Right;  Hemi Laminectomy, Microdiscectomy L5-S1 on right  . OOPHORECTOMY    . right above knee amputation    . ROTATOR CUFF REPAIR  right  . TRANSTHORACIC ECHOCARDIOGRAM  12/2016   Mild LV dilation.  Mild LVH.  Mildly decreased EF of 45 to 50% with postsurgical hypokinesis of the interventricular septum consistent with CABG.  Severe abnormal diastolic filling (grade 2/pseudonormalization).   Moderate-severe LA dilation.  Mild MR.  Mild aortic sclerosis but no stenosis.     Home Medications:  Prior to Admission medications   Medication Sig Start Date End Date Taking? Authorizing Provider  aspirin EC 81 MG tablet Take 81 mg by mouth daily after breakfast. States LD 12/16/11   Yes [provider]  atorvastatin (LIPITOR) 20 MG tablet Take 20 mg by mouth daily.   Yes [provider]  brimonidine (ALPHAGAN) 0.2 % ophthalmic solution Place 1 drop into the left eye 3 (three) times daily. 08/19/18  Yes [provider]  clopidogrel (PLAVIX) 75 MG tablet Take 75 mg by mouth daily. 07/29/17  Yes [provider]  dicyclomine (BENTYL) 20 MG tablet Take 20 mg by mouth every 6 (six) hours.   Yes [provider]  DULoxetine (CYMBALTA) 60 MG capsule Take 60 mg by mouth 2 (two) times daily. 08/09/17  04/20/19 Yes [provider]  gabapentin (NEURONTIN) 400 MG capsule Take 400 mg by mouth 3 (three) times daily.   Yes [provider]  insulin aspart (NOVOLOG) 100 UNIT/ML injection Inject 1-12 Units into the skin See admin instructions. Sliding scale per chart for each meal   Yes [provider]  insulin glargine (LANTUS) 100 UNIT/ML injection Inject 40 Units into the skin at bedtime.    Yes [provider]  nitroGLYCERIN (NITROSTAT) 0.4 MG SL tablet Place 0.4 mg under the tongue every 5 (five) minutes as needed. Chest pain   Yes [provider]  Oxycodone HCl 10 MG TABS Take 10 mg by mouth 5 (five) times daily. 09/08/18  Yes [provider]  timolol (TIMOPTIC) 0.5 % ophthalmic solution Apply 1 drop to eye 2 (two) times daily. 08/18/18 08/18/19 Yes [provider]  oxyCODONE-acetaminophen (PERCOCET) 10-325 MG per tablet Take 1 tablet by mouth See admin instructions. 1 tablet 5 times per day Patient not taking: Reported on 09/12/2018 12/25/11   Dimitri Ped, PA-C    Inpatient Medications: Scheduled Meds: .  aspirin EC  81 mg Oral Daily  . atorvastatin  80 mg Oral q1800  . Chlorhexidine Gluconate Cloth  6 each Topical Daily  . insulin aspart  0-20 Units Subcutaneous Q4H  . insulin glargine  35 Units Subcutaneous Daily  . metoprolol tartrate  25 mg Oral BID  . nicotine  21 mg Transdermal Daily  . sodium chloride flush  3 mL Intravenous Q12H   Continuous Infusions: . sodium chloride 10 mL/hr at 09/12/18 1900  . azithromycin Stopped (09/13/18 1045)  . cefTRIAXone (ROCEPHIN)  IV Stopped (09/13/18 0825)  . heparin 1,250 Units/hr (09/13/18 1100)   PRN Meds: sodium chloride, acetaminophen, morphine injection, ondansetron (ZOFRAN) IV  Allergies:    Allergies  Allergen Reactions  . Codeine Hives  . Jardiance [Empagliflozin] Other (See Comments)    Makes blood sugar drop  . Sulfa Antibiotics Swelling  . Tramadol Hives    Social History:   Social History   Tobacco Use  . Smoking status: Current Every Day Smoker    Packs/day: 1.00    Types: Cigarettes    Last attempt to quit: 03/23/2009    Years since quitting: 9.4  . Smokeless tobacco: Never Used  Substance Use Topics  . Alcohol use: No  . Drug use: No  \ Social History   Social History Narrative  . Not on file     Family History:   Family History  Problem Relation Age of Onset  . Hypertension Father   . Hyperlipidemia Father      ROS:  Please see the history of present illness.   All other ROS reviewed and negative.     Physical Exam/Data:   Vitals:   09/13/18 0800 09/13/18 0900 09/13/18 1000 09/13/18 1100  BP: (!) 153/68 (!) 142/60 (!) 146/115 (!) 113/51  Pulse: 74 68 80 72  Resp: 18 (!) 22 12 (!) 26  Temp:      TempSrc:      SpO2: 92% 93% 93% 97%  Weight:      Height:        Intake/Output Summary (Last 24 hours) at 09/13/2018 1255 Last data filed at 09/13/2018 1100 Gross per 24 hour  Intake 1534.06 ml  Output 1450 ml  Net 84.06 ml   Last 3 Weights 09/12/2018 09/12/2018 10/30/2016  Weight (lbs) 155 lb  10.3 oz 202 lb 9.6 oz 215 lb  Weight (kg) 70.6  kg 91.9 kg 97.523 kg     Body mass index is 27.57 kg/m.  -Visual examination General:  Well nourished, nontoxic appearing, but somewhat tired. Musculoskeletal: Right AKA l Neuro: Nonfocal. Psych:  Normal affect   EKG:  The EKG was personally reviewed and demonstrates: Sinus rhythm with inferolateral ST depressions.  Unfortunate no prior EKG to review.  Telemetry:  Telemetry was personally reviewed and demonstrates: A m ostly sinus rhythm with intermittent bursts of PAT with rates of 120s to 130s  Relevant CV Studies:  [facility]: Mild LV dilation.  Mild LVH.  Mildly decreased EF of 45 to 50% with postsurgical hypokinesis of the interventricular septum consistent with CABG.  Severe abnormal diastolic filling (grade 2/pseudonormalization).  Moderate-severe LA dilation.  Mild MR.  Mild aortic sclerosis but no stenosis.  Laboratory Data:  Chemistry Recent Labs  Lab 09/13/18 0436  NA 137  K 4.1  CL 100  CO2 27  GLUCOSE 136*  BUN 16  CREATININE 1.10*  CALCIUM 8.2*  GFRNONAA 55*  GFRAA >60  ANIONGAP 10    Recent Labs  Lab 09/13/18 0436  PROT 5.5*  ALBUMIN 2.0*  AST 30  ALT 15  ALKPHOS 95  BILITOT 1.2   Hematology Recent Labs  Lab 09/13/18 1113  WBC 15.5*  RBC 4.76  HGB 13.4  HCT 42.2  MCV 88.7  MCH 28.2  MCHC 31.8  RDW 15.6*  PLT 202   Cardiac Enzymes Recent Labs  Lab 09/12/18 1202 09/12/18 1904  TROPONINI 14.91* 12.97*   No results for input(s): TROPIPOC in the last 168 hours.  BNPNo results for input(s): BNP, PROBNP in the last 168 hours.  DDimer No results for input(s): DDIMER in the last 168 hours.  Radiology/Studies:  Dg Chest Port 1 View  Result Date: 09/12/2018 CLINICAL DATA:  Chest pain, hypertension, diabetes mellitus, coronary artery disease post MI, GERD EXAM: PORTABLE CHEST 1 VIEW COMPARISON:  Portable exam 0747 hours compared to  09/12/2018 at 0024 hours FINDINGS: Enlargement of cardiac silhouette post CABG. Slight pulmonary vascular congestion. Pulmonary infiltrates favor pulmonary edema over infection. No pleural effusion or pneumothorax. Bones demineralized. IMPRESSION: Enlargement of cardiac silhouette post CABG with pulmonary vascular congestion and suspected mild pulmonary edema. Electronically Signed   By: Ulyses SouthwardMark  Boles M.D.   On: 09/12/2018 08:18    Assessment and Plan:   Mindy Love is a 63 y.o. female with a hx CAD s/p CABGx4 with chronic DHF & PAF, PAD s/p right AKA, COPD IDDM, GERD, HL and depression of who is being seen today for the evaluation of elevated troponin at the request of Dr. Molli KnockYacoub.  1. NSTEMI versus demand ischemia from myocarditis (please note this is not a STEMI):  Troponin peaked at 14.91. EKG with acute ischemic changes. Echo with new finding of EF at 25% no rWMA noted.  --No obvious regional wall motion normality is noted.  Would be more consistent with cardiomyopathy than MI.  Symptoms are not consistent with her prior anginal symptoms, more consistent with pleuritic pain.  Currently on IV heparin. Does have hx of prior CABG, presumably 2012 we will need to obtain records). Does not appear to have had any ischemic work up in recent years. Will plan to continue with medical therapy at this time.  -- would convert to SQ Lovenox if appropriate (with maintaining distancing) as it would minimize volume   currently on metoprolol 25mg  BID, consider switching to Coreg versus Toprol.  Also consider afterload reduction with hydralazine in case, however after several nitroglycerin, her present pressures are relatively stable. -->  We will add low-dose hydralazine plus isosorbide dinitrate.  May benefit from spirolactone assuming renal stays stable. -If blood pressure stable tomorrow, will add low-dose spironolactone.  given concern for COVID, plan to avoid ACEi/ARB at this time  --At this point, her  pain seems more consistent with pleuritic type pain as opposed anginal pain as it is persistent the entire day.  Would continue pain relief with narcotics as necessary.  Avoid NSAIDs for coag precautions. Would not recommend cardiac catheterization at this time: Would like to have CABG performed prior to consider catheterization, and also would like to have confirmation of COVID-19 negative as suspicion for true ACS is lower than for myopericarditis.    2. Acute Respiratory failure with hypoxia: Currently on NRB with concern for possible decompensation. High risk for intubation, therefore current in the ICU.   On antibiotics per primary/PCCM  With intermittent fever, concern for pneumonia versus possible COVID-19 --> currently being ruled out, however on precautions  Cannot exclude component of pulmonary edema given elevated proBNP (however chest x-ray does not suggest significant pulmonary), net urine output is essentially net even.    Could consider standing IV diuretic.  3.  Paroxysmal A. fib, PAT: Brief runs of rapid rates usually exacerbated by coughing.  I think this is probably more consistent with PAT then PAF although she does have a history of PAF (was not actively being treated).  For now, given that she is mostly in sinus rhythm, would prefer to avoid antiarrhythmic.  Continue beta-blocker at current dose as blood pressure tolerates, could potentially titrate up.  We will add low-dose IV metoprolol for breakthrough episodes.  If she has prolonged episodes of these irregular heart rapid beats, may consider short-term amiodarone (although not a good choice for long-term)  We will review telemetry with EP, however I do not think that this is truly consistent with A. fib given how short the bursts are.  4.  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  He she certainly has multivessel CAD, and cannot exclude ischemic etiology.  Inappropriate will, we would like to consider ischemic evaluation, however would like to delay until we have confirmation of COVID-19 status.  At this point, we will add low-dose hydralazine and nitrate, potentially starting low-dose spironolactone in the morning if renal function and blood pressures are stable.  Will write for once daily IV Lasix (may need more PRN)  No signs of shock at this point.  (However would like limit antihypertensives as her pressures are now stable).  5. IDDM: SSI per PCCM    For questions or updates, please contact [facility] Please consult [email or web address] for contact info under     Signed, Bryan Lemma, MD  09/13/2018 12:55 PM

## 2018-09-13 NOTE — Progress Notes (Signed)
eLink Physician-Brief Progress Note Patient Name: Mindy Love DOB: 03-07-1956 MRN: [id number]   Date of Service  09/13/2018  HPI/Events of Note  Increased respiratory distress - Sat = 96% and RR = 22 on 100% NRBM. CXR looks wet and LVEF = 25-30%, however, patient is R/o COVID-19 and BiPAP would aerosolize virus.  eICU Interventions  Will order: 1. Lasix 40 mg IV X 1 now.  2. Follow clinically for improvement or deterioration.      Intervention Category Major Interventions: Hypoxemia - evaluation and management  Sommer,Steven Eugene 09/13/2018, 12:18 AM

## 2018-09-13 NOTE — Progress Notes (Addendum)
In the room with the patient while patient was having a coughing episode. Patient was unable to maintain Oxygenation so removed Ventimask and placed Non re breather at 15 ml. Patient's heart rate became very irregular. Adminstered planned pain medication and that medication helps patient resolve coughing fit. Heart rate returned to normal sinus rhythm. Patient was not able to maintain oxygenation per orders so left patient on non re breather. Informed Dr. Molli Knock with no new orders. Will continue to monitor.

## 2018-09-14 DIAGNOSIS — I214 Non-ST elevation (NSTEMI) myocardial infarction: Secondary | ICD-10-CM

## 2018-09-14 DIAGNOSIS — I420 Dilated cardiomyopathy: Secondary | ICD-10-CM

## 2018-09-14 DIAGNOSIS — I471 Supraventricular tachycardia: Secondary | ICD-10-CM

## 2018-09-14 DIAGNOSIS — I25119 Atherosclerotic heart disease of native coronary artery with unspecified angina pectoris: Secondary | ICD-10-CM

## 2018-09-14 LAB — COMPREHENSIVE METABOLIC PANEL
ALK PHOS: 88 U/L (ref 38–126)
ALT: 13 U/L (ref 0–44)
AST: 17 U/L (ref 15–41)
Albumin: 1.7 g/dL — ABNORMAL LOW (ref 3.5–5.0)
Anion gap: 10 (ref 5–15)
BUN: 23 mg/dL — ABNORMAL HIGH (ref 6–20)
CALCIUM: 8.2 mg/dL — AB (ref 8.9–10.3)
CO2: 27 mmol/L (ref 22–32)
Chloride: 98 mmol/L (ref 98–111)
Creatinine, Ser: 0.99 mg/dL (ref 0.44–1.00)
GFR calc Af Amer: >60 mL/min (ref >60)
GFR calc non Af Amer: >60 mL/min (ref >60)
Glucose, Bld: 206 mg/dL — ABNORMAL HIGH (ref 70–99)
Potassium: 4 mmol/L (ref 3.5–5.1)
SODIUM: 135 mmol/L (ref 135–145)
Total Bilirubin: 0.8 mg/dL (ref 0.3–1.2)
Total Protein: 5.5 g/dL — ABNORMAL LOW (ref 6.5–8.1)

## 2018-09-14 LAB — CBC WITH DIFFERENTIAL/PLATELET
Abs Immature Granulocytes: 0.06 10*3/uL (ref 0.00–0.07)
Basophils Absolute: 0.1 10*3/uL (ref 0.0–0.1)
Basophils Relative: 0 %
Eosinophils Absolute: 0.1 10*3/uL (ref 0.0–0.5)
Eosinophils Relative: 1 %
HCT: 38.3 % (ref 36.0–46.0)
Hemoglobin: 11.9 g/dL — ABNORMAL LOW (ref 12.0–15.0)
Immature Granulocytes: 0 %
Lymphocytes Relative: 12 %
Lymphs Abs: 1.7 10*3/uL (ref 0.7–4.0)
MCH: 27.1 pg (ref 26.0–34.0)
MCHC: 31.1 g/dL (ref 30.0–36.0)
MCV: 87.2 fL (ref 80.0–100.0)
Monocytes Absolute: 0.9 10*3/uL (ref 0.1–1.0)
Monocytes Relative: 6 %
Neutro Abs: 10.9 10*3/uL — ABNORMAL HIGH (ref 1.7–7.7)
Neutrophils Relative %: 81 %
Platelets: 252 10*3/uL (ref 150–400)
RBC: 4.39 MIL/uL (ref 3.87–5.11)
RDW: 15.2 % (ref 11.5–15.5)
WBC: 13.7 10*3/uL — AB (ref 4.0–10.5)
nRBC: 0 % (ref 0.0–0.2)

## 2018-09-14 LAB — GLUCOSE, CAPILLARY
Glucose-Capillary: 109 mg/dL — ABNORMAL HIGH (ref 70–99)
Glucose-Capillary: 126 mg/dL — ABNORMAL HIGH (ref 70–99)
Glucose-Capillary: 128 mg/dL — ABNORMAL HIGH (ref 70–99)
Glucose-Capillary: 148 mg/dL — ABNORMAL HIGH (ref 70–99)
Glucose-Capillary: 151 mg/dL — ABNORMAL HIGH (ref 70–99)
Glucose-Capillary: 199 mg/dL — ABNORMAL HIGH (ref 70–99)

## 2018-09-14 LAB — MAGNESIUM: Magnesium: 1.7 mg/dL (ref 1.7–2.4)

## 2018-09-14 LAB — CK: Total CK: 48 U/L (ref 38–234)

## 2018-09-14 MED ORDER — SPIRONOLACTONE 12.5 MG HALF TABLET
12.5000 mg | ORAL_TABLET | Freq: Every day | ORAL | Status: DC
  Administered 2018-09-15 – 2018-09-19 (×5): 12.5 mg via ORAL
  Filled 2018-09-14 (×5): qty 1

## 2018-09-14 MED ORDER — AZITHROMYCIN 250 MG PO TABS
500.0000 mg | ORAL_TABLET | Freq: Every day | ORAL | Status: AC
  Administered 2018-09-15 – 2018-09-16 (×2): 500 mg via ORAL
  Filled 2018-09-14 (×2): qty 2

## 2018-09-14 MED ORDER — MAGNESIUM SULFATE 2 GM/50ML IV SOLN
2.0000 g | Freq: Once | INTRAVENOUS | Status: AC
  Administered 2018-09-14: 12:00:00 2 g via INTRAVENOUS
  Filled 2018-09-14: qty 50

## 2018-09-14 NOTE — Progress Notes (Signed)
Progress Note  Patient Name: Mindy Love Date of Encounter: 09/14/2018  Primary Cardiologist: No primary care provider on file. [facility] remotely  Subjective   Feels a lot better today.  Dyspnea notably improved.  Still has some intermittent sharp chest pain up underneath both ribs and more in her back now. Only on nasal cannula oxygen.  Notably, COVID-19 study came back negative.  Inpatient Medications    Scheduled Meds: . aspirin EC  81 mg Oral Daily  . atorvastatin  80 mg Oral q1800  . [START ON 09/15/2018] azithromycin  500 mg Oral Daily  . Chlorhexidine Gluconate Cloth  6 each Topical Daily  . DULoxetine  60 mg Oral BID  . enoxaparin (LOVENOX) injection  1 mg/kg Subcutaneous Q12H  . furosemide  40 mg Intravenous Daily  . gabapentin  400 mg Oral TID  . hydrALAZINE  25 mg Oral BID  . insulin aspart  0-20 Units Subcutaneous Q4H  . insulin glargine  35 Units Subcutaneous Daily  . isosorbide dinitrate  10 mg Oral BID  . metoprolol tartrate  25 mg Oral BID  . nicotine  21 mg Transdermal Daily  . sodium chloride flush  3 mL Intravenous Q12H   Continuous Infusions: . sodium chloride 10 mL/hr at 09/14/18 1300  . cefTRIAXone (ROCEPHIN)  IV Stopped (09/14/18 0845)   PRN Meds: sodium chloride, acetaminophen, metoprolol tartrate, morphine injection, ondansetron (ZOFRAN) IV   Vital Signs    Vitals:   09/14/18 1100 09/14/18 1200 09/14/18 1332 09/14/18 1400  BP:  138/76 (!) 142/64 (!) 120/54  Pulse: 65  73 71  Resp: 19  (!) 24 (!) 21  Temp:  98.2 F (36.8 C)    TempSrc:  Oral    SpO2: 95%  90% 94%  Weight:      Height:        Intake/Output Summary (Last 24 hours) at 09/14/2018 1443 Last data filed at 09/14/2018 1351 Gross per 24 hour  Intake 942.57 ml  Output 1500 ml  Net -557.43 ml   Last 3 Weights 09/12/2018 09/12/2018 10/30/2016  Weight (lbs) 155 lb 10.3 oz 202 lb 9.6 oz 215 lb  Weight (kg) 70.6 kg 91.9 kg 97.523 kg      Telemetry    Sinus rhythm.  No further PAT  episodes.- Personally Reviewed  ECG    Not checked- Personally Reviewed  Physical Exam   GEN: No acute distress.   Neck: No JVD MS: No edema; No deformity. Neuro:  Nonfocal  Psych: Normal affect   Labs    Chemistry Recent Labs  Lab 09/13/18 0436 09/14/18 0438  NA 137 135  K 4.1 4.0  CL 100 98  CO2 27 27  GLUCOSE 136* 206*  BUN 16 23*  CREATININE 1.10* 0.99  CALCIUM 8.2* 8.2*  PROT 5.5* 5.5*  ALBUMIN 2.0* 1.7*  AST 30 17  ALT 15 13  ALKPHOS 95 88  BILITOT 1.2 0.8  GFRNONAA 55* >60  GFRAA >60 >60  ANIONGAP 10 10     Hematology Recent Labs  Lab 09/13/18 1113 09/14/18 0438  WBC 15.5* 13.7*  RBC 4.76 4.39  HGB 13.4 11.9*  HCT 42.2 38.3  MCV 88.7 87.2  MCH 28.2 27.1  MCHC 31.8 31.1  RDW 15.6* 15.2  PLT 202 252    Cardiac Enzymes Recent Labs  Lab 09/12/18 1202 09/12/18 1904  TROPONINI 14.91* 12.97*   No results for input(s): TROPIPOC in the last 168 hours.   BNPNo results for input(s):  BNP, PROBNP in the last 168 hours.   DDimer No results for input(s): DDIMER in the last 168 hours.   Radiology    No results found.  Cardiac Studies   TTE: 09/12/18  IMPRESSIONS   1. The left ventricle has severely reduced systolic function, with an ejection fraction of 25-30%. The cavity size was moderately dilated. There is mildly increased left ventricular wall thickness. Left ventricular diastolic Doppler parameters are  consistent with pseudonormalization. Elevated left ventricular end-diastolic pressure.  2. The right ventricle has normal systolic function. The cavity was normal. There is no increase in right ventricular wall thickness.  3. Left atrial size was moderately dilated.  4. The mitral valve is degenerative. Moderate thickening of the mitral valve leaflet. Moderate calcification of the mitral valve leaflet. There is moderate mitral annular calcification present.  5. The aortic valve is tricuspid. Severely thickening of the aortic valve.  Sclerosis without any evidence of stenosis of the aortic valve. Aortic valve regurgitation is mild by color flow Doppler.  Patient Profile     63 y.o. female hx CAD s/p CABGx4 '12 at [facility], multiple COPD-CHF admissions, paroxysmal A. fib, PAD s/p right AKA, IDDM, GERD, HL and depression who presented with acute CHF and elevated troponins.   Assessment & Plan    1. NSTEMI versus demand ischemia from myocarditis: Troponin peaked at 14.91. EKG with acute ischemic changes. Echo with new finding of EF at 25% no rWMA noted.   -- records obtained from [facility] with CABG in 2012 LIMA- LAD, SVG-RCA, seq SVG-diag/OM of Lcx.   has been converted from IV heparin to Lovenox to reduce contact  -would continue for 72-hour total  currently on metoprolol 25mg  BID, plan to switch to Toprol starting tomorrow (50 mg).   Afterload reduction with Bidil. -- Given her history, would benefit from further ischemic evaluation to reassess grafts at some point during this admission. --  Anticipate that this would be a right left heart cath on Friday to allow for more time for her hypoxia and coughing to abate.  2. Acute Respiratory failure with hypoxia: Improving today with plan to transfer to stepdown per PCCM.  -- On antibiotics per primary/PCCM, and COVID-19 --> negative.  3.  Paroxysmal A. fib, PAT: Brief runs of rapid rates noted on telemetry usually exacerbated by coughing.  --No further episodes once we start a beta-blocker.  Has not needed as needed. .  Continue beta-blocker at current dose as blood pressure tolerates, -converting to long-acting Toprol.. -- If she has prolonged episodes of these irregular heart rapid beats, may consider short-term amiodarone (although not a good choice for long-term).  4.  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  He she certainly has multivessel CAD, and cannot exclude ischemic etiology. Would benefit from further ischemic work up once stable from a respiratory standpoint.  -- continue low-dose hydralazine and nitrate.   Plan starting Spironolactone.  If blood pressure stable tomorrow, will add low-dose spironolactone. COVID screen is negative today, therefore could add ARB--> Entresto?    5. IDDM: SSI per PCCM  For questions or updates, please contact [facility] Please consult [email or web address] for contact info under     Preliminary chart biopsy performed by Laverda Page, NP  09/14/2018, 2:43 PM     ATTENDING ATTESTATION  I  have seen, examined and evaluated the patient this PM --I personally saw and evaluated the patient.  After reviewing all the available data and chart, we discussed the patients laboratory, study & physical findings as well as symptoms in detail. I agree with her findings, examination as well as impression recommendations as per our discussion.   Visual physical exam performed by the undersigned  Attending adjustments noted in italics.   Overall recovering well.  Adding spironolactone today.  Converting to Toprol 50 mg tomorrow morning.  Will consider possibly converting from isosorbide dinitrate/hydralazine to [facility] prior to discharge (but would like to wait till after catheterization)  Plan Right Left Heart Catheterization probably Friday versus tomorrow afternoon.    Bryan Lemma, M.D., M.S. Interventional Cardiologist   Pager # [phone] Phone # [phone] [address] [place], [place] [address]

## 2018-09-14 NOTE — Progress Notes (Addendum)
NAME:  Mindy Love MRN:  [id number] DOB:  12/07/1955 LOS: 2 ADMISSION DATE:  09/12/2018 DATE OF SERVICE:  09/12/2018  CHIEF COMPLAINT: Acute hypoxemic respiratory failure  HISTORY & PHYSICAL  History of Present Illness  This 63 y.o. obese Caucasian female smoker transferred to the [facility] Emergency Department from [facility] for higher level of care.  She presented to [facility] with complaints of fever and shortness of breath.  She was deemed to need COVID-19 ruled out.  She was originally slated to be transferred to [facility]; however, in the emergency department at [facility], her troponin went from 0.2 to 9+, prompting potential need for cardiology evaluation.  Consequently, she was transported to [facility].  Currently, she reports chest pain 8/10, refractory to sublingual NTG x 3 doses. Tightness in the back. Pleuritic pain under the R breast and paroxysmal coughing.  In the emergency department at [facility], she was observed to have SpO2 in the 70s.  Chest x-ray was obtained (report provided, images not included) with report of findings of pulmonary edema.  No fever at present but apparently had a fever of 102 (per nursing notes).  No known sick contacts.    Initial laboratory evaluation at [facility] health:  Sodium 132.  Potassium 4.5.  Chloride 100.  Bicarbonate 24.  BUN 20, creatinine 0.8.  Glucose 443.  Protein 6.2.  Albumin 3.1.  AST 21.  ALT 14.  T bili 0.5. Troponin-I 0.26-->9.13.  NT proBNP 6070.  LDH 562. Lactate 2.5.-->1.6. Procalcitonin < 0.05. WBC 16.8 (84.8% neutrophils, 9.8% lymphocytes, 3.8% monocytes).  Hemoglobin 15.4, hematocrit 47.3.  Platelets 302. PT 10.6.  INR 1.0.  APTT 25.9. ABG: pH 7.42, PCO2 40, PO2 160 on FiO2 100%. Urinalysis: Yellow urine, clear, specific gravity 1.005, pH 6, 2+ protein, 3+ glucose, negative ketones, 2+ occult blood.  2-5 WBC per high-power field, 2+ bacteria, negative  leukocyte esterase, negative nitrite. COVID-19 pending. Flu A negative.  Flu B negative. RSV negative. 12-lead EKG (09/11/2018 2345): Sinus tachycardia.  Normal axis.  T wave flattening in I, aVL, V5, V6.  ST depression in II, III, aVF.  ST elevation in V2 only.  Procedures:  N/A  Significant Diagnostic Tests:  N/A  Micro Data:   Results for orders placed or performed during the hospital encounter of 09/12/18  MRSA PCR Screening     Status: None   Collection Time: 09/12/18  5:48 AM  Result Value Ref Range Status   MRSA by PCR NEGATIVE NEGATIVE Final    Comment:        The GeneXpert MRSA Assay (FDA approved for NASAL specimens only), is one component of a comprehensive MRSA colonization surveillance program. It is not intended to diagnose MRSA infection nor to guide or monitor treatment for MRSA infections. Performed at [facility], [address], [place], [place] [address]   Novel Coronavirus, NAA (hospital order; send-out to ref lab)     Status: None   Collection Time: 09/12/18  8:33 AM  Result Value Ref Range Status   SARS-CoV-2, NAA NOT DETECTED NOT DETECTED Final    Comment: Negative (Not Detected) results do not exclude infection caused by SARS CoV 2 and should not be used as the sole basis for treatment or other patient management decisions. Optimum specimen types and timing for peak viral levels during infections caused  by SARS CoV 2 have not been determined. Collection of multiple specimens (types and time points)  from the same patient may be necessary to detect the virus. Improper specimen collection and handling, sequence variability underlying assay primers and or probes, or the presence of organisms in  quantities less than the limit of detection of the assay may lead to false negative results. Positive and negative predictive values of testing are highly dependent on prevalence. False negative results are more likely when prevalence of disease is high.  (NOTE) The expected result is Negative (Not Detected). The SARS CoV 2 test is intended for the presumptive qualitative  detection of nucleic acid from SARS CoV 2 in upper and lower  respir atory specimens. Testing methodology is real time RT PCR. Test results must be correlated with clinical presentation and  evaluated in the context of other laboratory and epidemiologic data.  Test performance can be affected because the epidemiology and  clinical spectrum of infection caused by SARS CoV 2 is not fully  known. For example, the optimum types of specimens to collect and  when during the course of infection these specimens are most likely  to contain detectable viral RNA may not be known. This test has not been Food and Drug Administration (FDA) cleared or  approved and has been authorized by FDA under an Emergency Use  Authorization (EUA). The test is only authorized for the duration of  the declaration that circumstances exist justifying the authorization  of emergency use of in vitro diagnostic tests for detection and or  diagnosis of SARS CoV 2 under Section 564(b)(1) of the Act, 21 U.S.C.  section 7794243692 3(b)(1), unless the authorization is terminated or   revoked sooner. [facility] is certified under the  Clinical Laboratory Improvement Amendments of 1988 (CLIA), 42 U.S.C.  section [phone], to perform high complexity tests. Performed at [facility] CLIA [id number] [address], [address], [address], [place], [place] [address] Laboratory Director: Turner Daniels, MD    Coronavirus Source NASOPHARYNGEAL  Final    Comment: Performed at [facility], [address] [place], [place] [address]      Antimicrobials:  N/A  Interim history/subjective:  Reports feeling better continues to have generalized.  Plan to transfer to stepdown unit replacement  Objective   BP (!) 147/63 (BP Location: Left Arm)   Pulse 73   Temp 98.4 F (36.9 C)  (Oral)   Resp (!) 21   Ht 5\' 3"  (1.6 m)   Wt 70.6 kg   SpO2 91%   BMI 27.57 kg/m     Filed Weights   09/12/18 0540 09/12/18 1500  Weight: 91.9 kg 70.6 kg    Intake/Output Summary (Last 24 hours) at 09/14/2018 1002 Last data filed at 09/14/2018 0600 Gross per 24 hour  Intake 418.64 ml  Output 1100 ml  Net -681.36 ml    Examination: General: Appears older than stated age of 30, complains of pain generalized discomfort HEENT: No JVD or lymphadenopathy is appreciated Neuro: Appears intact CV: s1s2 rrr, no m/r/g PULM: even/non-labored, lungs bilaterally diminished in bases UG:AYGE, non-tender, bsx4 active  Extremities: Right BKA is noted, left hemi-metatarsal noted Skin: no rashes or lesions tenderness Neuro: Intact  09/14/2019"  Resolved Hospital Problem list   N/A Discussed with cardiology  Assessment & Plan:   ASSESSMENT/PLAN:  ASSESSMENT (included in the Hospital Problem List)  Principal Problem:   Acute hypoxemic respiratory failure (HCC) Active Problems:   Acute pulmonary edema (HCC)   Tobacco abuse   Non-STEMI (non-ST elevated myocardial infarction) (HCC)  Acute decompensated heart failure (HCC)   Leukocytosis   Type 2 diabetes mellitus with vascular disease (HCC)   By systems: PULMONARY  SARS-CoV-2 test pending  Acute hypoxemic respiratory failure  Acute pulmonary edema  Tobacco abuse -Wean oxygen as tolerated -Continue empirical treatment for pneumonia -Diuresis as tolerated -Bronchial dilators as needed   CARDIOVASCULAR  ST elevation myocardial infarction  Acute decompensated heart failure, suspicious for post MI pump failure  Coronary artery disease status post CABG -Cardiology is following -Most likely demand no acute ST elevated infarction -Spironolactone to be instituted by cardiology -Transfer to stepdown unit cardiology to continue to follow   RENAL Lab Results  Component Value Date   CREATININE 0.99 09/14/2018   CREATININE  1.10 (H) 09/13/2018   CREATININE 1.24 (H) 10/30/2016   Recent Labs  Lab 09/13/18 0436 09/14/18 0438  K 4.1 4.0    -Follow electrolytes -Place electrolytes as needed daily mag sulfate  41/2020 - KVO IVF   GASTROINTESTINAL -Continue Protonix -Carb modified diet  HEMATOLOGIC  DVT prophylaxis with heparin   INFECTIOUS  Negative COVID-19  Continue to treat suspected pneumonia with Zithromax and Rocephin   ENDOCRINE  Type 2 diabetes mellitus with vascular complications Continue sliding scale insulin   NEUROLOGIC  Continue to monitor             09/14/2018 COVID- COVID-19 is negative decreasing FiO2 needs plan to transfer to stepdown unit prior to release cardiology is following for elevated troponin  Best practice:  Diet: Carb modified heart healthy diet Pain/Anxiety/Delirium protocol (if indicated): Morphine VAP protocol (if indicated): Not indicated DVT prophylaxis: Heparin infusion GI prophylaxis: Protonix Glucose control: Sliding scale insulin Mobility/Activity: Bedrest   Code Status: Full Code Family Communication: 09/14/2018 no family at bedside Spouse: Denah Mott [phone] Disposition:    Labs   CBC: Recent Labs  Lab 09/13/18 1113 09/14/18 0438  WBC 15.5* 13.7*  NEUTROABS  --  10.9*  HGB 13.4 11.9*  HCT 42.2 38.3  MCV 88.7 87.2  PLT 202 252    Basic Metabolic Panel: Recent Labs  Lab 09/13/18 0436 09/14/18 0438  NA 137 135  K 4.1 4.0  CL 100 98  CO2 27 27  GLUCOSE 136* 206*  BUN 16 23*  CREATININE 1.10* 0.99  CALCIUM 8.2* 8.2*  MG 1.5* 1.7   GFR: Estimated Creatinine Clearance: 57 mL/min (by C-G formula based on SCr of 0.99 mg/dL). Recent Labs  Lab 09/13/18 1113 09/14/18 0438  WBC 15.5* 13.7*    Liver Function Tests: Recent Labs  Lab 09/13/18 0436 09/14/18 0438  AST 30 17  ALT 15 13  ALKPHOS 95 88  BILITOT 1.2 0.8  PROT 5.5* 5.5*  ALBUMIN 2.0* 1.7*   No results for input(s): LIPASE, AMYLASE in the last 168  hours. No results for input(s): AMMONIA in the last 168 hours.  ABG No results found for: PHART, PCO2ART, PO2ART, HCO3, TCO2, ACIDBASEDEF, O2SAT   Coagulation Profile: No results for input(s): INR, PROTIME in the last 168 hours.  Cardiac Enzymes: Recent Labs  Lab 09/12/18 1202 09/12/18 1904 09/13/18 0436 09/14/18 0438  CKTOTAL  --   --  133 48  TROPONINI 14.91* 12.97*  --   --     HbA1C: No results found for: HGBA1C  CBG: Recent Labs  Lab 09/13/18 1548 09/13/18 1947 09/13/18 2348 09/14/18 0519 09/14/18 0812  GLUCAP 179* 244* 183* 199* 151*   App cct 30 min  Brett Canales Minor ACNP Adolph Pollack PCCM Pager [phone] till 1  pm If no answer page [phone] 09/14/2018, 10:02 AM  Attending Note:  63 year old female with extensive vascular history who presents to [facility] instead of [facility] due to concerns CAD.  Patient was being transferred for COVID R/O.  COVID test came back negative and O2 demand has decreased with diureses.  On exam, patient is awake and interactive, moving all ext to command with bibasilar crackles.  I reviewed CXR myself, no acute disease noted.  Discussed with PCCM-NP and [facility]-MD.  Acute hypoxemic respiratory failure:  - Titrate O2 for sat of 88-92%  Acute pulmonary edema:  - Lasix as ordered  NSTEMI:  - Cards following  - Tele monitoring  - Cath per cards  COVID: Test is negative  - D/C precautions  DM:  - SSI  - CBGs  Transfer out of the ICU and to [facility] service with PCCM off 4/2  Patient seen and examined, agree with above note.  I dictated the care and orders written for this patient under my direction.  Alyson Reedy, MD [phone]

## 2018-09-15 ENCOUNTER — Inpatient Hospital Stay (HOSPITAL_COMMUNITY): Payer: Medicare HMO

## 2018-09-15 DIAGNOSIS — J181 Lobar pneumonia, unspecified organism: Secondary | ICD-10-CM

## 2018-09-15 DIAGNOSIS — Z720 Tobacco use: Secondary | ICD-10-CM

## 2018-09-15 DIAGNOSIS — E1165 Type 2 diabetes mellitus with hyperglycemia: Secondary | ICD-10-CM

## 2018-09-15 DIAGNOSIS — E118 Type 2 diabetes mellitus with unspecified complications: Secondary | ICD-10-CM

## 2018-09-15 DIAGNOSIS — I5041 Acute combined systolic (congestive) and diastolic (congestive) heart failure: Secondary | ICD-10-CM

## 2018-09-15 LAB — CBC WITH DIFFERENTIAL/PLATELET
Abs Immature Granulocytes: 0.04 10*3/uL (ref 0.00–0.07)
Basophils Absolute: 0.1 10*3/uL (ref 0.0–0.1)
Basophils Relative: 1 %
Eosinophils Absolute: 0.4 10*3/uL (ref 0.0–0.5)
Eosinophils Relative: 3 %
HCT: 35.7 % — ABNORMAL LOW (ref 36.0–46.0)
Hemoglobin: 11.2 g/dL — ABNORMAL LOW (ref 12.0–15.0)
Immature Granulocytes: 0 %
Lymphocytes Relative: 14 %
Lymphs Abs: 1.8 10*3/uL (ref 0.7–4.0)
MCH: 27.4 pg (ref 26.0–34.0)
MCHC: 31.4 g/dL (ref 30.0–36.0)
MCV: 87.3 fL (ref 80.0–100.0)
Monocytes Absolute: 0.8 10*3/uL (ref 0.1–1.0)
Monocytes Relative: 6 %
Neutro Abs: 9.6 10*3/uL — ABNORMAL HIGH (ref 1.7–7.7)
Neutrophils Relative %: 76 %
Platelets: 271 10*3/uL (ref 150–400)
RBC: 4.09 MIL/uL (ref 3.87–5.11)
RDW: 15.1 % (ref 11.5–15.5)
WBC: 12.8 10*3/uL — ABNORMAL HIGH (ref 4.0–10.5)
nRBC: 0 % (ref 0.0–0.2)

## 2018-09-15 LAB — COMPREHENSIVE METABOLIC PANEL
ALT: 12 U/L (ref 0–44)
AST: 15 U/L (ref 15–41)
Albumin: 1.7 g/dL — ABNORMAL LOW (ref 3.5–5.0)
Alkaline Phosphatase: 96 U/L (ref 38–126)
Anion gap: 7 (ref 5–15)
BUN: 24 mg/dL — ABNORMAL HIGH (ref 6–20)
CO2: 29 mmol/L (ref 22–32)
Calcium: 8.1 mg/dL — ABNORMAL LOW (ref 8.9–10.3)
Chloride: 100 mmol/L (ref 98–111)
Creatinine, Ser: 0.94 mg/dL (ref 0.44–1.00)
GFR calc Af Amer: >60 mL/min (ref >60)
GFR calc non Af Amer: >60 mL/min (ref >60)
Glucose, Bld: 104 mg/dL — ABNORMAL HIGH (ref 70–99)
Potassium: 3.8 mmol/L (ref 3.5–5.1)
Sodium: 136 mmol/L (ref 135–145)
Total Bilirubin: 0.6 mg/dL (ref 0.3–1.2)
Total Protein: 5.4 g/dL — ABNORMAL LOW (ref 6.5–8.1)

## 2018-09-15 LAB — GLUCOSE, CAPILLARY
Glucose-Capillary: 92 mg/dL (ref 70–99)
Glucose-Capillary: 205 mg/dL — ABNORMAL HIGH (ref 70–99)
Glucose-Capillary: 176 mg/dL — ABNORMAL HIGH (ref 70–99)
Glucose-Capillary: 131 mg/dL — ABNORMAL HIGH (ref 70–99)
Glucose-Capillary: 221 mg/dL — ABNORMAL HIGH (ref 70–99)

## 2018-09-15 LAB — PHOSPHORUS: Phosphorus: 2.7 mg/dL (ref 2.5–4.6)

## 2018-09-15 LAB — CK: Total CK: 30 U/L — ABNORMAL LOW (ref 38–234)

## 2018-09-15 LAB — MAGNESIUM: Magnesium: 2.2 mg/dL (ref 1.7–2.4)

## 2018-09-15 MED ORDER — SODIUM CHLORIDE 0.9% FLUSH
3.0000 mL | Freq: Two times a day (BID) | INTRAVENOUS | Status: DC
  Administered 2018-09-15 – 2018-09-16 (×3): 3 mL via INTRAVENOUS

## 2018-09-15 MED ORDER — ASPIRIN 81 MG PO CHEW
81.0000 mg | CHEWABLE_TABLET | ORAL | Status: AC
  Administered 2018-09-16: 06:00:00 81 mg via ORAL
  Filled 2018-09-15: qty 1

## 2018-09-15 MED ORDER — SODIUM CHLORIDE 0.9% FLUSH: 3.0000 mL | INTRAVENOUS | Status: DC | PRN

## 2018-09-15 MED ORDER — ENOXAPARIN SODIUM 100 MG/ML ~~LOC~~ SOLN
90.0000 mg | Freq: Two times a day (BID) | SUBCUTANEOUS | Status: DC
  Administered 2018-09-15 – 2018-09-16 (×2): 90 mg via SUBCUTANEOUS
  Filled 2018-09-15 (×2): qty 1

## 2018-09-15 MED ORDER — SODIUM CHLORIDE 0.9 % IV SOLN: 250.0000 mL | INTRAVENOUS | Status: DC | PRN

## 2018-09-15 MED ORDER — METHYLPREDNISOLONE SODIUM SUCC 125 MG IJ SOLR
60.0000 mg | INTRAMUSCULAR | Status: DC
  Administered 2018-09-15 – 2018-09-17 (×3): 60 mg via INTRAVENOUS
  Filled 2018-09-15 (×3): qty 2

## 2018-09-15 MED ORDER — SODIUM CHLORIDE 0.9 % IV SOLN
INTRAVENOUS | Status: DC
  Administered 2018-09-16: 06:00:00 via INTRAVENOUS

## 2018-09-15 MED ORDER — IPRATROPIUM-ALBUTEROL 0.5-2.5 (3) MG/3ML IN SOLN
3.0000 mL | Freq: Four times a day (QID) | RESPIRATORY_TRACT | Status: DC
  Administered 2018-09-15 – 2018-09-16 (×2): 3 mL via RESPIRATORY_TRACT
  Filled 2018-09-15 (×2): qty 3

## 2018-09-15 NOTE — Progress Notes (Addendum)
Progress Note  Patient Name: Mindy Love Date of Encounter: 09/15/2018  Primary Cardiologist: No primary care provider on file. HP remotely  Subjective   Patient still short of breath, requiring O2.  Not currently having any chest pain, but abdomen is sore.  She says that is chronic.  Agreeable to cardiac catheterization tomorrow  Inpatient Medications    Scheduled Meds: . aspirin EC  81 mg Oral Daily  . atorvastatin  80 mg Oral q1800  . azithromycin  500 mg Oral Daily  . Chlorhexidine Gluconate Cloth  6 each Topical Daily  . DULoxetine  60 mg Oral BID  . enoxaparin (LOVENOX) injection  1 mg/kg Subcutaneous Q12H  . furosemide  40 mg Intravenous Daily  . gabapentin  400 mg Oral TID  . hydrALAZINE  25 mg Oral BID  . insulin aspart  0-20 Units Subcutaneous Q4H  . insulin glargine  35 Units Subcutaneous Daily  . isosorbide dinitrate  10 mg Oral BID  . metoprolol tartrate  25 mg Oral BID  . nicotine  21 mg Transdermal Daily  . sodium chloride flush  3 mL Intravenous Q12H  . spironolactone  12.5 mg Oral Daily   Continuous Infusions: . sodium chloride 10 mL/hr at 09/14/18 1300  . cefTRIAXone (ROCEPHIN)  IV 1 g (09/15/18 0830)   PRN Meds: sodium chloride, acetaminophen, metoprolol tartrate, morphine injection, ondansetron (ZOFRAN) IV   Vital Signs    Vitals:   09/14/18 2223 09/15/18 0430 09/15/18 0831 09/15/18 0836  BP: 138/64 122/62 (!) 145/62   Pulse: 68 70  77  Resp:  16    Temp: 98.2 F (36.8 C) 98.3 F (36.8 C)    TempSrc: Oral Oral    SpO2: 92% 94%    Weight:  96.3 kg    Height:        Intake/Output Summary (Last 24 hours) at 09/15/2018 0848 Last data filed at 09/15/2018 0836 Gross per 24 hour  Intake 833.42 ml  Output 1100 ml  Net -266.58 ml   Last 3 Weights 09/15/2018 09/12/2018 09/12/2018  Weight (lbs) 212 lb 4.8 oz 155 lb 10.3 oz 202 lb 9.6 oz  Weight (kg) 96.299 kg 70.6 kg 91.9 kg      Telemetry    Sinus rhythm, sinus tach at times, no PAT.-  Personally Reviewed  ECG    None today- Personally Reviewed  Physical Exam   General: Well developed, obese, female in no acute distress Head: Eyes PERRLA, No xanthomas.   Normocephalic and atraumatic Lungs: Bibasilar rales. Heart: HRRR S1 S2, without MRG.  Upper extremity pulses are 2+ & equal. JVD 9 cm. Abdomen: Bowel sounds are present, abdomen soft and diffusely tender without masses or  hernias noted. Msk: Normal strength and tone for age. Extremities: No clubbing, cyanosis or edema. S/p left forefoot amputation and right AKA Skin:  No rashes or lesions noted. Neuro: Alert and oriented X 3. Psych:  Peri Jefferson affect, responds appropriately   Labs    Chemistry Recent Labs  Lab 09/13/18 0436 09/14/18 0438 09/15/18 0415  NA 137 135 136  K 4.1 4.0 3.8  CL 100 98 100  CO2 27 27 29   GLUCOSE 136* 206* 104*  BUN 16 23* 24*  CREATININE 1.10* 0.99 0.94  CALCIUM 8.2* 8.2* 8.1*  PROT 5.5* 5.5* 5.4*  ALBUMIN 2.0* 1.7* 1.7*  AST 30 17 15   ALT 15 13 12   ALKPHOS 95 88 96  BILITOT 1.2 0.8 0.6  GFRNONAA 55* >60 >60  GFRAA >60 >60 >60  ANIONGAP 10 10 7      Hematology Recent Labs  Lab 09/13/18 1113 09/14/18 0438 09/15/18 0415  WBC 15.5* 13.7* 12.8*  RBC 4.76 4.39 4.09  HGB 13.4 11.9* 11.2*  HCT 42.2 38.3 35.7*  MCV 88.7 87.2 87.3  MCH 28.2 27.1 27.4  MCHC 31.8 31.1 31.4  RDW 15.6* 15.2 15.1  PLT 202 252 271    Cardiac Enzymes Recent Labs  Lab 09/12/18 1202 09/12/18 1904  TROPONINI 14.91* 12.97*    Radiology    Dg Chest Port 1 View  Result Date: 09/15/2018 CLINICAL DATA:  Shortness of breath and cough EXAM: PORTABLE CHEST 1 VIEW COMPARISON:  09/12/2018 FINDINGS: Cardiac shadow is within normal limits. Lungs are well aerated bilaterally. Diffuse patchy opacities are noted bilaterally increased in the interval from the prior exam consistent with CHF. Underlying atelectasis/early infiltrate is noted in the bases bilaterally. No pneumothorax is seen. The bony  structures are within normal limits. Postsurgical changes are again noted. IMPRESSION: Changes consistent with CHF with superimposed bibasilar atelectasis/early infiltrates. Electronically Signed   By: Alcide Clever M.D.   On: 09/15/2018 07:42    Cardiac Studies   TTE: 09/12/18  IMPRESSIONS   1. The left ventricle has severely reduced systolic function, with an ejection fraction of 25-30%. The cavity size was moderately dilated. There is mildly increased left ventricular wall thickness. Left ventricular diastolic Doppler parameters are  consistent with pseudonormalization. Elevated left ventricular end-diastolic pressure.  2. The right ventricle has normal systolic function. The cavity was normal. There is no increase in right ventricular wall thickness.  3. Left atrial size was moderately dilated.  4. The mitral valve is degenerative. Moderate thickening of the mitral valve leaflet. Moderate calcification of the mitral valve leaflet. There is moderate mitral annular calcification present.  5. The aortic valve is tricuspid. Severely thickening of the aortic valve. Sclerosis without any evidence of stenosis of the aortic valve. Aortic valve regurgitation is mild by color flow Doppler.  Patient Profile     63 y.o. female hx CAD s/p CABGx4 '12 at [facility], multiple COPD-CHF admissions, paroxysmal A. fib, PAD s/p right AKA, IDDM, GERD, HL and depression who presented with acute CHF and elevated troponins.   Assessment & Plan    1. NSTEMI versus demand ischemia from myocarditis:  -Peak troponin XIV 0.91 - EF nearly 25%, but no RWMAs noted -CABG in 2012 was LIMA-LAD, SVG-diag-OM, SVG-RCA -Heparin started 3-30 and converted to Lovenox on 3-31, MD to advise if we continue this until her cath or not -Change short acting Lopressor to Toprol-XL 50 mg daily - Continue BiDil and Spironolactone -We will tentatively schedule her for right/left heart catheterization tomorrow, on for 2nd case, Dr [place].    2. Acute Respiratory failure with hypoxia:  - Per IM, patient transferred from ICU on 4/1 - Still requiring 6 L O2 by cannula -We will recheck chest x-ray in a.m. and decide if patient respiratory status is good enough for cath  3.  Paroxysmal A. fib, PAT:  - Brief episodes of tachycardia associated with coughing have improved since being started on a beta-blocker -Continue to follow on telemetry, MD advise if event monitor needed after discharge  4.  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 Would wait until after cath to add ARB or Entresto, her BUN is climbing some with diuresis  5. IDDM:  Management per IM    Theodore Demark, PA-C  09/15/2018, 8:48 AM     ATTENDING ATTESTATION  I have seen, examined and evaluated the patient this PM along with Theodore Demark, PA-C.  After reviewing all the available data and chart, we discussed the patients laboratory, study & physical findings as well as symptoms in detail. I agree with her findings, examination as well as impression recommendations as per our discussion.    Slow recovery from what seems to be PNA & potentially NSTEMI with new drop in EF on Echo (Cardiomyopathy) - ? Ischemic vs. Stress induced.  Has known CAD-CABG.   With ongoing ? Re Ischemic or Non-ischemic CM & NSTEMI vs. Demand Ischemia, will plan R&LHC tomorrow -- hopefully will help determine diuretic requirement (BUN going up - hold AM dose of Furosemide).  Will need to convert Metoprolol to Toprol &  post-cath, will need to consider changing from Isordil/Hydral to [facility] prior to d/c. Spironolactone started yesterday.      Bryan Lemma, M.D., M.S. Interventional Cardiologist   Pager # [phone] Phone # [phone] [address] [place], [place] [address]   For questions or updates, please contact [facility] Please consult [email or web address] for contact info under

## 2018-09-15 NOTE — Progress Notes (Addendum)
PROGRESS NOTE    Mindy Love  BXU:[id number] DOB: 1955/11/06 DOA: 09/12/2018 PCP: [name]., MD   Brief Narrative:  63 y.o. obese BF PMHx anxiety, depression, diabetes type 2 uncontrolled with complication, MI, CAD, HLD, CAD, tobacco abuse  Transferred to the [facility] Department from [facility] for higher level of care.  She presented to [facility] health with complaints of fever and shortness of breath.  She was deemed to need COVID-19 ruled out.  She was originally slated to be transferred to [facility]; however, in the emergency department at [facility], her troponin went from 0.2 to 9+, prompting potential need for cardiology evaluation.  Consequently, she was transported to [facility].  Currently, she reports chest pain 8/10, refractory to sublingual NTG x 3 doses. Tightness in the back. Pleuritic pain under the R breast and paroxysmal coughing.   In the emergency department at [facility], she was observed to have SpO2 in the 70s.  Chest x-ray was obtained (report provided, images not included) with report of findings of pulmonary edema.  No fever at present but apparently had a fever of 102 (per nursing notes).  No known sick contacts.      Subjective: 4/2A/O x4, positive S OB, positive CP.  States not on home O2.  States continues to smoke but knows she needs to stop.   Assessment & Plan:   Principal Problem:   Acute hypoxemic respiratory failure (HCC) Active Problems:   Acute pulmonary edema (HCC)   Tobacco abuse   Non-STEMI (non-ST elevated myocardial infarction) (HCC)   Acute decompensated heart failure (HCC)   Leukocytosis   Type 2 diabetes mellitus with vascular disease (HCC)  Acute respiratory failure with hypoxia - Titrate O2 to maintain SPO2 89 to 92% -Complete 7-day course antibiotics - DuoNebs QID - Pulmonary toilet - Solu-Medrol 60 mg daily  CAP  -See acute respiratory failure  SARS/C  coronavirus - Some concern for patient having coronavirus but patient negative for coronavirus.  Tobacco abuse -Patient counseled extensively on risk factor of continued to smoke especially in pandemic.  Patient understands to continue to do so puts her at high risk of death. - Nicotine patch  Acute systolic and diastolic CHF - Strict in and out -Daily weight - Left/right heart cath pending: Acute MI? -ASA 81 mg daily - Lasix 40 mg daily - Hydralazine 25 mg twice daily -Isosorbide dinitrate 10 mg twice daily -Metoprolol 25 mg twice daily -Spironolactone 12.5 mg daily  Diabetes type 2 uncontrolled with complications -  Hemoglobin A1c pending  -Lipid panel pending -Lantus 35 units daily - Resistant SSI    DVT prophylaxis: Full dose Lovenox Code Status: Full Family Communication: None Disposition Plan: Per cardiology   Consultants:  Cardiology    Procedures/Significant Events:  3/30 echocardiogram: LVEF =25-30%.-  Elevated left ventricular end-diastolic pressure -Left Atrium: moderately dilated    I have personally reviewed and interpreted all radiology studies and my findings are as above.  VENTILATOR SETTINGS:    Cultures 3/30 coronavirus negative 3/30 MRSA by PCR negative   Antimicrobials: Anti-infectives (From admission, onward)   Start     Ordered Stop   09/15/18 1000  azithromycin (ZITHROMAX) tablet 500 mg     09/14/18 1020 09/17/18 0959   09/12/18 0800  cefTRIAXone (ROCEPHIN) 1 g in sodium chloride 0.9 % 100 mL IVPB     09/12/18 0758 09/19/18 0759   09/12/18 0800  azithromycin (ZITHROMAX) 500 mg in  sodium chloride 0.9 % 250 mL IVPB  Status:  Discontinued     09/12/18 0758 09/14/18 1020       Devices None   LINES / TUBES:  None    Continuous Infusions: . sodium chloride 10 mL/hr at 09/14/18 1300  . cefTRIAXone (ROCEPHIN)  IV 1 g (09/15/18 0830)     Objective: Vitals:   09/14/18 2223 09/15/18 0430 09/15/18 0831 09/15/18 0836  BP:  138/64 122/62 (!) 145/62   Pulse: 68 70  77  Resp:  16    Temp: 98.2 F (36.8 C) 98.3 F (36.8 C)    TempSrc: Oral Oral    SpO2: 92% 94%    Weight:  96.3 kg    Height:        Intake/Output Summary (Last 24 hours) at 09/15/2018 0853 Last data filed at 09/15/2018 5643 Gross per 24 hour  Intake 833.42 ml  Output 1100 ml  Net -266.58 ml   Filed Weights   09/12/18 0540 09/12/18 1500 09/15/18 0430  Weight: 91.9 kg 70.6 kg 96.3 kg    Examination:  General: A/O x4, positive  acute respiratory distress Eyes: negative scleral hemorrhage, negative anisocoria, negative icterus ENT: Negative Runny nose, negative gingival bleeding, Neck:  Negative scars, masses, torticollis, lymphadenopathy, JVD Lungs: Diffuse decreased breath sounds, diffuse expiratory wheeze,  Cardiovascular: Regular rate and rhythm without murmur gallop or rub normal S1 and S2 Abdomen: negative abdominal pain, nondistended, positive soft, bowel sounds, no rebound, no ascites, no appreciable mass Extremities: No significant cyanosis, clubbing, or edema bilateral lower extremities Skin: Negative rashes, lesions, ulcers Psychiatric:  Negative depression, negative anxiety, negative fatigue, negative mania  Central nervous system:  Cranial nerves II through XII intact, tongue/uvula midline, all extremities muscle strength 5/5, sensation intact throughout,  negative dysarthria, negative expressive aphasia, negative receptive aphasia.  .     Data Reviewed: Care during the described time interval was provided by me .  I have reviewed this patient's available data, including medical history, events of note, physical examination, and all test results as part of my evaluation.   CBC: Recent Labs  Lab 09/13/18 1113 09/14/18 0438 09/15/18 0415  WBC 15.5* 13.7* 12.8*  NEUTROABS  --  10.9* 9.6*  HGB 13.4 11.9* 11.2*  HCT 42.2 38.3 35.7*  MCV 88.7 87.2 87.3  PLT 202 252 271   Basic Metabolic Panel: Recent Labs  Lab  09/13/18 0436 09/14/18 0438 09/15/18 0415  NA 137 135 136  K 4.1 4.0 3.8  CL 100 98 100  CO2 27 27 29   GLUCOSE 136* 206* 104*  BUN 16 23* 24*  CREATININE 1.10* 0.99 0.94  CALCIUM 8.2* 8.2* 8.1*  MG 1.5* 1.7 2.2  PHOS  --   --  2.7   GFR: Estimated Creatinine Clearance: 70.3 mL/min (by C-G formula based on SCr of 0.94 mg/dL). Liver Function Tests: Recent Labs  Lab 09/13/18 0436 09/14/18 0438 09/15/18 0415  AST 30 17 15   ALT 15 13 12   ALKPHOS 95 88 96  BILITOT 1.2 0.8 0.6  PROT 5.5* 5.5* 5.4*  ALBUMIN 2.0* 1.7* 1.7*   No results for input(s): LIPASE, AMYLASE in the last 168 hours. No results for input(s): AMMONIA in the last 168 hours. Coagulation Profile: No results for input(s): INR, PROTIME in the last 168 hours. Cardiac Enzymes: Recent Labs  Lab 09/12/18 1202 09/12/18 1904 09/13/18 0436 09/14/18 0438 09/15/18 0415  CKTOTAL  --   --  133 48 30*  TROPONINI 14.91* 12.97*  --   --   --  BNP (last 3 results) No results for input(s): PROBNP in the last 8760 hours. HbA1C: No results for input(s): HGBA1C in the last 72 hours. CBG: Recent Labs  Lab 09/14/18 1621 09/14/18 1932 09/14/18 2354 09/15/18 0420 09/15/18 0749  GLUCAP 148* 128* 109* 92 205*   Lipid Profile: No results for input(s): CHOL, HDL, LDLCALC, TRIG, CHOLHDL, LDLDIRECT in the last 72 hours. Thyroid Function Tests: No results for input(s): TSH, T4TOTAL, FREET4, T3FREE, THYROIDAB in the last 72 hours. Anemia Panel: No results for input(s): VITAMINB12, FOLATE, FERRITIN, TIBC, IRON, RETICCTPCT in the last 72 hours. Urine analysis:    Component Value Date/Time   COLORURINE YELLOW 10/30/2016 1113   APPEARANCEUR CLOUDY (A) 10/30/2016 1113   LABSPEC 1.024 10/30/2016 1113   PHURINE 8.0 10/30/2016 1113   GLUCOSEU >=500 (A) 10/30/2016 1113   HGBUR NEGATIVE 10/30/2016 1113   BILIRUBINUR NEGATIVE 10/30/2016 1113   KETONESUR 20 (A) 10/30/2016 1113   PROTEINUR >=300 (A) 10/30/2016 1113    UROBILINOGEN 1.0 12/16/2011 1015   NITRITE NEGATIVE 10/30/2016 1113   LEUKOCYTESUR NEGATIVE 10/30/2016 1113   Sepsis Labs: (procalcitonin:4,lacticidven:4)  ) Recent Results (from the past 240 hour(s))  MRSA PCR Screening     Status: None   Collection Time: 09/12/18  5:48 AM  Result Value Ref Range Status   MRSA by PCR NEGATIVE NEGATIVE Final    Comment:        The GeneXpert MRSA Assay (FDA approved for NASAL specimens only), is one component of a comprehensive MRSA colonization surveillance program. It is not intended to diagnose MRSA infection nor to guide or monitor treatment for MRSA infections. Performed at [facility], [address] [place], [place] [address]   Novel Coronavirus, NAA (hospital order; send-out to ref lab)     Status: None   Collection Time: 09/12/18  8:33 AM  Result Value Ref Range Status   SARS-CoV-2, NAA NOT DETECTED NOT DETECTED Final    Comment: Negative (Not Detected) results do not exclude infection caused by SARS CoV 2 and should not be used as the sole basis for treatment or other patient management decisions. Optimum specimen types and timing for peak viral levels during infections caused  by SARS CoV 2 have not been determined. Collection of multiple specimens (types and time points) from the same patient may be necessary to detect the virus. Improper specimen collection and handling, sequence variability underlying assay primers and or probes, or the presence of organisms in  quantities less than the limit of detection of the assay may lead to false negative results. Positive and negative predictive values of testing are highly dependent on prevalence. False negative results are more likely when prevalence of disease is high. (NOTE) The expected result is Negative (Not Detected). The SARS CoV 2 test is intended for the presumptive qualitative  detection of nucleic acid from SARS CoV 2 in upper and lower  respir atory specimens.  Testing methodology is real time RT PCR. Test results must be correlated with clinical presentation and  evaluated in the context of other laboratory and epidemiologic data.  Test performance can be affected because the epidemiology and  clinical spectrum of infection caused by SARS CoV 2 is not fully  known. For example, the optimum types of specimens to collect and  when during the course of infection these specimens are most likely  to contain detectable viral RNA may not be known. This test has not been Food and Drug Administration (FDA) cleared or  approved  and has been authorized by FDA under an Emergency Use  Authorization (EUA). The test is only authorized for the duration of  the declaration that circumstances exist justifying the authorization  of emergency use of in vitro diagnostic tests for detection and or  diagnosis of SARS CoV 2 under Section 564(b)(1) of the Act, 21 U.S.C.  section 8503685311 3(b)(1), unless the authorization is terminated or   revoked sooner. [facility] is certified under the  Clinical Laboratory Improvement Amendments of 1988 (CLIA), 42 U.S.C.  section [phone], to perform high complexity tests. Performed at [facility] CLIA [id number] [address], [place], [place] [address] Laboratory Director: Turner Daniels, MD    Coronavirus Source NASOPHARYNGEAL  Final    Comment: Performed at [facility], [address] [place], [place] [address]         Radiology Studies: Dg Chest Port 1 View  Result Date: 09/15/2018 CLINICAL DATA:  Shortness of breath and cough EXAM: PORTABLE CHEST 1 VIEW COMPARISON:  09/12/2018 FINDINGS: Cardiac shadow is within normal limits. Lungs are well aerated bilaterally. Diffuse patchy opacities are noted bilaterally increased in the interval from the prior exam consistent with CHF. Underlying atelectasis/early infiltrate is noted in the bases bilaterally. No pneumothorax  is seen. The bony structures are within normal limits. Postsurgical changes are again noted. IMPRESSION: Changes consistent with CHF with superimposed bibasilar atelectasis/early infiltrates. Electronically Signed   By: Alcide Clever M.D.   On: 09/15/2018 07:42        Scheduled Meds: . aspirin EC  81 mg Oral Daily  . atorvastatin  80 mg Oral q1800  . azithromycin  500 mg Oral Daily  . Chlorhexidine Gluconate Cloth  6 each Topical Daily  . DULoxetine  60 mg Oral BID  . enoxaparin (LOVENOX) injection  1 mg/kg Subcutaneous Q12H  . furosemide  40 mg Intravenous Daily  . gabapentin  400 mg Oral TID  . hydrALAZINE  25 mg Oral BID  . insulin aspart  0-20 Units Subcutaneous Q4H  . insulin glargine  35 Units Subcutaneous Daily  . isosorbide dinitrate  10 mg Oral BID  . metoprolol tartrate  25 mg Oral BID  . nicotine  21 mg Transdermal Daily  . sodium chloride flush  3 mL Intravenous Q12H  . spironolactone  12.5 mg Oral Daily   Continuous Infusions: . sodium chloride 10 mL/hr at 09/14/18 1300  . cefTRIAXone (ROCEPHIN)  IV 1 g (09/15/18 0830)     LOS: 3 days    Time spent: 40 minutes    Jourdyn Hasler, Roselind Messier, MD [facility] Pager [phone]   If 7PM-7AM, please contact night-coverage [email or web address] Password [facility] 09/15/2018, 8:53 AM

## 2018-09-15 NOTE — Progress Notes (Signed)
ANTICOAGULATION CONSULT NOTE   Pharmacy Consult for lovenox Indication: chest pain/ACS  Allergies  Allergen Reactions  . Codeine Hives  . Jardiance [Empagliflozin] Other (See Comments)    Makes blood sugar drop  . Sulfa Antibiotics Swelling  . Tramadol Hives    Patient Measurements: Height: 5\' 3"  (160 cm) Weight: 212 lb 4.8 oz (96.3 kg) IBW/kg (Calculated) : 52.4 Heparin Dosing Weight: 72kg  Vital Signs: Temp: 98.3 F (36.8 C) (04/02 0430) Temp Source: Oral (04/02 0430) BP: 145/62 (04/02 0831) Pulse Rate: 77 (04/02 0836)  Labs: Recent Labs    09/12/18 1202 09/12/18 1904 09/13/18 0436  09/13/18 1113 09/13/18 1452 09/14/18 0438 09/15/18 0415  HGB  --   --   --    < > 13.4  --  11.9* 11.2*  HCT  --   --   --   --  42.2  --  38.3 35.7*  PLT  --   --   --   --  202  --  252 271  HEPARINUNFRC  --  <0.10* <0.10*  --   --  <0.10*  --   --   CREATININE  --   --  1.10*  --   --   --  0.99 0.94  CKTOTAL  --   --  133  --   --   --  48 30*  TROPONINI 14.91* 12.97*  --   --   --   --   --   --    < > = values in this interval not displayed.    Estimated Creatinine Clearance: 70.3 mL/min (by C-G formula based on SCr of 0.94 mg/dL).   Medical History: Past Medical History:  Diagnosis Date  . Anxiety   . CAD, multiple vessel 03/2011   Status post CABG x4 - LIMA-LAD, SVG-DIAG, SVG-OM, SVG-dRCA.([facility])  . Chronic diastolic CHF (congestive heart failure), NYHA class 2 (HCC)    With mild systolic dysfunction by last echo  . Depression   . Diabetes mellitus   . Frequency   . GERD (gastroesophageal reflux disease)   . Herniated disc   . Hyperlipidemia   . Myocardial infarction (HCC) 10/12   OV Dr Tomie China  12/22/11 with clearance note on chart , EKG  11/12 on chart  . Paroxysmal atrial fibrillation (HCC)    Documented during COPD exacerbation hospitalization in 2018    Assessment: 62 yoF admitted from [facility] for possible COVID-19 (result was  negative) and ACS r/o. She is noted with NSTEMI on lovenox with plans for cath on 4/3. -Hg= 11.2, plt= 271 -wt= 96.3kg (wt of 70.7 kg on 3/30 was error)  Goal of Therapy:  Anti-Xa 0.6-1 Monitor platelets by anticoagulation protocol: Yes   Plan:  -Change lovenox to 90mg  sq q12h -Will follow plans post cath 4/3  Harland German, PharmD Clinical Pharmacist **Pharmacist phone directory can now be found on [email or web address] ([email or web address]).  Listed under [facility].

## 2018-09-15 NOTE — TOC Initial Note (Signed)
Transition of Care [facility]) - Initial/Assessment Note    Patient Details  Name: Mindy Love MRN: [id number] Date of Birth: 04-24-1956  Transition of Care [facility]) CM/SW Contact:    Gala Lewandowsky, RN Phone Number: 09/15/2018, 3:17 PM  Clinical Narrative: Pt presented for acute hypoxemic respiratory failure. PTA from home with husband, son, and grandchildren. Plan to return home once stable. Hx Right AKA with prosthesis, WC, 3n1 and RW. Pt states she would like [facility] RN for medication assistance because she verbalized having some vision problems in her left eye. RN can assist with disease management and Medication management. CM did see if patient can get [facility]- out of network with provider. Plan for cardiac cath 09-16-18. CM will see which agency can service the patient in [place]. Will continue to monitor.                  Expected Discharge Plan: Home w Home Health Services Barriers to Discharge: Continued Medical Work up(Cardiac Cath 09-16-18)   Patient Goals and CMS Choice Patient states their goals for this hospitalization and ongoing recovery are:: "to get home" CMS Medicare.gov Compare Post Acute Care list provided to:: Patient Choice offered to / list presented to : Patient  Expected Discharge Plan and Services Expected Discharge Plan: Home w Home Health Services In-house Referral: [facility] Discharge Planning Services: CM Consult Post Acute Care Choice: Home Health Living arrangements for the past 2 months: Single Family Home                 DME Arranged: N/A DME Agency: NA HH Arranged: RN, Disease Management(Patient expressed having visual problems in left eye- RN to assist with managing medications. )    Prior Living Arrangements/Services Living arrangements for the past 2 months: Single Family Home Lives with:: Adult Children, Spouse(grandchildren) Patient language and need for interpreter reviewed:: Yes Do you feel safe going back to the place where you live?:  Yes      Need for Family Participation in Patient Care: Yes (Comment) Care giver support system in place?: Yes (comment) Current home services: DME(Pt has WC, 3n1, RW, prosthesis) Criminal Activity/Legal Involvement Pertinent to Current Situation/Hospitalization: No - Comment as needed  Activities of Daily Living Home Assistive Devices/Equipment: Wheelchair, Environmental consultant (specify type), Prosthesis, Built-in shower seat, Bedside commode/3-in-1 ADL Screening (condition at time of admission) Patient's cognitive ability adequate to safely complete daily activities?: Yes Is the patient deaf or have difficulty hearing?: No Does the patient have difficulty seeing, even when wearing glasses/contacts?: Yes(Pt blind in left eye, 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) Does the patient have difficulty concentrating, remembering, or making decisions?: No Patient able to express need for assistance with ADLs?: Yes Does the patient have difficulty dressing or bathing?: No Independently performs ADLs?: Yes (appropriate for developmental age) Does the patient have difficulty walking or climbing stairs?: Yes Weakness of Legs: Both(Pt wheelchair bound, R AKA, L toes amputated) Weakness of Arms/Hands: Both  Permission Sought/Granted Permission sought to share information with : Case Manager Permission granted to share information with : No(Patient is A/o)              Emotional Assessment Appearance:: Appears stated age Attitude/Demeanor/Rapport: Engaged Affect (typically observed): Accepting Orientation: : Oriented to Situation, Oriented to Self, Oriented to Place, Oriented to  Time Alcohol / Substance Use: Not Applicable Psych Involvement: No (comment)  Admission diagnosis:  COVID-19 virus detected [U07.1] Respiratory difficulty [R06.03] COPD (chronic obstructive pulmonary disease) (HCC) [J44.9] Patient Active Problem List  Diagnosis Date Noted  . Acute hypoxemic respiratory failure (HCC) 09/12/2018  .  Acute pulmonary edema (HCC) 09/12/2018  . Tobacco abuse 09/12/2018  . Non-STEMI (non-ST elevated myocardial infarction) (HCC) 09/12/2018  . Acute decompensated heart failure (HCC) 09/12/2018  . Leukocytosis 09/12/2018  . Type 2 diabetes mellitus with vascular disease (HCC) 09/12/2018  . Stenosis of lumbosacral spine 06/26/2011   PCP:  [name]., MD Pharmacy:   [facility] - [address], [place] - [address] [place] [address] Phone: [phone] Fax: [phone]     Social Determinants of Health (SDOH) Interventions    Readmission Risk Interventions No flowsheet data found.

## 2018-09-15 NOTE — H&P (View-Only) (Signed)
Progress Note  Patient Name: Mindy Love Date of Encounter: 09/15/2018  Primary Cardiologist: No primary care provider on file. HP remotely  Subjective   Patient still short of breath, requiring O2.  Not currently having any chest pain, but abdomen is sore.  She says that is chronic.  Agreeable to cardiac catheterization tomorrow  Inpatient Medications    Scheduled Meds: . aspirin EC  81 mg Oral Daily  . atorvastatin  80 mg Oral q1800  . azithromycin  500 mg Oral Daily  . Chlorhexidine Gluconate Cloth  6 each Topical Daily  . DULoxetine  60 mg Oral BID  . enoxaparin (LOVENOX) injection  1 mg/kg Subcutaneous Q12H  . furosemide  40 mg Intravenous Daily  . gabapentin  400 mg Oral TID  . hydrALAZINE  25 mg Oral BID  . insulin aspart  0-20 Units Subcutaneous Q4H  . insulin glargine  35 Units Subcutaneous Daily  . isosorbide dinitrate  10 mg Oral BID  . metoprolol tartrate  25 mg Oral BID  . nicotine  21 mg Transdermal Daily  . sodium chloride flush  3 mL Intravenous Q12H  . spironolactone  12.5 mg Oral Daily   Continuous Infusions: . sodium chloride 10 mL/hr at 09/14/18 1300  . cefTRIAXone (ROCEPHIN)  IV 1 g (09/15/18 0830)   PRN Meds: sodium chloride, acetaminophen, metoprolol tartrate, morphine injection, ondansetron (ZOFRAN) IV   Vital Signs    Vitals:   09/14/18 2223 09/15/18 0430 09/15/18 0831 09/15/18 0836  BP: 138/64 122/62 (!) 145/62   Pulse: 68 70  77  Resp:  16    Temp: 98.2 F (36.8 C) 98.3 F (36.8 C)    TempSrc: Oral Oral    SpO2: 92% 94%    Weight:  96.3 kg    Height:        Intake/Output Summary (Last 24 hours) at 09/15/2018 0848 Last data filed at 09/15/2018 0836 Gross per 24 hour  Intake 833.42 ml  Output 1100 ml  Net -266.58 ml   Last 3 Weights 09/15/2018 09/12/2018 09/12/2018  Weight (lbs) 212 lb 4.8 oz 155 lb 10.3 oz 202 lb 9.6 oz  Weight (kg) 96.299 kg 70.6 kg 91.9 kg      Telemetry    Sinus rhythm, sinus tach at times, no PAT.-  Personally Reviewed  ECG    None today- Personally Reviewed  Physical Exam   General: Well developed, obese, female in no acute distress Head: Eyes PERRLA, No xanthomas.   Normocephalic and atraumatic Lungs: Bibasilar rales. Heart: HRRR S1 S2, without MRG.  Upper extremity pulses are 2+ & equal. JVD 9 cm. Abdomen: Bowel sounds are present, abdomen soft and diffusely tender without masses or  hernias noted. Msk: Normal strength and tone for age. Extremities: No clubbing, cyanosis or edema. S/p left forefoot amputation and right AKA Skin:  No rashes or lesions noted. Neuro: Alert and oriented X 3. Psych:  Peri Jefferson affect, responds appropriately   Labs    Chemistry Recent Labs  Lab 09/13/18 0436 09/14/18 0438 09/15/18 0415  NA 137 135 136  K 4.1 4.0 3.8  CL 100 98 100  CO2 27 27 29   GLUCOSE 136* 206* 104*  BUN 16 23* 24*  CREATININE 1.10* 0.99 0.94  CALCIUM 8.2* 8.2* 8.1*  PROT 5.5* 5.5* 5.4*  ALBUMIN 2.0* 1.7* 1.7*  AST 30 17 15   ALT 15 13 12   ALKPHOS 95 88 96  BILITOT 1.2 0.8 0.6  GFRNONAA 55* >60 >60  GFRAA >60 >60 >60  ANIONGAP 10 10 7     Hematology Recent Labs  Lab 09/13/18 1113 09/14/18 0438 09/15/18 0415  WBC 15.5* 13.7* 12.8*  RBC 4.76 4.39 4.09  HGB 13.4 11.9* 11.2*  HCT 42.2 38.3 35.7*  MCV 88.7 87.2 87.3  MCH 28.2 27.1 27.4  MCHC 31.8 31.1 31.4  RDW 15.6* 15.2 15.1  PLT 202 252 271    Cardiac Enzymes Recent Labs  Lab 09/12/18 1202 09/12/18 1904  TROPONINI 14.91* 12.97*    Radiology    Dg Chest Port 1 View  Result Date: 09/15/2018 CLINICAL DATA:  Shortness of breath and cough EXAM: PORTABLE CHEST 1 VIEW COMPARISON:  09/12/2018 FINDINGS: Cardiac shadow is within normal limits. Lungs are well aerated bilaterally. Diffuse patchy opacities are noted bilaterally increased in the interval from the prior exam consistent with CHF. Underlying atelectasis/early infiltrate is noted in the bases bilaterally. No pneumothorax is seen. The bony  structures are within normal limits. Postsurgical changes are again noted. IMPRESSION: Changes consistent with CHF with superimposed bibasilar atelectasis/early infiltrates. Electronically Signed   By: Mark  Lukens M.D.   On: 09/15/2018 07:42    Cardiac Studies   TTE: 09/12/18  IMPRESSIONS   1. The left ventricle has severely reduced systolic function, with an ejection fraction of 25-30%. The cavity size was moderately dilated. There is mildly increased left ventricular wall thickness. Left ventricular diastolic Doppler parameters are  consistent with pseudonormalization. Elevated left ventricular end-diastolic pressure.  2. The right ventricle has normal systolic function. The cavity was normal. There is no increase in right ventricular wall thickness.  3. Left atrial size was moderately dilated.  4. The mitral valve is degenerative. Moderate thickening of the mitral valve leaflet. Moderate calcification of the mitral valve leaflet. There is moderate mitral annular calcification present.  5. The aortic valve is tricuspid. Severely thickening of the aortic valve. Sclerosis without any evidence of stenosis of the aortic valve. Aortic valve regurgitation is mild by color flow Doppler.  Patient Profile     63 y.o. 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 fib, PAD s/p right AKA, IDDM, GERD, HL and depression who presented with acute CHF and elevated troponins.   Assessment & Plan    1. NSTEMI versus demand ischemia from myocarditis:  -Peak troponin XIV 0.91 - EF nearly 25%, but no RWMAs noted -CABG in 2012 was LIMA-LAD, SVG-diag-OM, SVG-RCA -Heparin started 3-30 and converted to Lovenox on 3-31, MD to advise if we continue this until her cath or not -Change short acting Lopressor to Toprol-XL 50 mg daily - Continue BiDil and Spironolactone -We will tentatively schedule her for right/left heart catheterization tomorrow, on for 2nd case, Dr Jordan.    2. Acute Respiratory failure with hypoxia:  - Per IM, patient transferred from ICU on 4/1 - Still requiring 6 L O2 by cannula -We will recheck chest x-ray in a.m. and decide if patient respiratory status is good enough for cath  3.  Paroxysmal A. fib, PAT:  - Brief episodes of tachycardia associated with coughing have improved since being started on a beta-blocker -Continue to follow on telemetry, MD advise if event monitor needed after discharge  4.  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 Would wait until after cath to add ARB or Entresto, her BUN is climbing some with diuresis  5. IDDM:  Management per IM    Theodore Demark, PA-C  09/15/2018, 8:48 AM     ATTENDING ATTESTATION  I have seen, examined and evaluated the patient this PM along with Theodore Demark, PA-C.  After reviewing all the available data and chart, we discussed the patients laboratory, study & physical findings as well as symptoms in detail. I agree with her findings, examination as well as impression recommendations as per our discussion.    Slow recovery from what seems to be PNA & potentially NSTEMI with new drop in EF on Echo (Cardiomyopathy) - ? Ischemic vs. Stress induced.  Has known CAD-CABG.   With ongoing ? Re Ischemic or Non-ischemic CM & NSTEMI vs. Demand Ischemia, will plan R&LHC tomorrow -- hopefully will help determine diuretic requirement (BUN going up - hold AM dose of Furosemide).  Will need to convert Metoprolol to Toprol &  post-cath, will need to consider changing from Isordil/Hydral to [facility] prior to d/c. Spironolactone started yesterday.      Bryan Lemma, M.D., M.S. Interventional Cardiologist   Pager # [phone] Phone # [phone] [address] [place], [place] [address]   For questions or updates, please contact [facility] Please consult [email or web address] for contact info under

## 2018-09-15 NOTE — Consult Note (Signed)
   [facility] Inpatient Consult   09/15/2018  Mindy Love 1955/11/20 [id number]  Thank you for this consult.  Patient evaluated for [facility] Care Management services.  Patient is not currently a beneficiary of the attributed ACO Registry in the [employer]  Her Primary Care Provider is not a [facility] primary care provider or is not [facility] affiliated and not in a [facility] county.   This patient is Not eligible for [facility] Care Management Services.   Reason:  Not a beneficiary currently attributed to one of the [facility] ACO Registry populations.  Membership roster was used to verify non- eligible status.   Charlesetta Shanks, RN BSN CCM [facility]  [phone] business mobile phone Toll free office [phone]

## 2018-09-15 NOTE — Care Management Important Message (Signed)
Important Message  Patient Details  Name: Mindy Love MRN: [id number] Date of Birth: 1956/01/28   Medicare Important Message Given:  Yes    Dorena Bodo 09/15/2018, 3:34 PM

## 2018-09-16 ENCOUNTER — Inpatient Hospital Stay (HOSPITAL_COMMUNITY): Payer: Medicare HMO

## 2018-09-16 ENCOUNTER — Encounter (HOSPITAL_COMMUNITY)
Admission: AD | Disposition: A | Discharge status: Home Health | Payer: Self-pay | Source: Other Acute Inpatient Hospital | Attending: Internal Medicine

## 2018-09-16 DIAGNOSIS — I5023 Acute on chronic systolic (congestive) heart failure: Secondary | ICD-10-CM

## 2018-09-16 DIAGNOSIS — R0789 Other chest pain: Secondary | ICD-10-CM

## 2018-09-16 DIAGNOSIS — I2510 Atherosclerotic heart disease of native coronary artery without angina pectoris: Secondary | ICD-10-CM

## 2018-09-16 DIAGNOSIS — I248 Other forms of acute ischemic heart disease: Secondary | ICD-10-CM | POA: Diagnosis present

## 2018-09-16 DIAGNOSIS — I428 Other cardiomyopathies: Secondary | ICD-10-CM

## 2018-09-16 HISTORY — PX: RIGHT/LEFT HEART CATH AND CORONARY/GRAFT ANGIOGRAPHY: CATH118267

## 2018-09-16 LAB — POCT I-STAT EG7
Acid-Base Excess: 8 mmol/L — ABNORMAL HIGH (ref 0.0–2.0)
Acid-Base Excess: 8 mmol/L — ABNORMAL HIGH (ref 0.0–2.0)
Bicarbonate: 33 mmol/L — ABNORMAL HIGH (ref 20.0–28.0)
Bicarbonate: 34 mmol/L — ABNORMAL HIGH (ref 20.0–28.0)
Calcium, Ion: 1.14 mmol/L — ABNORMAL LOW (ref 1.15–1.40)
Calcium, Ion: 1.18 mmol/L (ref 1.15–1.40)
HCT: 36 % (ref 36.0–46.0)
HCT: 36 % (ref 36.0–46.0)
Hemoglobin: 12.2 g/dL (ref 12.0–15.0)
Hemoglobin: 12.2 g/dL (ref 12.0–15.0)
O2 Saturation: 69 %
O2 Saturation: 93 %
Potassium: 4 mmol/L (ref 3.5–5.1)
Potassium: 4.1 mmol/L (ref 3.5–5.1)
Sodium: 138 mmol/L (ref 135–145)
Sodium: 138 mmol/L (ref 135–145)
TCO2: 34 mmol/L — ABNORMAL HIGH (ref 22–32)
TCO2: 36 mmol/L — ABNORMAL HIGH (ref 22–32)
pCO2, Ven: 47.5 mmHg (ref 44.0–60.0)
pCO2, Ven: 51.4 mmHg (ref 44.0–60.0)
pH, Ven: 7.429 (ref 7.250–7.430)
pH, Ven: 7.45 — ABNORMAL HIGH (ref 7.250–7.430)
pO2, Ven: 36 mmHg (ref 32.0–45.0)
pO2, Ven: 66 mmHg — ABNORMAL HIGH (ref 32.0–45.0)

## 2018-09-16 LAB — CBC WITH DIFFERENTIAL/PLATELET
Abs Immature Granulocytes: 0.05 10*3/uL (ref 0.00–0.07)
Basophils Absolute: 0 10*3/uL (ref 0.0–0.1)
Basophils Relative: 0 %
Eosinophils Absolute: 0 10*3/uL (ref 0.0–0.5)
Eosinophils Relative: 0 %
HCT: 37.3 % (ref 36.0–46.0)
Hemoglobin: 12 g/dL (ref 12.0–15.0)
Immature Granulocytes: 1 %
Lymphocytes Relative: 7 %
Lymphs Abs: 0.6 10*3/uL — ABNORMAL LOW (ref 0.7–4.0)
MCH: 27.6 pg (ref 26.0–34.0)
MCHC: 32.2 g/dL (ref 30.0–36.0)
MCV: 85.9 fL (ref 80.0–100.0)
Monocytes Absolute: 0.1 10*3/uL (ref 0.1–1.0)
Monocytes Relative: 1 %
Neutro Abs: 8.2 10*3/uL — ABNORMAL HIGH (ref 1.7–7.7)
Neutrophils Relative %: 91 %
Platelets: 291 10*3/uL (ref 150–400)
RBC: 4.34 MIL/uL (ref 3.87–5.11)
RDW: 14.6 % (ref 11.5–15.5)
WBC: 9 10*3/uL (ref 4.0–10.5)
nRBC: 0 % (ref 0.0–0.2)

## 2018-09-16 LAB — COMPREHENSIVE METABOLIC PANEL
ALT: 13 U/L (ref 0–44)
AST: 15 U/L (ref 15–41)
Albumin: 1.7 g/dL — ABNORMAL LOW (ref 3.5–5.0)
Alkaline Phosphatase: 110 U/L (ref 38–126)
Anion gap: 9 (ref 5–15)
BUN: 31 mg/dL — ABNORMAL HIGH (ref 6–20)
CO2: 27 mmol/L (ref 22–32)
Calcium: 8.5 mg/dL — ABNORMAL LOW (ref 8.9–10.3)
Chloride: 97 mmol/L — ABNORMAL LOW (ref 98–111)
Creatinine, Ser: 0.98 mg/dL (ref 0.44–1.00)
GFR calc Af Amer: >60 mL/min (ref >60)
GFR calc non Af Amer: >60 mL/min (ref >60)
Glucose, Bld: 249 mg/dL — ABNORMAL HIGH (ref 70–99)
Potassium: 4.3 mmol/L (ref 3.5–5.1)
Sodium: 133 mmol/L — ABNORMAL LOW (ref 135–145)
Total Bilirubin: 0.5 mg/dL (ref 0.3–1.2)
Total Protein: 5.8 g/dL — ABNORMAL LOW (ref 6.5–8.1)

## 2018-09-16 LAB — CBC
HCT: 41.6 % (ref 36.0–46.0)
Hemoglobin: 12.9 g/dL (ref 12.0–15.0)
MCH: 26.9 pg (ref 26.0–34.0)
MCHC: 31 g/dL (ref 30.0–36.0)
MCV: 86.8 fL (ref 80.0–100.0)
Platelets: 319 10*3/uL (ref 150–400)
RBC: 4.79 MIL/uL (ref 3.87–5.11)
RDW: 14.7 % (ref 11.5–15.5)
WBC: 13.2 10*3/uL — ABNORMAL HIGH (ref 4.0–10.5)
nRBC: 0 % (ref 0.0–0.2)

## 2018-09-16 LAB — GLUCOSE, CAPILLARY
Glucose-Capillary: 136 mg/dL — ABNORMAL HIGH (ref 70–99)
Glucose-Capillary: 153 mg/dL — ABNORMAL HIGH (ref 70–99)
Glucose-Capillary: 191 mg/dL — ABNORMAL HIGH (ref 70–99)
Glucose-Capillary: 228 mg/dL — ABNORMAL HIGH (ref 70–99)
Glucose-Capillary: 367 mg/dL — ABNORMAL HIGH (ref 70–99)
Glucose-Capillary: 74 mg/dL (ref 70–99)

## 2018-09-16 LAB — CREATININE, SERUM
Creatinine, Ser: 1.06 mg/dL — ABNORMAL HIGH (ref 0.44–1.00)
GFR calc Af Amer: >60 mL/min (ref >60)
GFR calc non Af Amer: 57 mL/min — ABNORMAL LOW (ref >60)

## 2018-09-16 LAB — POCT ACTIVATED CLOTTING TIME
Activated Clotting Time: 109 seconds
Activated Clotting Time: 142 seconds

## 2018-09-16 LAB — MAGNESIUM: Magnesium: 2.2 mg/dL (ref 1.7–2.4)

## 2018-09-16 LAB — CK: Total CK: 24 U/L — ABNORMAL LOW (ref 38–234)

## 2018-09-16 SURGERY — RIGHT/LEFT HEART CATH AND CORONARY/GRAFT ANGIOGRAPHY
Anesthesia: LOCAL

## 2018-09-16 MED ORDER — SODIUM CHLORIDE 0.9 % WEIGHT BASED INFUSION
1.0000 mL/kg/h | INTRAVENOUS | Status: AC
  Administered 2018-09-16: 13:00:00 1 mL/kg/h via INTRAVENOUS

## 2018-09-16 MED ORDER — ENOXAPARIN SODIUM 40 MG/0.4ML ~~LOC~~ SOLN
40.0000 mg | SUBCUTANEOUS | Status: DC
  Administered 2018-09-17 – 2018-09-19 (×3): 40 mg via SUBCUTANEOUS
  Filled 2018-09-16 (×3): qty 0.4

## 2018-09-16 MED ORDER — SODIUM CHLORIDE 0.9 % IV SOLN: 250.0000 mL | INTRAVENOUS | Status: DC | PRN

## 2018-09-16 MED ORDER — LIDOCAINE HCL (PF) 1 % IJ SOLN
INTRAMUSCULAR | Status: AC
  Filled 2018-09-16: qty 30

## 2018-09-16 MED ORDER — IPRATROPIUM-ALBUTEROL 0.5-2.5 (3) MG/3ML IN SOLN
3.0000 mL | Freq: Three times a day (TID) | RESPIRATORY_TRACT | Status: DC
  Administered 2018-09-17: 08:00:00 3 mL via RESPIRATORY_TRACT
  Filled 2018-09-16: qty 3

## 2018-09-16 MED ORDER — IOHEXOL 350 MG/ML SOLN
INTRAVENOUS | Status: DC | PRN
  Administered 2018-09-16: 11:00:00 115 mL via INTRA_ARTERIAL

## 2018-09-16 MED ORDER — FENTANYL CITRATE (PF) 100 MCG/2ML IJ SOLN
INTRAMUSCULAR | Status: AC
  Filled 2018-09-16: qty 2

## 2018-09-16 MED ORDER — HEPARIN (PORCINE) IN NACL 1000-0.9 UT/500ML-% IV SOLN
INTRAVENOUS | Status: AC
  Filled 2018-09-16: qty 1000

## 2018-09-16 MED ORDER — METOPROLOL SUCCINATE ER 50 MG PO TB24: 50.0000 mg | ORAL_TABLET | Freq: Every day | ORAL | Status: DC

## 2018-09-16 MED ORDER — IPRATROPIUM-ALBUTEROL 0.5-2.5 (3) MG/3ML IN SOLN: 3.0000 mL | Freq: Four times a day (QID) | RESPIRATORY_TRACT | Status: DC

## 2018-09-16 MED ORDER — METOPROLOL SUCCINATE ER 50 MG PO TB24
50.0000 mg | ORAL_TABLET | Freq: Every day | ORAL | Status: DC
  Administered 2018-09-16 – 2018-09-19 (×4): 50 mg via ORAL
  Filled 2018-09-16 (×4): qty 1

## 2018-09-16 MED ORDER — HEPARIN (PORCINE) IN NACL 1000-0.9 UT/500ML-% IV SOLN
INTRAVENOUS | Status: DC | PRN
  Administered 2018-09-16: 500 mL

## 2018-09-16 MED ORDER — LIDOCAINE HCL (PF) 1 % IJ SOLN
INTRAMUSCULAR | Status: DC | PRN
  Administered 2018-09-16: 15 mL

## 2018-09-16 MED ORDER — MIDAZOLAM HCL 2 MG/2ML IJ SOLN
INTRAMUSCULAR | Status: DC | PRN
  Administered 2018-09-16: 1 mg via INTRAVENOUS

## 2018-09-16 MED ORDER — MIDAZOLAM HCL 2 MG/2ML IJ SOLN
INTRAMUSCULAR | Status: AC
  Filled 2018-09-16: qty 2

## 2018-09-16 MED ORDER — SODIUM CHLORIDE 0.9% FLUSH: 3.0000 mL | INTRAVENOUS | Status: DC | PRN

## 2018-09-16 MED ORDER — IPRATROPIUM-ALBUTEROL 0.5-2.5 (3) MG/3ML IN SOLN: 3.0000 mL | Freq: Four times a day (QID) | RESPIRATORY_TRACT | Status: DC | PRN

## 2018-09-16 MED ORDER — FENTANYL CITRATE (PF) 100 MCG/2ML IJ SOLN
INTRAMUSCULAR | Status: DC | PRN
  Administered 2018-09-16: 25 ug via INTRAVENOUS

## 2018-09-16 MED ORDER — ALBUTEROL SULFATE (2.5 MG/3ML) 0.083% IN NEBU: 2.5000 mg | INHALATION_SOLUTION | Freq: Four times a day (QID) | RESPIRATORY_TRACT | Status: DC | PRN

## 2018-09-16 MED ORDER — FUROSEMIDE 40 MG PO TABS
40.0000 mg | ORAL_TABLET | Freq: Every day | ORAL | Status: DC
  Administered 2018-09-17: 40 mg via ORAL
  Filled 2018-09-16: qty 1

## 2018-09-16 MED ORDER — SODIUM CHLORIDE 0.9% FLUSH
3.0000 mL | Freq: Two times a day (BID) | INTRAVENOUS | Status: DC
  Administered 2018-09-16 – 2018-09-18 (×4): 3 mL via INTRAVENOUS

## 2018-09-16 SURGICAL SUPPLY — 11 items
CATH INFINITI 5FR MULTPACK ANG (CATHETERS) ×1 IMPLANT
CATH SWAN GANZ 7F STRAIGHT (CATHETERS) ×1 IMPLANT
KIT HEART LEFT (KITS) ×2 IMPLANT
PACK CARDIAC CATHETERIZATION (CUSTOM PROCEDURE TRAY) ×2 IMPLANT
SHEATH PINNACLE 5F 10CM (SHEATH) ×1 IMPLANT
SHEATH PINNACLE 7F 10CM (SHEATH) ×1 IMPLANT
SHEATH PROBE COVER 6X72 (BAG) ×1 IMPLANT
SYR MEDRAD MARK 7 150ML (SYRINGE) ×2 IMPLANT
TRANSDUCER W/STOPCOCK (MISCELLANEOUS) ×2 IMPLANT
TUBING CIL FLEX 10 FLL-RA (TUBING) ×2 IMPLANT
WIRE EMERALD 3MM-J .035X150CM (WIRE) ×1 IMPLANT

## 2018-09-16 NOTE — Progress Notes (Addendum)
PROGRESS NOTE    Mindy Love  RUE:[id number] DOB: 1955-12-21 DOA: 09/12/2018 PCP: Guadalupe MapleGage, [name], MD   Brief Narrative:  63 y.o. obese BF PMHx anxiety, depression, diabetes type 2 uncontrolled with complication, MI, CAD, HLD, CAD, tobacco abuse  Transferred [facility] the [facility] Department from [facility] for higher level of care.  [facility] with complaints of fever and shortness of breath.  She was deemed to need COVID-19 ruled out.  She was originally slated to be [facility]; however, in the emergency department at [facility], her troponin went from 0.2 to 9+, prompting potential need for cardiology evaluation.  Consequently, she was transported to [facility].  Currently, she reports chest pain 8/10, refractory to sublingual NTG x 3 doses. Tightness in the back. Pleuritic pain under the R breast and paroxysmal coughing.   In the emergency department at [facility], she was observed to have SpO2 in the 70s.  Chest x-ray was obtained (report provided, images not included) with report of findings of pulmonary edema.  No fever at present but apparently had a fever of 102 (per nursing notes).  No known sick contacts.      Subjective:  4/3 A/O x4, positive S OB (improved), positive reproducible CP/back pain exacerbated by cough.    Assessment & Plan:   Principal Problem:   Acute hypoxemic respiratory failure (HCC) Active Problems:   Acute pulmonary edema (HCC)   Tobacco abuse   Non-STEMI (non-ST elevated myocardial infarction) (HCC)   Acute decompensated heart failure (HCC)   Leukocytosis   Type 2 diabetes mellitus with vascular disease (HCC)  Acute respiratory failure with hypoxia - Titrate O2 to maintain SPO2 89 to 92% -Complete 7-day course antibiotics - DuoNeb QID - Pulmonary toilet - Solu-Medrol 60 mg daily  CAP  -See acute respiratory failure  SARS/C coronavirus - Some  concern for patient having coronavirus but patient negative for coronavirus.  Tobacco abuse -Patient counseled extensively on risk factor of continued to smoke especially in pandemic.  Patient understands to continue to do so puts her at high risk of death. - Nicotine patch  Systolic and Diastolic CHF/Dilated cardiomyopathy - Strict in and out -1.4 L -Daily weight Filed Weights   09/12/18 1500 09/15/18 0430 09/16/18 0432  Weight: 70.6 kg 96.3 kg 97.7 kg  -Per cardiology's note close cardiac cath secondary to demand ischemia/myocarditis. -ASA 81 mg daily - Lasix 40 mg daily - Hydralazine 25 mg twice daily -Isosorbide dinitrate 10 mg twice daily -Toprol XL 50 mg daily -Spironolactone 12.5 mg daily  Paroxysmal A. Fib vs PAT See CHF  Diabetes type 2 uncontrolled with complications -  Hemoglobin A1c pending -Lipid panel pending -Lantus 35 units daily - Resistant SSI    DVT prophylaxis: Full dose Lovenox Code Status: Full Family Communication: None Disposition Plan: Per cardiology   Consultants:  Cardiology Dr. Bryan Lemmaavid Harding    Procedures/Significant Events:  3/30 echocardiogram: LVEF =25-30%.-  Elevated left ventricular end-diastolic pressure -Left Atrium: moderately dilated 4/3 LEFT/RIGHT catheterization:- LVEF 35 -40% with global hypokinesis   I have personally reviewed and interpreted all radiology studies and my findings are as above.  VENTILATOR SETTINGS:    Cultures 3/30 coronavirus negative 3/30 MRSA by PCR negative   Antimicrobials: Anti-infectives (From admission, onward)   Start     Ordered Stop   09/15/18 1000  azithromycin (ZITHROMAX) tablet 500 mg     09/14/18 1020 09/17/18 0959  09/12/18 0800  cefTRIAXone (ROCEPHIN) 1 g in sodium chloride 0.9 % 100 mL IVPB     09/12/18 0758 09/19/18 0759   09/12/18 0800  azithromycin (ZITHROMAX) 500 mg in sodium chloride 0.9 % 250 mL IVPB  Status:  Discontinued     09/12/18 0758 09/14/18 1020        Devices None   LINES / TUBES:  None    Continuous Infusions: . sodium chloride 10 mL/hr at 09/14/18 1300  . sodium chloride    . sodium chloride 30 mL/hr at 09/16/18 0602  . cefTRIAXone (ROCEPHIN)  IV 1 g (09/16/18 0813)     Objective: Vitals:   09/15/18 2106 09/15/18 2218 09/16/18 0128 09/16/18 0432  BP: 138/79   128/63  Pulse: 73 73  72  Resp: 18   18  Temp: 98.5 F (36.9 C)   98.1 F (36.7 C)  TempSrc: Oral   Oral  SpO2: 93%  93% 93%  Weight:    97.7 kg  Height:        Intake/Output Summary (Last 24 hours) at 09/16/2018 0913 Last data filed at 09/16/2018 0900 Gross per 24 hour  Intake 818.05 ml  Output 1400 ml  Net -581.95 ml   Filed Weights   09/12/18 1500 09/15/18 0430 09/16/18 0432  Weight: 70.6 kg 96.3 kg 97.7 kg    General: A/O x4, positive acute respiratory distress but improving Eyes: negative scleral hemorrhage, negative anisocoria, negative icterus ENT: Negative Runny nose, negative gingival bleeding, Neck:  Negative scars, masses, torticollis, lymphadenopathy, JVD Lungs: Diffuse decreased breath sounds (poor air movement), negative wheezes or crackles Cardiovascular: Regular rate and rhythm with frequent PVC, without murmur gallop or rub normal S1 and S2 Abdomen: negative abdominal pain, nondistended, positive soft, bowel sounds, no rebound, no ascites, no appreciable mass Extremities: No significant cyanosis, clubbing, or edema bilateral lower extremities, left inguinal slight blood from cardiac cath site Skin: Negative rashes, lesions, ulcers Psychiatric:  Negative depression, negative anxiety, negative fatigue, negative mania  Central nervous system:  Cranial nerves II through XII intact, tongue/uvula midline, all extremities muscle strength 5/5, sensation intact throughout,  negative dysarthria, negative expressive aphasia, negative receptive aphasia.       Data Reviewed: Care during the described time interval was provided by me .  I have  reviewed this patient's available data, including medical history, events of note, physical examination, and all test results as part of my evaluation.   CBC: Recent Labs  Lab 09/13/18 1113 09/14/18 0438 09/15/18 0415 09/16/18 0313  WBC 15.5* 13.7* 12.8* 9.0  NEUTROABS  --  10.9* 9.6* 8.2*  HGB 13.4 11.9* 11.2* 12.0  HCT 42.2 38.3 35.7* 37.3  MCV 88.7 87.2 87.3 85.9  PLT 202 252 271 291   Basic Metabolic Panel: Recent Labs  Lab 09/13/18 0436 09/14/18 0438 09/15/18 0415 09/16/18 0313  NA 137 135 136 133*  K 4.1 4.0 3.8 4.3  CL 100 98 100 97*  CO2 27 27 29 27   GLUCOSE 136* 206* 104* 249*  BUN 16 23* 24* 31*  CREATININE 1.10* 0.99 0.94 0.98  CALCIUM 8.2* 8.2* 8.1* 8.5*  MG 1.5* 1.7 2.2 2.2  PHOS  --   --  2.7  --    GFR: Estimated Creatinine Clearance: 67.9 mL/min (by C-G formula based on SCr of 0.98 mg/dL). Liver Function Tests: Recent Labs  Lab 09/13/18 0436 09/14/18 0438 09/15/18 0415 09/16/18 0313  AST 30 17 15 15   ALT 15 13 12 13   ALKPHOS  95 88 96 110  BILITOT 1.2 0.8 0.6 0.5  PROT 5.5* 5.5* 5.4* 5.8*  ALBUMIN 2.0* 1.7* 1.7* 1.7*   No results for input(s): LIPASE, AMYLASE in the last 168 hours. No results for input(s): AMMONIA in the last 168 hours. Coagulation Profile: No results for input(s): INR, PROTIME in the last 168 hours. Cardiac Enzymes: Recent Labs  Lab 09/12/18 1202 09/12/18 1904 09/13/18 0436 09/14/18 0438 09/15/18 0415 09/16/18 0313  CKTOTAL  --   --  133 48 30* 24*  TROPONINI 14.91* 12.97*  --   --   --   --    BNP (last 3 results) No results for input(s): PROBNP in the last 8760 hours. HbA1C: No results for input(s): HGBA1C in the last 72 hours. CBG: Recent Labs  Lab 09/15/18 1632 09/15/18 2013 09/15/18 2359 09/16/18 0430 09/16/18 0743  GLUCAP 131* 221* 228* 191* 153*   Lipid Profile: No results for input(s): CHOL, HDL, LDLCALC, TRIG, CHOLHDL, LDLDIRECT in the last 72 hours. Thyroid Function Tests: No results for  input(s): TSH, T4TOTAL, FREET4, T3FREE, THYROIDAB in the last 72 hours. Anemia Panel: No results for input(s): VITAMINB12, FOLATE, FERRITIN, TIBC, IRON, RETICCTPCT in the last 72 hours. Urine analysis:    Component Value Date/Time   COLORURINE YELLOW 10/30/2016 1113   APPEARANCEUR CLOUDY (A) 10/30/2016 1113   LABSPEC 1.024 10/30/2016 1113   PHURINE 8.0 10/30/2016 1113   GLUCOSEU >=500 (A) 10/30/2016 1113   HGBUR NEGATIVE 10/30/2016 1113   BILIRUBINUR NEGATIVE 10/30/2016 1113   KETONESUR 20 (A) 10/30/2016 1113   PROTEINUR >=300 (A) 10/30/2016 1113   UROBILINOGEN 1.0 12/16/2011 1015   NITRITE NEGATIVE 10/30/2016 1113   LEUKOCYTESUR NEGATIVE 10/30/2016 1113   Sepsis Labs: @LABRCNTIP (procalcitonin:4,lacticidven:4)  ) Recent Results (from the past 240 hour(s))  MRSA PCR Screening     Status: None   Collection Time: 09/12/18  5:48 AM  Result Value Ref Range Status   MRSA by PCR NEGATIVE NEGATIVE Final    Comment:        The GeneXpert MRSA Assay (FDA approved for NASAL specimens only), is one component of a comprehensive MRSA colonization surveillance program. It is not intended to diagnose MRSA infection nor to guide or monitor treatment for MRSA infections. Performed at [facility], [address] [place], [place] [address]   Novel Coronavirus, NAA (hospital order; send-out to ref lab)     Status: None   Collection Time: 09/12/18  8:33 AM  Result Value Ref Range Status   SARS-CoV-2, NAA NOT DETECTED NOT DETECTED Final    Comment: Negative (Not Detected) results do not exclude infection caused by SARS CoV 2 and should not be used as the sole basis for treatment or other patient management decisions. Optimum specimen types and timing for peak viral levels during infections caused  by SARS CoV 2 have not been determined. Collection of multiple specimens (types and time points) from the same patient may be necessary to detect the virus. Improper specimen collection and  handling, sequence variability underlying assay primers and or probes, or the presence of organisms in  quantities less than the limit of detection of the assay may lead to false negative results. Positive and negative predictive values of testing are highly dependent on prevalence. False negative results are more likely when prevalence of disease is high. (NOTE) The expected result is Negative (Not Detected). The SARS CoV 2 test is intended for the presumptive qualitative  detection of nucleic acid from SARS CoV 2  in upper and lower  respir atory specimens. Testing methodology is real time RT PCR. Test results must be correlated with clinical presentation and  evaluated in the context of other laboratory and epidemiologic data.  Test performance can be affected because the epidemiology and  clinical spectrum of infection caused by SARS CoV 2 is not fully  known. For example, the optimum types of specimens to collect and  when during the course of infection these specimens are most likely  to contain detectable viral RNA may not be known. This test has not been Food and Drug Administration (FDA) cleared or  approved and has been authorized by FDA under an Emergency Use  Authorization (EUA). The test is only authorized for the duration of  the declaration that circumstances exist justifying the authorization  of emergency use of in vitro diagnostic tests for detection and or  diagnosis of SARS CoV 2 under Section 564(b)(1) of the Act, 21 U.S.C.  section [phone] 3(b)(1), unless the authorization is terminated or   revoked sooner. [facility] is certified under the  Clinical Laboratory Improvement Amendments of 1988 (CLIA), 42 U.S.C.  section [phone], to perform high complexity tests. Performed at [facility] CLIA [id number] [address], [place], [place] [address] Laboratory Director: Turner Daniels, MD    Coronavirus Source  NASOPHARYNGEAL  Final    Comment: Performed at [facility], [address] [place], [place] [address]         Radiology Studies: Dg Chest Port 1 View  Result Date: 09/16/2018 CLINICAL DATA:  CHF EXAM: PORTABLE CHEST 1 VIEW COMPARISON:  09/15/2018 FINDINGS: Prior CABG. Cardiomegaly. Bilateral interstitial and airspace opacities, likely edema/CHF, slightly improved since prior study. Improving effusions. No acute bony abnormality. IMPRESSION: Improving pulmonary edema and pleural effusions. Continued mild CHF. Electronically Signed   By: Charlett Nose M.D.   On: 09/16/2018 07:43   Dg Chest Port 1 View  Result Date: 09/15/2018 CLINICAL DATA:  Shortness of breath and cough EXAM: PORTABLE CHEST 1 VIEW COMPARISON:  09/12/2018 FINDINGS: Cardiac shadow is within normal limits. Lungs are well aerated bilaterally. Diffuse patchy opacities are noted bilaterally increased in the interval from the prior exam consistent with CHF. Underlying atelectasis/early infiltrate is noted in the bases bilaterally. No pneumothorax is seen. The bony structures are within normal limits. Postsurgical changes are again noted. IMPRESSION: Changes consistent with CHF with superimposed bibasilar atelectasis/early infiltrates. Electronically Signed   By: Alcide Clever M.D.   On: 09/15/2018 07:42        Scheduled Meds: . aspirin EC  81 mg Oral Daily  . atorvastatin  80 mg Oral q1800  . Chlorhexidine Gluconate Cloth  6 each Topical Daily  . DULoxetine  60 mg Oral BID  . enoxaparin (LOVENOX) injection  90 mg Subcutaneous Q12H  . furosemide  40 mg Intravenous Daily  . gabapentin  400 mg Oral TID  . hydrALAZINE  25 mg Oral BID  . insulin aspart  0-20 Units Subcutaneous Q4H  . insulin glargine  35 Units Subcutaneous Daily  . isosorbide dinitrate  10 mg Oral BID  . methylPREDNISolone (SOLU-MEDROL) injection  60 mg Intravenous Q24H  . metoprolol succinate  50 mg Oral Daily  . metoprolol tartrate  25 mg Oral BID  .  nicotine  21 mg Transdermal Daily  . sodium chloride flush  3 mL Intravenous Q12H  . sodium chloride flush  3 mL Intravenous Q12H  . spironolactone  12.5 mg Oral Daily   Continuous Infusions: . sodium chloride 10 mL/hr at 09/14/18 1300  . sodium chloride    . sodium chloride 30 mL/hr at 09/16/18 0602  . cefTRIAXone (ROCEPHIN)  IV 1 g (09/16/18 0813)     LOS: 4 days    Time spent: 40 minutes    , Roselind Messier, MD [facility] Pager [phone]   If 7PM-7AM, please contact night-coverage [email or web address] Password [facility] 09/16/2018, 9:13 AM

## 2018-09-16 NOTE — TOC Transition Note (Signed)
Transition of Care [facility]) - CM/SW Discharge Note   Patient Details  Name: Mindy Love MRN: [id number] Date of Birth: [birth year]  Transition of [facility] CM/SW Contact:  Gala Lewandowsky, RN Phone Number: 09/16/2018, 1:07 PM   Clinical Narrative: Patient post cath- plan for home once stable to transition. Pt will have HH RN Services via [facility]. Agency will call the patient with a scheduled visit time. No further needs from CM at this time.      Final next level of care: Home w Home Health Services Barriers to Discharge: Continued Medical Work up(Cardiac Cath 09-16-18)   Patient Goals and CMS Choice Patient states their goals for this hospitalization and ongoing recovery are:: "to get home" CMS Medicare.gov Compare Post Acute Care list provided to:: Patient Choice offered to / list presented to : Patient   Discharge Plan and Services In-house Referral: [facility] Discharge Planning Services: CM Consult Post Acute Care Choice: Home Health          DME Arranged: N/A DME Agency: NA HH Arranged: RN, Disease Management(Patient expressed having visual problems in left eye- RN to assist with managing medications. )     Social Determinants of Health (SDOH) Interventions     Readmission Risk Interventions No flowsheet data found.

## 2018-09-16 NOTE — Progress Notes (Signed)
Respiratory protocol done and orders changed to TID treatments per protocol. After speaking with patient and assessment patient has BBS coarse with some crackles heard slightly in the bases. No wheezes heard or need for bronchodilators around the clock per protocol. Will try TID and reassess at a later time per protocol.

## 2018-09-16 NOTE — Interval H&P Note (Signed)
History and Physical Interval Note:  09/16/2018 9:57 AM  Mindy Love  has presented today for surgery, with the diagnosis of non stemi.  The various methods of treatment have been discussed with the patient and family. After consideration of risks, benefits and other options for treatment, the patient has consented to  Procedure(s): RIGHT/LEFT HEART CATH AND CORONARY/GRAFT ANGIOGRAPHY (N/A) as a surgical intervention.  The patient's history has been reviewed, patient examined, no change in status, stable for surgery.  I have reviewed the patient's chart and labs.  Questions were answered to the patient's satisfaction.    Cath Lab Visit (complete for each Cath Lab visit)  Clinical Evaluation Leading to the Procedure:   ACS: No.  Non-ACS:    Anginal Classification: CCS III  Anti-ischemic medical therapy: Maximal Therapy (2 or more classes of medications)  Non-Invasive Test Results: No non-invasive testing performed  Prior CABG: Previous CABG       Theron Arista [facility] 09/16/2018 9:57 AM

## 2018-09-16 NOTE — Progress Notes (Signed)
Site area: Right groin a 5 french arterial and a 7 french venous sheath was removed  Site Prior to Removal:  Level 0  Pressure Applied For 25 MINUTES     Beginning at 1150am  Manual:   Yes.    Patient Status During Pull:  stable  Post Pull Groin Site:  Level 0  Post Pull Instructions Given:  Yes.    Post Pull Pulses Present:  Yes.    Dressing Applied:  Yes.    Comments:  VS remain stable

## 2018-09-16 NOTE — Progress Notes (Addendum)
Progress Note  Patient Name: Mindy Love Date of Encounter: 09/16/2018  Primary Cardiologist:  HP remotely  Subjective   Seen following her cardiac catheterization.  I did see her on the way down but did not examine. Feels pretty good this morning post-cath.  Very happy to hear the results. Breathing is notably improved requiring less oxygen today  Inpatient Medications    Scheduled Meds: . aspirin EC  81 mg Oral Daily  . atorvastatin  80 mg Oral q1800  . Chlorhexidine Gluconate Cloth  6 each Topical Daily  . DULoxetine  60 mg Oral BID  . enoxaparin (LOVENOX) injection  90 mg Subcutaneous Q12H  . furosemide  40 mg Intravenous Daily  . gabapentin  400 mg Oral TID  . hydrALAZINE  25 mg Oral BID  . insulin aspart  0-20 Units Subcutaneous Q4H  . insulin glargine  35 Units Subcutaneous Daily  . isosorbide dinitrate  10 mg Oral BID  . methylPREDNISolone (SOLU-MEDROL) injection  60 mg Intravenous Q24H  . metoprolol succinate  50 mg Oral Daily  . nicotine  21 mg Transdermal Daily  . sodium chloride flush  3 mL Intravenous Q12H  . spironolactone  12.5 mg Oral Daily   Continuous Infusions: . sodium chloride 10 mL/hr at 09/14/18 1300  . sodium chloride 1 mL/kg/hr (09/16/18 1235)  . cefTRIAXone (ROCEPHIN)  IV 1 g (09/16/18 0813)   PRN Meds: sodium chloride, acetaminophen, ipratropium-albuterol, metoprolol tartrate, morphine injection, ondansetron (ZOFRAN) IV   Vital Signs    Vitals:   09/16/18 1055 09/16/18 1140 09/16/18 1145 09/16/18 1150  BP: 131/68 (!) 115/57 107/62 (!) 124/57  Pulse: 73 69 69 69  Resp: _0 Temp:      TempSrc:      SpO2: 93% 93% 92% 93%  Weight:      Height:        Intake/Output Summary (Last 24 hours) at 09/16/2018 1400 Last data filed at 09/16/2018 1216 Gross per 24 hour  Intake 358 ml  Output 1500 ml  Net -1142 ml   Last 3 Weights 09/16/2018 09/15/2018 09/12/2018  Weight (lbs) 215 lb 6.2 oz 212 lb 4.8 oz 155 lb 10.3 oz  Weight (kg) 97.7  kg 96.299 kg 70.6 kg      Telemetry    Mostly sinus rhythm in the 70s with vaginal PACs. no PAT episodes.- Personally Reviewed  ECG    None today- Personally Reviewed  Physical Exam   General: Obese woman who appears older than her stated age.; chronically ill-appearing.  Nontoxic Lungs: Diffuse bilateral coarse breath sounds with rhonchi. Heart: RRR, normal S1 S2, soft 1/6 SEM at RUSB, no rubs or gallops.  Bilateral pulses palpable.  Minimal JVP Abdomen: Soft/NT/ND/NABS.  No HSM. 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  Nonfocal. Psych: Normal yet somewhat flat mood and affect.  Answers questions appropriately.   Labs    Chemistry Recent Labs  Lab 09/14/18 0438 09/15/18 0415 09/16/18 0313  NA 135 136 133*  K 4.0 3.8 4.3  CL 98 100 97*  CO2 _1 GLUCOSE 206* 104* 249*  BUN 23* 24* 31*  CREATININE 0.99 0.94 0.98  CALCIUM 8.2* 8.1* 8.5*  PROT 5.5* 5.4* 5.8*  ALBUMIN 1.7* 1.7* 1.7*  AST _2 ALT _3 ALKPHOS 88 96 110  BILITOT 0.8 0.6 0.5  GFRNONAA >60 >60 >60  GFRAA >60 >60 >60  ANIONGAP 10 7  9     Hematology Recent Labs  Lab 09/14/18 0438 09/15/18 0415 09/16/18 0313  WBC 13.7* 12.8* 9.0  RBC 4.39 4.09 4.34  HGB 11.9* 11.2* 12.0  HCT 38.3 35.7* 37.3  MCV 87.2 87.3 85.9  MCH 27.1 27.4 27.6  MCHC 31.1 31.4 32.2  RDW 15.2 15.1 14.6  PLT 252 271 291    Cardiac Enzymes Recent Labs  Lab 09/12/18 1202 09/12/18 1904  TROPONINI 14.91* 12.97*    Radiology    Dg Chest Port 1 View  Result Date: 09/16/2018 CLINICAL DATA:  CHF EXAM: PORTABLE CHEST 1 VIEW COMPARISON:  09/15/2018 FINDINGS: Prior CABG. Cardiomegaly. Bilateral interstitial and airspace opacities, likely edema/CHF, slightly improved since prior study. Improving effusions. No acute bony abnormality. IMPRESSION: Improving pulmonary edema and pleural effusions. Continued mild CHF. Electronically Signed   By: Rolm Baptise M.D.   On: 09/16/2018 07:43    Dg Chest Port 1 View  Result Date: 09/15/2018 CLINICAL DATA:  Shortness of breath and cough EXAM: PORTABLE CHEST 1 VIEW COMPARISON:  09/12/2018 FINDINGS: Cardiac shadow is within normal limits. Lungs are well aerated bilaterally. Diffuse patchy opacities are noted bilaterally increased in the interval from the prior exam consistent with CHF. Underlying atelectasis/early infiltrate is noted in the bases bilaterally. No pneumothorax is seen. The bony structures are within normal limits. Postsurgical changes are again noted. IMPRESSION: Changes consistent with CHF with superimposed bibasilar atelectasis/early infiltrates. Electronically Signed   By: Inez Catalina M.D.   On: 09/15/2018 07:42    Cardiac Studies    TTE: 09/12/18: Severely reduced EF 25-30% with global hypokinesis.  Moderate dilation.  GRII DD (pseudonormalization) with elevated LVEDP).  Normal RV size.  Moderate LA dilation.  Moderate MAC.  Severe aortic sclerosis but no stenosis.   Right and Left Heart Catheterization 09/16/2018: Native vessels:pLAD 100% after D1, OM1 100%,pRCA 100%.  Grafts: patentLIMA-LAD, sSVG-D2-OM1 &SVG-dRCA.  EF 35-40% with global hypokinesis.  Mildly elevated LVEDP 23 mmHg with AoP 149/54 mercury (MAP 89 mmHg).  PA P 33/9 mmHg, PCWP 18 mmHg., RV EDP 7 mmHg  Patient Profile     63 y.o. female hx CAD s/p CABGx4 '12 at [facility], multiple COPD-CHF admissions, paroxysmal A. fib, PAD s/p right AKA, IDDM, GERD, HL and depression who presented with acute CHF and elevated troponins.   Assessment & Plan    1. Demand ischemia from myocarditis: With no evidence of new occlusive CAD, would argue for demand ischemia/myocarditis and not non-STEMI -- have removed NSTEMI FROM PROBLEM LIST. - EF by echo nearly 25%, but no regional RWMAs noted -EF does appear to be better on cath  CAD of native artery-CABG in 2012 was LIMA-LAD, sSVG-diag-OM, SVG-RCA -all native arteries occluded, but grafts patent.  Can discontinue anticoagulation  with treatment dose Lovenox  With reduced EF have converted Lopressor to Toprol   2. Acute Respiratory failure with hypoxia: Still remains on nasal cannula oxygen, but improving from a symptomatic standpoint.  Hypoxemia is out of proportion with right heart cath numbers to suspect that this is truly related to ongoing CHF and pulmonary edema.  More consistent with COPD exacerbation  Converting to oral Lasix today.  3.  Paroxysmal A. fib, PAT:   Given her pulmonary issues, I suspect this is probably more PAT.  No further episodes.  Monitor on beta-blocker.  4.  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  No evidence  of occlusive CAD other than expected post CABG with patent graft. With no new CAD, would suspect that this is either stress-induced cardiomyopathy from COPD exacerbation/pneumonia versus myocarditis.  She will need follow-up echocardiogram in roughly 6-8 weeks to reassess as I suspect that this may improve. Right heart cath today showed only mildly elevated LVEDP and wedge pressure:   We will convert to oral Lasix today.  Converted to Toprol yesterday from Lopressor  Continue low-dose spironolactone  For now, until stable post cath will continue with hydralazine/Isordil, but would consider converting to ARB prior to discharge (would probably consider ARB over Entresto, since the EF 40 appears to be 35 to 40% on LV gram.  5. IDDM:  Management per IM   I expect from cardiac standpoint, that she should be ready for discharge over the weekend.  We will reassess in the morning to make determination about converting from isosorbide/hydralazine to ARB.    Glenetta Hew, MD  09/16/2018, 2:00 PM     Glenetta Hew, M.D., M.S. Interventional Cardiologist   Pager # [phone] Phone # [phone] [address] [place], [place] [address]    For questions or updates, please contact [place] Please consult [email or web address] for contact info under

## 2018-09-17 DIAGNOSIS — I420 Dilated cardiomyopathy: Secondary | ICD-10-CM | POA: Diagnosis present

## 2018-09-17 DIAGNOSIS — Z89611 Acquired absence of right leg above knee: Secondary | ICD-10-CM

## 2018-09-17 DIAGNOSIS — I5041 Acute combined systolic (congestive) and diastolic (congestive) heart failure: Secondary | ICD-10-CM | POA: Diagnosis present

## 2018-09-17 LAB — CBC WITH DIFFERENTIAL/PLATELET
Abs Immature Granulocytes: 0.07 10*3/uL (ref 0.00–0.07)
Basophils Absolute: 0 10*3/uL (ref 0.0–0.1)
Basophils Relative: 0 %
Eosinophils Absolute: 0 10*3/uL (ref 0.0–0.5)
Eosinophils Relative: 0 %
HCT: 38 % (ref 36.0–46.0)
Hemoglobin: 11.9 g/dL — ABNORMAL LOW (ref 12.0–15.0)
Immature Granulocytes: 1 %
Lymphocytes Relative: 7 %
Lymphs Abs: 0.8 10*3/uL (ref 0.7–4.0)
MCH: 26.9 pg (ref 26.0–34.0)
MCHC: 31.3 g/dL (ref 30.0–36.0)
MCV: 86 fL (ref 80.0–100.0)
Monocytes Absolute: 0.1 10*3/uL (ref 0.1–1.0)
Monocytes Relative: 1 %
Neutro Abs: 10.7 10*3/uL — ABNORMAL HIGH (ref 1.7–7.7)
Neutrophils Relative %: 91 %
Platelets: 347 10*3/uL (ref 150–400)
RBC: 4.42 MIL/uL (ref 3.87–5.11)
RDW: 14.7 % (ref 11.5–15.5)
WBC: 11.7 10*3/uL — ABNORMAL HIGH (ref 4.0–10.5)
nRBC: 0 % (ref 0.0–0.2)

## 2018-09-17 LAB — GLUCOSE, CAPILLARY
Glucose-Capillary: 160 mg/dL — ABNORMAL HIGH (ref 70–99)
Glucose-Capillary: 182 mg/dL — ABNORMAL HIGH (ref 70–99)
Glucose-Capillary: 202 mg/dL — ABNORMAL HIGH (ref 70–99)
Glucose-Capillary: 211 mg/dL — ABNORMAL HIGH (ref 70–99)
Glucose-Capillary: 218 mg/dL — ABNORMAL HIGH (ref 70–99)
Glucose-Capillary: 315 mg/dL — ABNORMAL HIGH (ref 70–99)

## 2018-09-17 LAB — COMPREHENSIVE METABOLIC PANEL
ALT: 17 U/L (ref 0–44)
AST: 20 U/L (ref 15–41)
Albumin: 1.8 g/dL — ABNORMAL LOW (ref 3.5–5.0)
Alkaline Phosphatase: 99 U/L (ref 38–126)
Anion gap: 7 (ref 5–15)
BUN: 33 mg/dL — ABNORMAL HIGH (ref 6–20)
CO2: 29 mmol/L (ref 22–32)
Calcium: 8.5 mg/dL — ABNORMAL LOW (ref 8.9–10.3)
Chloride: 102 mmol/L (ref 98–111)
Creatinine, Ser: 0.95 mg/dL (ref 0.44–1.00)
GFR calc Af Amer: >60 mL/min (ref >60)
GFR calc non Af Amer: >60 mL/min (ref >60)
Glucose, Bld: 225 mg/dL — ABNORMAL HIGH (ref 70–99)
Potassium: 4.6 mmol/L (ref 3.5–5.1)
Sodium: 138 mmol/L (ref 135–145)
Total Bilirubin: 0.2 mg/dL — ABNORMAL LOW (ref 0.3–1.2)
Total Protein: 5.6 g/dL — ABNORMAL LOW (ref 6.5–8.1)

## 2018-09-17 LAB — HEMOGLOBIN A1C
Hgb A1c MFr Bld: 9.4 % — ABNORMAL HIGH (ref 4.8–5.6)
Mean Plasma Glucose: 223.08 mg/dL

## 2018-09-17 LAB — LIPID PANEL
Cholesterol: 124 mg/dL (ref 0–200)
HDL: 32 mg/dL — ABNORMAL LOW (ref >40)
LDL Cholesterol: 80 mg/dL (ref 0–99)
Total CHOL/HDL Ratio: 3.9 RATIO
Triglycerides: 62 mg/dL (ref <150)
VLDL: 12 mg/dL (ref 0–40)

## 2018-09-17 LAB — MAGNESIUM: Magnesium: 2.1 mg/dL (ref 1.7–2.4)

## 2018-09-17 LAB — CK: Total CK: 17 U/L — ABNORMAL LOW (ref 38–234)

## 2018-09-17 MED ORDER — INSULIN GLARGINE 100 UNIT/ML ~~LOC~~ SOLN
40.0000 [IU] | Freq: Every day | SUBCUTANEOUS | Status: DC
  Administered 2018-09-18 – 2018-09-19 (×2): 40 [IU] via SUBCUTANEOUS
  Filled 2018-09-17 (×3): qty 0.4

## 2018-09-17 MED ORDER — OXYCODONE HCL 5 MG PO TABS
10.0000 mg | ORAL_TABLET | Freq: Every day | ORAL | Status: DC | PRN
  Administered 2018-09-17 – 2018-09-19 (×7): 10 mg via ORAL
  Filled 2018-09-17 (×8): qty 2

## 2018-09-17 MED ORDER — OXYCODONE HCL 10 MG PO TABS: 10.0000 mg | ORAL_TABLET | Freq: Every day | ORAL | Status: DC

## 2018-09-17 MED ORDER — LOSARTAN POTASSIUM 50 MG PO TABS
50.0000 mg | ORAL_TABLET | Freq: Every day | ORAL | Status: DC
  Administered 2018-09-17 – 2018-09-19 (×3): 50 mg via ORAL
  Filled 2018-09-17 (×3): qty 1

## 2018-09-17 MED ORDER — INSULIN ASPART 100 UNIT/ML ~~LOC~~ SOLN
4.0000 [IU] | Freq: Three times a day (TID) | SUBCUTANEOUS | Status: DC
  Administered 2018-09-17 – 2018-09-18 (×2): 4 [IU] via SUBCUTANEOUS

## 2018-09-17 MED ORDER — FUROSEMIDE 20 MG PO TABS
20.0000 mg | ORAL_TABLET | Freq: Every day | ORAL | Status: DC
  Administered 2018-09-18 – 2018-09-19 (×2): 20 mg via ORAL
  Filled 2018-09-17 (×2): qty 1

## 2018-09-17 MED ORDER — OXYCODONE HCL 5 MG PO TABS
10.0000 mg | ORAL_TABLET | Freq: Three times a day (TID) | ORAL | Status: DC | PRN
  Administered 2018-09-17: 10 mg via ORAL
  Filled 2018-09-17: qty 2

## 2018-09-17 NOTE — Evaluation (Signed)
Occupational Therapy Evaluation Patient Details Name: Mindy Love MRN: [id number] DOB: May 27, 1956 Today's Date: 09/17/2018    History of Present Illness This 63 y.o.  female admitted with fever or SOB, and developed elevated troponin.   COVID - 19 was ruled out.  Dx:  acute respiratory failure with hypoxia, acute pulmonary edema, acute decompensated heart failure, demand ischemia of myocardium, non ischemic cardiomyopahty - stress induced vs. myocarditis.   PMH includes:  Rt AKA, DM, A- FIb, Lt s/p transmetarsal amputation   Clinical Impression   Patient evaluated by Occupational Therapy with no further acute OT needs identified. All education has been completed and the patient has no further questions. Pt is able to perform ADLs at min guard to supervision level.  She lives with family and was mod I for ADLs at w/c level. 02 sats decreased briefly to 86% on RA, but quickly rebounded.   See below for any follow-up Occupational Therapy or equipment needs. OT is signing off. Thank you for this referral.      Follow Up Recommendations  No OT follow up;Supervision - Intermittent    Equipment Recommendations  None recommended by OT    Recommendations for Other Services       Precautions / Restrictions Precautions Precautions: Fall Restrictions Weight Bearing Restrictions: Yes      Mobility Bed Mobility Overal bed mobility: Modified Independent                Transfers Overall transfer level: Needs assistance Equipment used: None Transfers: Sit to/from Stand;Stand Pivot Transfers Sit to Stand: Supervision Stand pivot transfers: Supervision       General transfer comment: supervision for safety     Balance                                           ADL either performed or assessed with clinical judgement   ADL Overall ADL's : Needs assistance/impaired Eating/Feeding: Independent   Grooming: Wash/dry hands;Wash/dry face;Oral care;Brushing  hair;Set up;Sitting   Upper Body Bathing: Set up;Sitting   Lower Body Bathing: Supervison/ safety;Sit to/from stand   Upper Body Dressing : Set up;Sitting   Lower Body Dressing: Supervision/safety;Sit to/from stand   Toilet Transfer: Supervision/safety;Stand-pivot;BSC   Toileting- Architect and Hygiene: Supervision/safety;Sit to/from stand       Functional mobility during ADLs: Supervision/safety General ADL Comments: pt requires supervision for safety      Vision Baseline Vision/History: Cataracts Patient Visual Report: No change from baseline Additional Comments: Pt reports low vision and is followed actively by ophthalmology and is supposed to undergo surgery on Lt eye      Perception     Praxis      Pertinent Vitals/Pain Pain Assessment: No/denies pain     Hand Dominance Right   Extremity/Trunk Assessment Upper Extremity Assessment Upper Extremity Assessment: Generalized weakness   Lower Extremity Assessment Lower Extremity Assessment: Defer to PT evaluation   Cervical / Trunk Assessment Cervical / Trunk Assessment: Normal   Communication Communication Communication: No difficulties   Cognition Arousal/Alertness: Awake/alert Behavior During Therapy: WFL for tasks assessed/performed Overall Cognitive Status: Within Functional Limits for tasks assessed                                     General Comments  02 sats decreased to 86%  briefly on RA, but rebounded to low 90s quickly     Exercises Exercises: Other exercises Other Exercises Other Exercises: Pt performed 20 reps w/c push ups    Shoulder Instructions      Home Living Family/patient expects to be discharged to:: Private residence Living Arrangements: Spouse/significant other;Children Available Help at Discharge: Family Type of Home: Mobile home Home Access: Ramped entrance     Home Layout: One level     Bathroom Shower/Tub: Contractor: Standard Bathroom Accessibility: Yes How Accessible: Accessible via wheelchair Home Equipment: Bedside commode;Tub bench          Prior Functioning/Environment Level of Independence: Independent with assistive device(s)        Comments: Pt relies on manual w/c for mobility.  She has a prosthesis, but reports a part is missing and she is unable to use it.         OT Problem List: Decreased strength;Decreased activity tolerance;Cardiopulmonary status limiting activity      OT Treatment/Interventions:      OT Goals(Current goals can be found in the care plan section) Acute Rehab OT Goals Patient Stated Goal: to go home and improve abilty to get around  OT Goal Formulation: All assessment and education complete, DC therapy  OT Frequency:     Barriers to D/C:            Co-evaluation              AM-PAC OT "6 Clicks" Daily Activity     Outcome Measure Help from another person eating meals?: None Help from another person taking care of personal grooming?: None Help from another person toileting, which includes using toliet, bedpan, or urinal?: None Help from another person bathing (including washing, rinsing, drying)?: None Help from another person to put on and taking off regular upper body clothing?: None Help from another person to put on and taking off regular lower body clothing?: None 6 Click Score: 24   End of Session Nurse Communication: Mobility status  Activity Tolerance: Patient tolerated treatment well Patient left: in chair;with call bell/phone within reach;Other (comment)(with PT )  OT Visit Diagnosis: Muscle weakness (generalized) (M62.81)                Time: 0102-7253 OT Time Calculation (min): 22 min Charges:  OT General Charges $OT Visit: 1 Visit OT Evaluation $OT Eval Low Complexity: 1 Low  Jeani Hawking, OTR/L Acute Rehabilitation Services Pager [phone] Office [phone]   Jeani Hawking M 09/17/2018, 2:28 PM

## 2018-09-17 NOTE — Progress Notes (Addendum)
PROGRESS NOTE    Mindy Love  IHW:[id number] DOB: 05-30-1956 DOA: 09/12/2018 PCP: [name]., MD   Brief Narrative:  63 y.o. obese BF PMHx anxiety, depression, diabetes type 2 uncontrolled with complication, MI, CAD, HLD, CAD, tobacco abuse  Transferred to the [facility] from [facility] for higher level of care.  She presented to [facility] health with complaints of fever and shortness of breath.  She was deemed to need COVID-19 ruled out.  She was originally slated to be transferred to [facility]; however, in the emergency department at [facility], her troponin went from 0.2 to 9+, prompting potential need for cardiology evaluation.  Consequently, she was transported to [facility].  Currently, she reports chest pain 8/10, refractory to sublingual NTG x 3 doses. Tightness in the back. Pleuritic pain under the R breast and paroxysmal coughing.   In the emergency department at [facility], she was observed to have SpO2 in the 70s.  Chest x-ray was obtained (report provided, images not included) with report of findings of pulmonary edema.  No fever at present but apparently had a fever of 102 (per nursing notes).  No known sick contacts.      Subjective:  4/4 A/O x4, positive S OB but significantly improved, now on room air, requesting to get a wheelchair in order will her cell phone on the ward.   Assessment & Plan:   Principal Problem:   Acute hypoxemic respiratory failure (HCC) Active Problems:   Acute pulmonary edema (HCC)   Tobacco abuse   Acute decompensated heart failure (HCC)   Leukocytosis   Type 2 diabetes mellitus with vascular disease (HCC)   Demand ischemia of myocardium (HCC) -elevated troponin with no regional wall motion normality and no new CAD findings.   Nonischemic cardiomyopathy (HCC) -stress-induced versus myocarditis  Acute respiratory failure with hypoxia - Titrate O2 to maintain SPO2  89 to 92% -Complete 7-day course antibiotics - DuoNeb QID - Pulmonary toilet - Solu-Medrol 60 mg daily  CAP  -See acute respiratory failure  SARS/C coronavirus - Some concern for patient having coronavirus but patient negative for coronavirus.  Tobacco abuse -Patient counseled extensively on risk factor of continued to smoke especially in pandemic.  Patient understands to continue to do so puts her at high risk of death. - Nicotine patch  Systolic and Diastolic CHF/Dilated cardiomyopathy - Strict in and out -1.4 L -Daily weight Filed Weights   09/15/18 0430 09/16/18 0432 09/17/18 0441  Weight: 96.3 kg 97.7 kg 96.2 kg  -Per cardiology's note close cardiac cath secondary to demand ischemia/myocarditis. -ASA 81 mg daily - Lasix 40 mg daily - Hydralazine 25 mg twice daily -Isosorbide dinitrate 10 mg twice daily -Toprol XL 50 mg daily -Spironolactone 12.5 mg daily  Paroxysmal A. 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   Goals of care - 4/4 PT/OT consult placed evaluate for SNF vs CIR    DVT prophylaxis: Full dose Lovenox Code Status: Full Family Communication: None Disposition Plan: Per cardiology   Consultants:  Cardiology Dr. Bryan Lemma    Procedures/Significant Events:  3/30 echocardiogram: LVEF =25-30%.-  Elevated left ventricular end-diastolic pressure -Left  Atrium: moderately dilated 4/3 LEFT/RIGHT catheterization:- LVEF 35 -40% with global hypokinesis   I have personally reviewed and interpreted all radiology studies and my findings are as above.  VENTILATOR SETTINGS:    Cultures 3/30 coronavirus negative 3/30 MRSA by PCR negative   Antimicrobials:  Anti-infectives (From admission, onward)   Start     Ordered Stop   09/15/18 1000  azithromycin (ZITHROMAX) tablet 500 mg     09/14/18 1020 09/17/18 0959   09/12/18 0800  cefTRIAXone (ROCEPHIN) 1 g in sodium chloride 0.9 % 100 mL IVPB     09/12/18 0758 09/19/18 0759   09/12/18 0800  azithromycin (ZITHROMAX) 500 mg in sodium chloride 0.9 % 250 mL IVPB  Status:  Discontinued     09/12/18 0758 09/14/18 1020       Devices None   LINES / TUBES:  None    Continuous Infusions: . sodium chloride 10 mL/hr at 09/14/18 1300  . sodium chloride    . cefTRIAXone (ROCEPHIN)  IV 1 g (09/16/18 0813)     Objective: Vitals:   09/17/18 0441 09/17/18 0733 09/17/18 0759 09/17/18 0813  BP: 136/71 (!) 144/72    Pulse: 66 65 65   Resp:      Temp: 97.8 F (36.6 C) 98 F (36.7 C)    TempSrc: Oral Oral    SpO2: 94% (!) 89%  91%  Weight: 96.2 kg     Height:        Intake/Output Summary (Last 24 hours) at 09/17/2018 1110 Last data filed at 09/17/2018 0442 Gross per 24 hour  Intake 240 ml  Output 1750 ml  Net -1510 ml   Filed Weights   09/15/18 0430 09/16/18 0432 09/17/18 0441  Weight: 96.3 kg 97.7 kg 96.2 kg   General: A/O x4 positive acute respiratory distress, but significantly improved Eyes: negative scleral hemorrhage, negative anisocoria, negative icterus ENT: Negative Runny nose, negative gingival bleeding, Neck:  Negative scars, masses, torticollis, lymphadenopathy, JVD Lungs: Clear to auscultation bilaterally without wheezes or crackles Cardiovascular: Regular rate and rhythm without murmur gallop or rub normal S1 and S2 Abdomen: negative abdominal pain, nondistended, positive soft, bowel sounds, no rebound, no ascites, no appreciable mass Extremities: No significant cyanosis, clubbing, or edema LEFT lower extremity.  RIGHT AKA Skin: Negative rashes, lesions, ulcers Psychiatric:  Negative depression, negative anxiety, negative fatigue, negative mania  Central nervous system:   Cranial nerves II through XII intact, tongue/uvula midline, all extremities muscle strength 5/5, sensation intact throughout,  negative dysarthria, negative expressive aphasia, negative receptive aphasia.  Data Reviewed: Care during the described time interval was provided by me .  I have reviewed this patient's available data, including medical history, events of note, physical examination, and all test results as part of my evaluation.   CBC: Recent Labs  Lab 09/14/18 0438 09/15/18 0415 09/16/18 0313 09/16/18 1039 09/16/18 1614 09/17/18 0248  WBC 13.7* 12.8* 9.0  --  13.2* 11.7*  NEUTROABS 10.9* 9.6* 8.2*  --   --  10.7*  HGB 11.9* 11.2* 12.0 12.2  12.2 12.9 11.9*  HCT 38.3 35.7* 37.3 36.0  36.0 41.6 38.0  MCV 87.2 87.3 85.9  --  86.8 86.0  PLT 252 271 291  --  319 347   Basic Metabolic Panel: Recent Labs  Lab 09/13/18 0436 09/14/18 0438 09/15/18 0415 09/16/18 0313 09/16/18 1039 09/16/18 1614 09/17/18 0248  NA 137 135 136 133* 138  138  --  138  K 4.1 4.0 3.8 4.3 4.0  4.1  --  4.6  CL 100 98 100 97*  --   --  102  CO2 27 27 29 27   --   --  29  GLUCOSE 136* 206* 104* 249*  --   --  225*  BUN 16 23* 24* 31*  --   --  33*  CREATININE 1.10* 0.99 0.94 0.98  --  1.06* 0.95  CALCIUM 8.2* 8.2* 8.1* 8.5*  --   --  8.5*  MG 1.5* 1.7 2.2 2.2  --   --  2.1  PHOS  --   --  2.7  --   --   --   --    GFR: Estimated Creatinine Clearance: 69.5 mL/min (by C-G formula based on SCr of 0.95 mg/dL). Liver Function Tests: Recent Labs  Lab 09/13/18 0436 09/14/18 0438 09/15/18 0415 09/16/18 0313 09/17/18 0248  AST 30 17 15 15 20   ALT 15 13 12 13 17   ALKPHOS 95 88 96 110 99  BILITOT 1.2 0.8 0.6 0.5 0.2*  PROT 5.5* 5.5* 5.4* 5.8* 5.6*  ALBUMIN 2.0* 1.7* 1.7* 1.7* 1.8*   No results for input(s): LIPASE, AMYLASE in the last 168 hours. No results for input(s): AMMONIA in the last 168 hours. Coagulation Profile: No results for input(s): INR, PROTIME in the last 168 hours. Cardiac  Enzymes: Recent Labs  Lab 09/12/18 1202 09/12/18 1904 09/13/18 0436 09/14/18 0438 09/15/18 0415 09/16/18 0313 09/17/18 0248  CKTOTAL  --   --  133 48 30* 24* 17*  TROPONINI 14.91* 12.97*  --   --   --   --   --    BNP (last 3 results) No results for input(s): PROBNP in the last 8760 hours. HbA1C: Recent Labs    09/17/18 0248  HGBA1C 9.4*   CBG: Recent Labs  Lab 09/16/18 1744 09/16/18 2044 09/17/18 0007 09/17/18 0421 09/17/18 0730  GLUCAP 367* 136* 218* 211* 160*   Lipid Profile: Recent Labs    09/17/18 0248  CHOL 124  HDL 32*  LDLCALC 80  TRIG 62  CHOLHDL 3.9   Thyroid Function Tests: No results for input(s): TSH, T4TOTAL, FREET4, T3FREE, THYROIDAB in the last 72 hours. Anemia Panel: No results for input(s): VITAMINB12, FOLATE, FERRITIN, TIBC, IRON, RETICCTPCT in the last 72 hours. Urine analysis:    Component Value Date/Time   COLORURINE YELLOW 10/30/2016 1113   APPEARANCEUR CLOUDY (A) 10/30/2016 1113   LABSPEC 1.024 10/30/2016 1113   PHURINE 8.0 10/30/2016 1113   GLUCOSEU >=500 (A) 10/30/2016 1113   HGBUR NEGATIVE 10/30/2016 1113   BILIRUBINUR NEGATIVE 10/30/2016 1113   KETONESUR 20 (A) 10/30/2016 1113   PROTEINUR >=300 (A) 10/30/2016 1113   UROBILINOGEN 1.0 12/16/2011 1015   NITRITE NEGATIVE 10/30/2016 1113   LEUKOCYTESUR NEGATIVE 10/30/2016 1113   Sepsis Labs: @LABRCNTIP (procalcitonin:4,lacticidven:4)  ) Recent Results (from the past 240 hour(s))  MRSA PCR Screening     Status: None   Collection Time: 09/12/18  5:48 AM  Result Value Ref Range Status   MRSA by PCR NEGATIVE NEGATIVE Final    Comment:        The GeneXpert MRSA Assay (FDA approved for NASAL specimens only), is one component of a comprehensive MRSA colonization surveillance program. It is not intended to diagnose MRSA infection nor to guide or monitor treatment for MRSA infections. Performed at [facility], [address] [place], [place] [address]   Novel  Coronavirus, NAA (hospital order; send-out to ref lab)  Status: None   Collection Time: 09/12/18  8:33 AM  Result Value Ref Range Status   SARS-CoV-2, NAA NOT DETECTED NOT DETECTED Final    Comment: Negative (Not Detected) results do not exclude infection caused by SARS CoV 2 and should not be used as the sole basis for treatment or other patient management decisions. Optimum specimen types and timing for peak viral levels during infections caused  by SARS CoV 2 have not been determined. Collection of multiple specimens (types and time points) from the same patient may be necessary to detect the virus. Improper specimen collection and handling, sequence variability underlying assay primers and or probes, or the presence of organisms in  quantities less than the limit of detection of the assay may lead to false negative results. Positive and negative predictive values of testing are highly dependent on prevalence. False negative results are more likely when prevalence of disease is high. (NOTE) The expected result is Negative (Not Detected). The SARS CoV 2 test is intended for the presumptive qualitative  detection of nucleic acid from SARS CoV 2 in upper and lower  respir atory specimens. Testing methodology is real time RT PCR. Test results must be correlated with clinical presentation and  evaluated in the context of other laboratory and epidemiologic data.  Test performance can be affected because the epidemiology and  clinical spectrum of infection caused by SARS CoV 2 is not fully  known. For example, the optimum types of specimens to collect and  when during the course of infection these specimens are most likely  to contain detectable viral RNA may not be known. This test has not been Food and Drug Administration (FDA) cleared or  approved and has been authorized by FDA under an Emergency Use  Authorization (EUA). The test is only authorized for the duration of  the declaration  that circumstances exist justifying the authorization  of emergency use of in vitro diagnostic tests for detection and or  diagnosis of SARS CoV 2 under Section 564(b)(1) of the Act, 21 U.S.C.  section [phone] 3(b)(1), unless the authorization is terminated or   revoked sooner. [facility] is certified under the  Clinical Laboratory Improvement Amendments of 1988 (CLIA), 42 U.S.C.  section [phone], to perform high complexity tests. Performed at [facility] CLIA [id number] [address], [address], [place], [place] [address] Laboratory Director: Turner Daniels, MD    Coronavirus Source NASOPHARYNGEAL  Final    Comment: Performed at [facility], [address], [place], [place] [address]         Radiology Studies: Dg Chest Port 1 View  Result Date: 09/16/2018 CLINICAL DATA:  CHF EXAM: PORTABLE CHEST 1 VIEW COMPARISON:  09/15/2018 FINDINGS: Prior CABG. Cardiomegaly. Bilateral interstitial and airspace opacities, likely edema/CHF, slightly improved since prior study. Improving effusions. No acute bony abnormality. IMPRESSION: Improving pulmonary edema and pleural effusions. Continued mild CHF. Electronically Signed   By: Charlett Nose M.D.   On: 09/16/2018 07:43        Scheduled Meds: . aspirin EC  81 mg Oral Daily  . atorvastatin  80 mg Oral q1800  . Chlorhexidine Gluconate Cloth  6 each Topical Daily  . DULoxetine  60 mg Oral BID  . enoxaparin (LOVENOX) injection  40 mg Subcutaneous Q24H  . [START ON 09/18/2018] furosemide  20 mg Oral Daily  . gabapentin  400 mg Oral TID  . insulin aspart  0-20 Units Subcutaneous Q4H  .  insulin glargine  35 Units Subcutaneous Daily  . losartan  50 mg Oral Daily  . methylPREDNISolone (SOLU-MEDROL) injection  60 mg Intravenous Q24H  . metoprolol succinate  50 mg Oral Daily  . nicotine  21 mg Transdermal Daily  . sodium chloride flush  3 mL Intravenous Q12H  . sodium chloride flush  3 mL Intravenous  Q12H  . spironolactone  12.5 mg Oral Daily   Continuous Infusions: . sodium chloride 10 mL/hr at 09/14/18 1300  . sodium chloride    . cefTRIAXone (ROCEPHIN)  IV 1 g (09/16/18 0813)     LOS: 5 days    Time spent: 40 minutes    Winola Drum, Roselind Messier, MD [facility] Pager [phone]   If 7PM-7AM, please contact night-coverage [email or web address] Password [email or web address] 09/17/2018, 11:10 AM

## 2018-09-17 NOTE — Progress Notes (Signed)
Progress Note  Patient Name: TYNIKA STANGLER Date of Encounter: 09/17/2018  Primary Cardiologist: New  Subjective   No CP or dyspnea  Inpatient Medications    Scheduled Meds: . aspirin EC  81 mg Oral Daily  . atorvastatin  80 mg Oral q1800  . Chlorhexidine Gluconate Cloth  6 each Topical Daily  . DULoxetine  60 mg Oral BID  . enoxaparin (LOVENOX) injection  40 mg Subcutaneous Q24H  . furosemide  40 mg Oral Daily  . gabapentin  400 mg Oral TID  . hydrALAZINE  25 mg Oral BID  . insulin aspart  0-20 Units Subcutaneous Q4H  . insulin glargine  35 Units Subcutaneous Daily  . isosorbide dinitrate  10 mg Oral BID  . methylPREDNISolone (SOLU-MEDROL) injection  60 mg Intravenous Q24H  . metoprolol succinate  50 mg Oral Daily  . nicotine  21 mg Transdermal Daily  . sodium chloride flush  3 mL Intravenous Q12H  . sodium chloride flush  3 mL Intravenous Q12H  . spironolactone  12.5 mg Oral Daily   Continuous Infusions: . sodium chloride 10 mL/hr at 09/14/18 1300  . sodium chloride    . cefTRIAXone (ROCEPHIN)  IV 1 g (09/16/18 0813)   PRN Meds: sodium chloride, sodium chloride, acetaminophen, albuterol, metoprolol tartrate, morphine injection, ondansetron (ZOFRAN) IV, sodium chloride flush   Vital Signs    Vitals:   09/17/18 0441 09/17/18 0733 09/17/18 0759 09/17/18 0813  BP: 136/71 (!) 144/72    Pulse: 66 65 65   Resp:      Temp: 97.8 F (36.6 C) 98 F (36.7 C)    TempSrc: Oral Oral    SpO2: 94% (!) 89%  91%  Weight: 96.2 kg     Height:        Intake/Output Summary (Last 24 hours) at 09/17/2018 0911 Last data filed at 09/17/2018 0442 Gross per 24 hour  Intake 240 ml  Output 2150 ml  Net -1910 ml   Last 3 Weights 09/17/2018 09/16/2018 09/15/2018  Weight (lbs) 212 lb 1.3 oz 215 lb 6.2 oz 212 lb 4.8 oz  Weight (kg) 96.2 kg 97.7 kg 96.299 kg      Telemetry    Sinus - Personally Reviewed  Physical Exam   GEN: No acute distress.   Neck: No JVD Cardiac: RRR, no  murmurs, rubs, or gallops.  Respiratory: Diminished BS GI: Soft, nontender, non-distended  MS: No edema; femora cath site with no hematoma and no bruit Neuro:  Nonfocal  Psych: Normal affect   Labs    Chemistry Recent Labs  Lab 09/15/18 0415 09/16/18 0313 09/16/18 1039 09/16/18 1614 09/17/18 0248  NA 136 133* 138  138  --  138  K 3.8 4.3 4.0  4.1  --  4.6  CL 100 97*  --   --  102  CO2 29 27  --   --  29  GLUCOSE 104* 249*  --   --  225*  BUN 24* 31*  --   --  33*  CREATININE 0.94 0.98  --  1.06* 0.95  CALCIUM 8.1* 8.5*  --   --  8.5*  PROT 5.4* 5.8*  --   --  5.6*  ALBUMIN 1.7* 1.7*  --   --  1.8*  AST 15 15  --   --  20  ALT 12 13  --   --  17  ALKPHOS 96 110  --   --  99  BILITOT 0.6 0.5  --   --  0.2*  GFRNONAA >60 >60  --  57* >60  GFRAA >60 >60  --  >60 >60  ANIONGAP 7 9  --   --  7     Hematology Recent Labs  Lab 09/16/18 0313 09/16/18 1039 09/16/18 1614 09/17/18 0248  WBC 9.0  --  13.2* 11.7*  RBC 4.34  --  4.79 4.42  HGB 12.0 12.2  12.2 12.9 11.9*  HCT 37.3 36.0  36.0 41.6 38.0  MCV 85.9  --  86.8 86.0  MCH 27.6  --  26.9 26.9  MCHC 32.2  --  31.0 31.3  RDW 14.6  --  14.7 14.7  PLT 291  --  319 347    Cardiac Enzymes Recent Labs  Lab 09/12/18 1202 09/12/18 1904  TROPONINI 14.91* 12.97*    Radiology    Dg Chest Port 1 View  Result Date: 09/16/2018 CLINICAL DATA:  CHF EXAM: PORTABLE CHEST 1 VIEW COMPARISON:  09/15/2018 FINDINGS: Prior CABG. Cardiomegaly. Bilateral interstitial and airspace opacities, likely edema/CHF, slightly improved since prior study. Improving effusions. No acute bony abnormality. IMPRESSION: Improving pulmonary edema and pleural effusions. Continued mild CHF. Electronically Signed   By: Charlett Nose M.D.   On: 09/16/2018 07:43    Patient Profile     63 y.o. female hx CAD s/p CABGx4 '12 at [facility], multiple COPD-CHF admissions, paroxysmal A. fib, PAD s/p right AKA, IDDM, GERD, HL and depression who presented with acute  CHF and elevated troponins.  Echocardiogram showed ejection fraction 25 to 30%, mild aortic insufficiency and mild mitral regurgitation.  Cardiac catheterization revealed severe three-vessel coronary artery disease but all grafts patent.  Ejection fraction 35 to 45%.  Assessment & Plan    1 non-ST elevation myocardial infarction-cardiac catheterization results noted.  All grafts patent.  Plan to continue medical therapy with aspirin and statin.  Continue beta-blocker.  Given elevated troponin will add Plavix 75 mg daily.  2 ischemic cardiomyopathy-LV function severely reduced at time of echocardiogram and moderately reduced at catheterization.  Continue beta-blocker.  Discontinue hydralazine/nitrates.  Add ARB.  Could transition to [facility] as an outpatient.  Patient will need follow-up echocardiogram in 3 months.  If ejection fraction less than 35% would need to consider ICD.  3 possible history of paroxysmal atrial fibrillation-not well defined during this admission.  She is in sinus.  Continue beta-blocker and aspirin.  4 acute systolic congestive heart failure-she appears to be euvolemic.  Continue present dose of spironolactone and decrease Lasix to 20 mg daily.  She will need potassium and renal function checked 1 week after discharge.  5 pneumonia/COPD-pulmonary toilet per primary care.  Patient can be discharged from a cardiac standpoint once pulmonary issues addressed.  She will need follow-up in [place] in 8 weeks.  Check potassium and renal function in 1 week.  We will sign off.  Please call with questions.  For questions or updates, please contact [facility] Please consult [email or web address] for contact info under        Signed, Olga Millers, MD  09/17/2018, 9:11 AM

## 2018-09-17 NOTE — Evaluation (Addendum)
Physical Therapy Evaluation Patient Details Name: Mindy Love MRN: [id number] DOB: 12-29-1955 Today's Date: 09/17/2018   History of Present Illness  This 63 y.o.  female admitted with fever or SOB, and developed elevated troponin.   COVID - 19 was ruled out.  Dx:  acute respiratory failure with hypoxia, acute pulmonary edema, acute decompensated heart failure, demand ischemia of myocardium, non ischemic cardiomyopahty - stress induced vs. myocarditis.   PMH includes:  Rt AKA, DM, A- FIb, Lt s/p transmetarsal amputation  Clinical Impression  Patient appears to be near baseline mobility. At baseline she was able to stand and transfer to a wheelchair. When she has her prosthetic she can take some steps. She is currently missing part of her prosthetic so her baseline is bed to wheelchair. She required supervision to stand and transfer to the bed and then back to the chair. She stood for 1.5 minutes and her Sao2 actually increased. Acute therapy will continue to follow her. She was questioned as to wether home health might be beneficial when she gets the pieces for her prosthetic. She declined at this time. She feels she will do fine and is concerned about transmission of Covid-19 into her house.     Follow Up Recommendations No PT follow up    Equipment Recommendations       Recommendations for Other Services       Precautions / Restrictions Precautions Precautions: Fall Precaution Comments: right AKA Restrictions Weight Bearing Restrictions: Yes      Mobility  Bed Mobility Overal bed mobility: Modified Independent             General bed mobility comments: per OT note   Transfers Overall transfer level: Needs assistance Equipment used: None Transfers: Sit to/from [employer] Transfers Sit to Stand: Supervision Stand pivot transfers: Supervision       General transfer comment: Patient  transfered sit to stand 2x with PT. On the seond trial she stood for 1 minute.  Her sao2 remained   Ambulation/Gait Ambulation/Gait assistance: Min guard Gait Distance (Feet): 3 Feet Assistive device: Rolling walker (2 wheeled) Gait Pattern/deviations: Shuffle Gait velocity: decreased   General Gait Details: Abel to scoot her foot forward a few feet and then shuffle back safely. Min gaurd for Scientific laboratory technician Rank[employer] (Stroke Patients Only)       Balance                                             Pertinent Vitals/Pain Pain Assessment: No/denies pain    Home Living Family/patient expects to be discharged to:: Private residence Living Arrangements: Spouse/significant other;Children Available Help at Discharge: Family Type of Home: Mobile home Home Access: Ramped entrance     Home Layout: One level Home Equipment: Bedside commode;Tub bench      Prior Function Level of Independence: Independent with assistive device(s)         Comments: Mianly transfered to wheelchair in her house. Patient was able to walk with her prothetic but she has lost her sleeve.      Hand Dominance   Dominant Hand: Right    Extremity/Trunk Assessment   Upper Extremity Assessment Upper Extremity Assessment: Defer to OT evaluation    Lower Extremity Assessment Lower Extremity Assessment: Generalized weakness;RLE deficits/detail  RLE Deficits / Details: right AKA    Cervical / Trunk Assessment Cervical / Trunk Assessment: Normal  Communication   Communication: No difficulties  Cognition Arousal/Alertness: Awake/alert Behavior During Therapy: WFL for tasks assessed/performed Overall Cognitive Status: Within Functional Limits for tasks assessed                                        General Comments General comments (skin integrity, edema, etc.): 02 sats decreased to 86% briefly on RA, but rebounded to low 90s quickly     Exercises Other Exercises Other Exercises: Pt  performed 20 reps w/c push ups    Assessment/Plan    PT Assessment Patient needs continued PT services  PT Problem List Decreased strength;Decreased range of motion;Decreased activity tolerance;Decreased balance;Decreased mobility;Decreased safety awareness       PT Treatment Interventions DME instruction;Gait training;Stair training;Functional mobility training;Therapeutic activities;Therapeutic exercise;Patient/family education    PT Goals (Current goals can be found in the Care Plan section)  Acute Rehab PT Goals Patient Stated Goal: to go home  PT Goal Formulation: With patient Time For Goal Achievement: 09/24/18 Potential to Achieve Goals: Good    Frequency Min 2X/week   Barriers to discharge        Co-evaluation               AM-PAC PT "6 Clicks" Mobility  Outcome Measure Help needed turning from your back to your side while in a flat bed without using bedrails?: None Help needed moving from lying on your back to sitting on the side of a flat bed without using bedrails?: None Help needed moving to and from a bed to a chair (including a wheelchair)?: A Little Help needed standing up from a chair using your arms (e.g., wheelchair or bedside chair)?: A Little Help needed to walk in hospital room?: A Lot Help needed climbing 3-5 steps with a railing? : Total 6 Click Score: 17    End of Session Equipment Utilized During Treatment: Gait belt Activity Tolerance: Patient tolerated treatment well Patient left: in chair Nurse Communication: Mobility status PT Visit Diagnosis: Unsteadiness on feet (R26.81);Other abnormalities of gait and mobility (R26.89);Difficulty in walking, not elsewhere classified (R26.2)    Time: 2725-3664 PT Time Calculation (min) (ACUTE ONLY): 19 min   Charges:   PT Evaluation $PT Eval Moderate Complexity: 1 Mod            Dessie Coma PT DPT  09/17/2018, 3:59 PM

## 2018-09-18 DIAGNOSIS — Z89611 Acquired absence of right leg above knee: Secondary | ICD-10-CM

## 2018-09-18 LAB — BASIC METABOLIC PANEL
Anion gap: 7 (ref 5–15)
BUN: 41 mg/dL — ABNORMAL HIGH (ref 6–20)
CO2: 29 mmol/L (ref 22–32)
Calcium: 8.6 mg/dL — ABNORMAL LOW (ref 8.9–10.3)
Chloride: 99 mmol/L (ref 98–111)
Creatinine, Ser: 1.04 mg/dL — ABNORMAL HIGH (ref 0.44–1.00)
GFR calc Af Amer: >60 mL/min (ref >60)
GFR calc non Af Amer: 58 mL/min — ABNORMAL LOW (ref >60)
Glucose, Bld: 319 mg/dL — ABNORMAL HIGH (ref 70–99)
Potassium: 4.8 mmol/L (ref 3.5–5.1)
Sodium: 135 mmol/L (ref 135–145)

## 2018-09-18 LAB — CBC
HCT: 37.4 % (ref 36.0–46.0)
Hemoglobin: 12.3 g/dL (ref 12.0–15.0)
MCH: 28.3 pg (ref 26.0–34.0)
MCHC: 32.9 g/dL (ref 30.0–36.0)
MCV: 86.2 fL (ref 80.0–100.0)
Platelets: 348 10*3/uL (ref 150–400)
RBC: 4.34 MIL/uL (ref 3.87–5.11)
RDW: 15 % (ref 11.5–15.5)
WBC: 13.5 10*3/uL — ABNORMAL HIGH (ref 4.0–10.5)
nRBC: 0 % (ref 0.0–0.2)

## 2018-09-18 LAB — GLUCOSE, CAPILLARY
Glucose-Capillary: 108 mg/dL — ABNORMAL HIGH (ref 70–99)
Glucose-Capillary: 121 mg/dL — ABNORMAL HIGH (ref 70–99)
Glucose-Capillary: 147 mg/dL — ABNORMAL HIGH (ref 70–99)
Glucose-Capillary: 155 mg/dL — ABNORMAL HIGH (ref 70–99)
Glucose-Capillary: 285 mg/dL — ABNORMAL HIGH (ref 70–99)
Glucose-Capillary: 327 mg/dL — ABNORMAL HIGH (ref 70–99)

## 2018-09-18 LAB — MAGNESIUM: Magnesium: 2.1 mg/dL (ref 1.7–2.4)

## 2018-09-18 MED ORDER — INSULIN ASPART 100 UNIT/ML ~~LOC~~ SOLN
10.0000 [IU] | Freq: Three times a day (TID) | SUBCUTANEOUS | Status: DC
  Administered 2018-09-18 – 2018-09-19 (×4): 10 [IU] via SUBCUTANEOUS

## 2018-09-18 NOTE — Progress Notes (Signed)
Unable to schedule patient's follow up appointment with Dr. Renae Fickle per order from Dr. Joseph Art since it is Sunday. Note is in AVS for patient to schedule appointment after discharge. Spoke with patient who verbalizes understanding and consents.

## 2018-09-18 NOTE — Progress Notes (Signed)
PROGRESS NOTE    Mindy Love  ZOX:[id number] DOB: Nov 09, 1955 DOA: 09/12/2018 PCP: [name]., MD   Brief Narrative:  63 y.o. obese BF PMHx anxiety, depression, diabetes type 2 uncontrolled with complication, MI, CAD, HLD, CAD, tobacco abuse  Transferred to the [facility] Emergency Department from [facility] for higher level of care.  She presented to [facility] health with complaints of fever and shortness of breath.  She was deemed to need COVID-19 ruled out.  She was originally slated to be transferred to [facility]; however, in the emergency department at [facility], her troponin went from 0.2 to 9+, prompting potential need for cardiology evaluation.  Consequently, she was transported to [facility].  Currently, she reports chest pain 8/10, refractory to sublingual NTG x 3 doses. Tightness in the back. Pleuritic pain under the R breast and paroxysmal coughing.   In the emergency department at [facility] she was observed to have SpO2 in the 70s.  Chest x-ray was obtained (report provided, images not included) with report of findings of pulmonary edema.  No fever at present but apparently had a fever of 102 (per nursing notes).  No known sick contacts.      Subjective: 4/5 A/O x4, negative S OB, negative CP, negative abdominal pain.  Sitting in chair comfortably   Assessment & Plan:   Principal Problem:   Acute hypoxemic respiratory failure (HCC) Active Problems:   Acute pulmonary edema (HCC)   Tobacco abuse   Acute decompensated heart failure (HCC)   Leukocytosis   Type 2 diabetes mellitus with vascular disease (HCC)   Demand ischemia of myocardium (HCC) -elevated troponin with no regional wall motion normality and no new CAD findings.   Nonischemic cardiomyopathy (HCC) -stress-induced versus myocarditis   Hx of AKA (above knee amputation), right (HCC)   Systolic and diastolic CHF, acute (HCC)   Dilated cardiomyopathy  (HCC)  Acute respiratory failure with hypoxia - Titrate O2 to maintain SPO2 89 to 92% -Complete 7-day course antibiotics - DuoNeb QID - Pulmonary toilet  CAP  -See acute respiratory failure  SARS/C coronavirus - Some concern for patient having coronavirus but patient negative for coronavirus.  Tobacco abuse -Patient counseled extensively on risk factor of continued to smoke especially in pandemic.  Patient understands to continue to do so puts her at high risk of death. - Nicotine patch - In 6 to 8 weeks patient will require appointment with pulmonologist for spirometry pre-/postbronchodilator, DLCO, possible sleep study  Systolic and Diastolic CHF/Dilated cardiomyopathy - Strict in and out -3.1 L -Daily weight Filed Weights   09/16/18 0432 09/17/18 0441 09/18/18 0417  Weight: 97.7 kg 96.2 kg 96.6 kg  -Per cardiology's note close cardiac cath secondary to demand ischemia/myocarditis. -ASA 81 mg daily - Lasix 40 mg daily - Hydralazine 25 mg twice daily -Isosorbide dinitrate 10 mg twice daily -Toprol XL 50 mg daily -Spironolactone 12.5 mg daily - Schedule follow-up in 1 week with Dr. Renae Fickle for potassium/renal function panel per cardiology. - Schedule PCP follow-up for 1 week check of potassium and renal function  Paroxysmal A. Fib vs PAT See CHF  Diabetes type 2 uncontrolled with complications -  4/4 hemoglobin A1c= 11.9 -Lipid panel pending -4/4 increase Lantus 40 units daily -4/5 increase NovoLog 10 units QAC - Resistant SSI -Patient's CBG should be WNL by a.m. 4/6 with adjustments in insulin and discontinuation of steroids.  HLD - LDL not within ADA guidelines - Lipitor 80  mg daily  RIGHT AKA - Patient states lost part of her prosthetic, cannot have repaired here as she is seen in [place] [facility].   Goals of care - 4/4 PT/OT consult placed evaluate for SNF vs CIR    DVT prophylaxis: Full dose Lovenox Code Status: Full Family  Communication: None Disposition Plan: Per cardiology/PT/OT safe for discharge to home   Consultants:  Cardiology Dr. Bryan Lemma    Procedures/Significant Events:  3/30 echocardiogram: LVEF =25-30%.-  Elevated left ventricular end-diastolic pressure -Left Atrium: moderately dilated 4/3 LEFT/RIGHT catheterization:- LVEF 35 -40% with global hypokinesis   I have personally reviewed and interpreted all radiology studies and my findings are as above.  VENTILATOR SETTINGS:    Cultures 3/30 coronavirus negative 3/30 MRSA by PCR negative   Antimicrobials: Anti-infectives (From admission, onward)   Start     Ordered Stop   09/15/18 1000  azithromycin (ZITHROMAX) tablet 500 mg     09/14/18 1020 09/17/18 0959   09/12/18 0800  cefTRIAXone (ROCEPHIN) 1 g in sodium chloride 0.9 % 100 mL IVPB     09/12/18 0758 09/19/18 0759   09/12/18 0800  azithromycin (ZITHROMAX) 500 mg in sodium chloride 0.9 % 250 mL IVPB  Status:  Discontinued     09/12/18 0758 09/14/18 1020       Devices None   LINES / TUBES:  None    Continuous Infusions: . sodium chloride 10 mL/hr at 09/14/18 1300  . sodium chloride       Objective: Vitals:   09/17/18 0813 09/17/18 1146 09/17/18 1945 09/18/18 0417  BP:  120/65 133/65 (!) 173/66  Pulse:  71 66 60  Resp:      Temp:  98.1 F (36.7 C) 98 F (36.7 C) 97.6 F (36.4 C)  TempSrc:  Oral Oral Oral  SpO2: 91% 90% 91% 91%  Weight:    96.6 kg  Height:        Intake/Output Summary (Last 24 hours) at 09/18/2018 1027 Last data filed at 09/18/2018 0820 Gross per 24 hour  Intake 240 ml  Output 1350 ml  Net -1110 ml   Filed Weights   09/16/18 0432 09/17/18 0441 09/18/18 0417  Weight: 97.7 kg 96.2 kg 96.6 kg   General: A/O x4, no acute respiratory distress Eyes: negative scleral hemorrhage, negative anisocoria, negative icterus ENT: Negative Runny nose, negative gingival bleeding, Neck:  Negative scars, masses, torticollis, lymphadenopathy, JVD  Lungs: Clear to auscultation bilaterally without wheezes or crackles Cardiovascular: Regular rate and rhythm without murmur gallop or rub normal S1 and S2 Abdomen: negative abdominal pain, nondistended, positive soft, bowel sounds, no rebound, no ascites, no appreciable mass Extremities: No significant cyanosis, clubbing, or edema LEFT lower extremity,  RIGHT AKA Skin: Negative rashes, lesions, ulcers Psychiatric:  Negative depression, negative anxiety, negative fatigue, negative mania  Central nervous system:  Cranial nerves II through XII intact, tongue/uvula midline, all extremities muscle strength 5/5, sensation intact throughout, negative dysarthria, negative expressive aphasia, negative receptive aphasia.   Data Reviewed: Care during the described time interval was provided by me .  I have reviewed this patient's available data, including medical history, events of note, physical examination, and all test results as part of my evaluation.   CBC: Recent Labs  Lab 09/14/18 0438 09/15/18 0415 09/16/18 0313 09/16/18 1039 09/16/18 1614 09/17/18 0248 09/18/18 0259  WBC 13.7* 12.8* 9.0  --  13.2* 11.7* 13.5*  NEUTROABS 10.9* 9.6* 8.2*  --   --  10.7*  --  HGB 11.9* 11.2* 12.0 12.2  12.2 12.9 11.9* 12.3  HCT 38.3 35.7* 37.3 36.0  36.0 41.6 38.0 37.4  MCV 87.2 87.3 85.9  --  86.8 86.0 86.2  PLT 252 271 291  --  319 347 348   Basic Metabolic Panel: Recent Labs  Lab 09/14/18 0438 09/15/18 0415 09/16/18 0313 09/16/18 1039 09/16/18 1614 09/17/18 0248 09/18/18 0259  NA 135 136 133* 138  138  --  138 135  K 4.0 3.8 4.3 4.0  4.1  --  4.6 4.8  CL 98 100 97*  --   --  102 99  CO2 27 29 27   --   --  29 29  GLUCOSE 206* 104* 249*  --   --  225* 319*  BUN 23* 24* 31*  --   --  33* 41*  CREATININE 0.99 0.94 0.98  --  1.06* 0.95 1.04*  CALCIUM 8.2* 8.1* 8.5*  --   --  8.5* 8.6*  MG 1.7 2.2 2.2  --   --  2.1 2.1  PHOS  --  2.7  --   --   --   --   --    GFR: Estimated Creatinine  Clearance: 63.7 mL/min (A) (by C-G formula based on SCr of 1.04 mg/dL (H)). Liver Function Tests: Recent Labs  Lab 09/13/18 0436 09/14/18 0438 09/15/18 0415 09/16/18 0313 09/17/18 0248  AST 30 17 15 15 20   ALT 15 13 12 13 17   ALKPHOS 95 88 96 110 99  BILITOT 1.2 0.8 0.6 0.5 0.2*  PROT 5.5* 5.5* 5.4* 5.8* 5.6*  ALBUMIN 2.0* 1.7* 1.7* 1.7* 1.8*   No results for input(s): LIPASE, AMYLASE in the last 168 hours. No results for input(s): AMMONIA in the last 168 hours. Coagulation Profile: No results for input(s): INR, PROTIME in the last 168 hours. Cardiac Enzymes: Recent Labs  Lab 09/12/18 1202 09/12/18 1904 09/13/18 0436 09/14/18 0438 09/15/18 0415 09/16/18 0313 09/17/18 0248  CKTOTAL  --   --  133 48 30* 24* 17*  TROPONINI 14.91* 12.97*  --   --   --   --   --    BNP (last 3 results) No results for input(s): PROBNP in the last 8760 hours. HbA1C: Recent Labs    09/17/18 0248  HGBA1C 9.4*   CBG: Recent Labs  Lab 09/17/18 1616 09/17/18 1946 09/17/18 2358 09/18/18 0419 09/18/18 0802  GLUCAP 315* 182* 327* 285* 155*   Lipid Profile: Recent Labs    09/17/18 0248  CHOL 124  HDL 32*  LDLCALC 80  TRIG 62  CHOLHDL 3.9   Thyroid Function Tests: No results for input(s): TSH, T4TOTAL, FREET4, T3FREE, THYROIDAB in the last 72 hours. Anemia Panel: No results for input(s): VITAMINB12, FOLATE, FERRITIN, TIBC, IRON, RETICCTPCT in the last 72 hours. Urine analysis:    Component Value Date/Time   COLORURINE YELLOW 10/30/2016 1113   APPEARANCEUR CLOUDY (A) 10/30/2016 1113   LABSPEC 1.024 10/30/2016 1113   PHURINE 8.0 10/30/2016 1113   GLUCOSEU >=500 (A) 10/30/2016 1113   HGBUR NEGATIVE 10/30/2016 1113   BILIRUBINUR NEGATIVE 10/30/2016 1113   KETONESUR 20 (A) 10/30/2016 1113   PROTEINUR >=300 (A) 10/30/2016 1113   UROBILINOGEN 1.0 12/16/2011 1015   NITRITE NEGATIVE 10/30/2016 1113   LEUKOCYTESUR NEGATIVE 10/30/2016 1113   Sepsis Labs:  @LABRCNTIP (procalcitonin:4,lacticidven:4)  ) Recent Results (from the past 240 hour(s))  MRSA PCR Screening     Status: None   Collection Time: 09/12/18  5:48 AM  Result Value Ref Range Status   MRSA by PCR NEGATIVE NEGATIVE Final    Comment:        The GeneXpert MRSA Assay (FDA approved for NASAL specimens only), is one component of a comprehensive MRSA colonization surveillance program. It is not intended to diagnose MRSA infection nor to guide or monitor treatment for MRSA infections. Performed at [facility], [address], [place], [place] [address]   Novel Coronavirus, NAA (hospital order; send-out to ref lab)     Status: None   Collection Time: 09/12/18  8:33 AM  Result Value Ref Range Status   SARS-CoV-2, NAA NOT DETECTED NOT DETECTED Final    Comment: Negative (Not Detected) results do not exclude infection caused by SARS CoV 2 and should not be used as the sole basis for treatment or other patient management decisions. Optimum specimen types and timing for peak viral levels during infections caused  by SARS CoV 2 have not been determined. Collection of multiple specimens (types and time points) from the same patient may be necessary to detect the virus. Improper specimen collection and handling, sequence variability underlying assay primers and or probes, or the presence of organisms in  quantities less than the limit of detection of the assay may lead to false negative results. Positive and negative predictive values of testing are highly dependent on prevalence. False negative results are more likely when prevalence of disease is high. (NOTE) The expected result is Negative (Not Detected). The SARS CoV 2 test is intended for the presumptive qualitative  detection of nucleic acid from SARS CoV 2 in upper and lower  respir atory specimens. Testing methodology is real time RT PCR. Test results must be correlated with clinical presentation and  evaluated in the  context of other laboratory and epidemiologic data.  Test performance can be affected because the epidemiology and  clinical spectrum of infection caused by SARS CoV 2 is not fully  known. For example, the optimum types of specimens to collect and  when during the course of infection these specimens are most likely  to contain detectable viral RNA may not be known. This test has not been Food and Drug Administration (FDA) cleared or  approved and has been authorized by FDA under an Emergency Use  Authorization (EUA). The test is only authorized for the duration of  the declaration that circumstances exist justifying the authorization  of emergency use of in vitro diagnostic tests for detection and or  diagnosis of SARS CoV 2 under Section 564(b)(1) of the Act, 21 U.S.C.  section [phone] 3(b)(1), unless the authorization is terminated or   revoked sooner. [facility] is certified under the  Clinical Laboratory Improvement Amendments of 1988 (CLIA), 42 U.S.C.  section [phone], to perform high complexity tests. Performed at [facility] CLIA [id number] [address], [address], [address], [place], [place] [address] Laboratory Director: Turner Daniels, MD    Coronavirus Source NASOPHARYNGEAL  Final    Comment: Performed at [facility], [address] [place], [place] [address]         Radiology Studies: No results found.      Scheduled Meds: . aspirin EC  81 mg Oral Daily  . atorvastatin  80 mg Oral q1800  . Chlorhexidine Gluconate Cloth  6 each Topical Daily  . DULoxetine  60 mg Oral BID  . enoxaparin (LOVENOX) injection  40 mg Subcutaneous Q24H  . furosemide  20 mg Oral  Daily  . gabapentin  400 mg Oral TID  . insulin aspart  0-20 Units Subcutaneous Q4H  . insulin aspart  4 Units Subcutaneous TID WC  . insulin glargine  40 Units Subcutaneous Daily  . losartan  50 mg Oral Daily  . methylPREDNISolone (SOLU-MEDROL) injection  60 mg  Intravenous Q24H  . metoprolol succinate  50 mg Oral Daily  . nicotine  21 mg Transdermal Daily  . sodium chloride flush  3 mL Intravenous Q12H  . sodium chloride flush  3 mL Intravenous Q12H  . spironolactone  12.5 mg Oral Daily   Continuous Infusions: . sodium chloride 10 mL/hr at 09/14/18 1300  . sodium chloride       LOS: 6 days    Time spent: 40 minutes    Cordera Stineman, Roselind Messier, MD [facility] Pager [phone]   If 7PM-7AM, please contact night-coverage [email or web address] Password [facility] 09/18/2018, 10:27 AM

## 2018-09-19 ENCOUNTER — Encounter (HOSPITAL_COMMUNITY): Payer: Self-pay | Admitting: Cardiology

## 2018-09-19 LAB — CBC
HCT: 41.6 % (ref 36.0–46.0)
Hemoglobin: 12.8 g/dL (ref 12.0–15.0)
MCH: 26.8 pg (ref 26.0–34.0)
MCHC: 30.8 g/dL (ref 30.0–36.0)
MCV: 87.2 fL (ref 80.0–100.0)
Platelets: 419 10*3/uL — ABNORMAL HIGH (ref 150–400)
RBC: 4.77 MIL/uL (ref 3.87–5.11)
RDW: 15 % (ref 11.5–15.5)
WBC: 16.8 10*3/uL — ABNORMAL HIGH (ref 4.0–10.5)
nRBC: 0 % (ref 0.0–0.2)

## 2018-09-19 LAB — GLUCOSE, CAPILLARY
Glucose-Capillary: 110 mg/dL — ABNORMAL HIGH (ref 70–99)
Glucose-Capillary: 154 mg/dL — ABNORMAL HIGH (ref 70–99)
Glucose-Capillary: 93 mg/dL (ref 70–99)
Glucose-Capillary: 95 mg/dL (ref 70–99)

## 2018-09-19 LAB — BASIC METABOLIC PANEL
Anion gap: 8 (ref 5–15)
BUN: 39 mg/dL — ABNORMAL HIGH (ref 6–20)
CO2: 29 mmol/L (ref 22–32)
Calcium: 8.6 mg/dL — ABNORMAL LOW (ref 8.9–10.3)
Chloride: 102 mmol/L (ref 98–111)
Creatinine, Ser: 1.06 mg/dL — ABNORMAL HIGH (ref 0.44–1.00)
GFR calc Af Amer: >60 mL/min (ref >60)
GFR calc non Af Amer: 57 mL/min — ABNORMAL LOW (ref >60)
Glucose, Bld: 100 mg/dL — ABNORMAL HIGH (ref 70–99)
Potassium: 4.9 mmol/L (ref 3.5–5.1)
Sodium: 139 mmol/L (ref 135–145)

## 2018-09-19 LAB — MAGNESIUM: Magnesium: 2.2 mg/dL (ref 1.7–2.4)

## 2018-09-19 MED ORDER — METOPROLOL SUCCINATE ER 50 MG PO TB24: 50.0000 mg | ORAL_TABLET | Freq: Every day | ORAL | 0 refills | Status: DC

## 2018-09-19 MED ORDER — ATORVASTATIN CALCIUM 80 MG PO TABS: 80.0000 mg | ORAL_TABLET | Freq: Every day | ORAL | 0 refills | Status: DC

## 2018-09-19 MED ORDER — FUROSEMIDE 20 MG PO TABS: 20.0000 mg | ORAL_TABLET | Freq: Every day | ORAL | 0 refills | Status: DC

## 2018-09-19 MED ORDER — LOSARTAN POTASSIUM 50 MG PO TABS: 50.0000 mg | ORAL_TABLET | Freq: Every day | ORAL | 0 refills | Status: DC

## 2018-09-19 MED ORDER — INSULIN ASPART 100 UNIT/ML ~~LOC~~ SOLN: 10.0000 [IU] | Freq: Three times a day (TID) | SUBCUTANEOUS | 0 refills | Status: DC

## 2018-09-19 MED ORDER — SPIRONOLACTONE 25 MG PO TABS: 12.5000 mg | ORAL_TABLET | Freq: Every day | ORAL | 0 refills | Status: DC

## 2018-09-19 NOTE — Discharge Summary (Signed)
DISCHARGE SUMMARY  Mindy Love  MR#: [id number]  DOB:May 06, 1956  Date of Admission: 09/12/2018 Date of Discharge: 09/19/2018  Attending Physician:Jeffrey Silvestre Gunner, MD  Patient's XBL:TJQZ, Mindy Love., MD  Consults: Cardiology   Disposition: D/C home   Follow-up Appts: Follow-up Information    [facility] Follow up.   Why:  Registered Nurse       [name]., MD. Schedule an appointment as soon as possible for a visit in 1 week(s).   Specialty:  Family Medicine Why:  Schedule follow-up in 1 week with Dr. Renae Fickle for potassium/renal function panel per cardiology. Contact information: [address] [place] [address] [phone]        Revankar, Aundra Dubin, MD Follow up.   Specialty:  Cardiology Why:  Office will call with arrangement for follow up appt. Contact information: [address] 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 Fib vs PAT Uncontrolled DM2 HLD R AKA  Initial presentation: 63yo w/ a hx of obesity, anxiety, depression, DM2, CAD, HLD, and tobacco abuse who was transferredto the [facility] ED for a COVID r/o from [facility] where she presented with complaints of fever and SOB. Hertroponin went from 0.2 > 9 in the ED.  Hospital Course:  Acute respiratory failure with hypoxia - Pneumonia  Has completed a course of abx tx - sx resolved at time of d/c - sats 89-95% on RA at time of d/c - RULED OUT for SARS-CoV2   CAD - NSTEMI cardiac catheterization noted  all grafts patent - continue medical therapy with aspirin and statin - continue beta-blocker - given elevated troponin Cards added Plavix 75 mg daily  Ischemic cardiomyopathy Per Cards:  "LV function severely reduced at time of TTE, and moderately reduced at catheterization.  Continue beta-blocker.  Discontinue hydralazine/nitrates.  Add ARB.  Could transition to [facility] as an outpatient.  Patient will need follow-up echocardiogram in 3 months.  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  She will need potassium and renal function checked 1 week after discharge."  Paroxysmal A. Fib vs PAT Cont BB and aspirin per Cards recommendation   Uncontrolled DM2 4/4 A1c 11.9 - CBG well controlled at time of d/c   HLD Cont Lipitor 80 mg daily  RIGHT AKA F/u at [facility]   Allergies as of 09/19/2018      Reactions   Codeine Hives   Jardiance [empagliflozin] Other (See Comments)   Makes blood sugar drop   Sulfa Antibiotics Swelling   Tramadol Hives      Medication List    TAKE these medications   aspirin EC 81 MG tablet Take 81 mg by mouth daily after breakfast. States LD 12/16/11   atorvastatin 80 MG tablet Commonly known as:  LIPITOR Take  1 tablet (80 mg total) by mouth daily at 6 PM. What changed:    medication strength  how much to take  when to take this   brimonidine 0.2 % ophthalmic solution Commonly known as:  ALPHAGAN Place 1 drop into the left eye 3 (three) times daily.   clopidogrel 75 MG tablet Commonly known as:  PLAVIX Take 75 mg by mouth daily.   dicyclomine 20 MG tablet Commonly known as:  BENTYL Take 20 mg by mouth every 6 (six) hours.   DULoxetine 60 MG capsule  Commonly known as:  CYMBALTA Take 60 mg by mouth 2 (two) times daily.   furosemide 20 MG tablet Commonly known as:  LASIX Take 1 tablet (20 mg total) by mouth daily. Start taking on:  September 20, 2018   gabapentin 400 MG capsule Commonly known as:  NEURONTIN Take 400 mg by mouth 3 (three) times daily.   insulin glargine 100 UNIT/ML injection Commonly known as:  LANTUS Inject 40 Units into the skin at bedtime.   losartan 50 MG tablet Commonly known as:  COZAAR Take 1 tablet (50 mg total) by mouth daily. Start taking on:  September 20, 2018   metoprolol succinate 50 MG 24 hr tablet Commonly known as:  TOPROL-XL Take 1 tablet (50 mg total) by mouth daily. Take with or immediately following a meal. Start taking on:  September 20, 2018   nitroGLYCERIN 0.4 MG SL tablet Commonly known as:  NITROSTAT Place 0.4 mg under the tongue every 5 (five) minutes as needed. Chest pain   NovoLOG 100 UNIT/ML injection Generic drug:  insulin aspart Inject 1-12 Units into the skin See admin instructions. Sliding scale per chart for each meal What changed:  Another medication with the same name was added. Make sure you understand how and when to take each.   insulin aspart 100 UNIT/ML injection Commonly known as:  novoLOG Inject 10 Units into the skin 3 (three) times daily with meals. What changed:  You were already taking a medication with the same name, and this prescription was added. Make sure you understand how and when to take each.   Oxycodone HCl 10 MG Tabs Take 10 mg by mouth 5 (five) times daily.   spironolactone 25 MG tablet Commonly known as:  ALDACTONE Take 0.5 tablets (12.5 mg total) by mouth daily. Start taking on:  September 20, 2018   timolol 0.5 % ophthalmic solution Commonly known as:  TIMOPTIC Apply 1 drop to eye 2 (two) times daily.       Day of Discharge BP (!) 153/57 (BP Location: Left Arm)   Pulse (!) 53   Temp 97.9 F (36.6 C) (Oral)   Resp 18   Ht  (1.6 m)   Wt 92  kg   SpO2 90%   BMI 35.93 kg/m   Physical Exam: General: No acute respiratory distress Lungs: Clear to auscultation bilaterally without wheezes or crackles Cardiovascular: Regular rate and rhythm without murmur gallop or rub normal S1 and S2 Abdomen: Nontender, nondistended, soft, bowel sounds positive, no rebound, no ascites, no appreciable mass Extremities: No significant cyanosis, clubbing, or edema bilateral lower extremities  Basic Metabolic Panel: Recent Labs  Lab 09/15/18 0415 09/16/18 0313 09/16/18 1039 09/16/18 1614 09/17/18 0248 09/18/18 0259 09/19/18 0300  NA 136 133* 138  138  --  138 135 139  K 3.8 4.3 4.0  4.1  --  4.6 4.8 4.9  CL 100 97*  --   --  102  99 102  CO2 29 27  --   --  29 29 29   GLUCOSE 104* 249*  --   --  225* 319* 100*  BUN 24* 31*  --   --  33* 41* 39*  CREATININE 0.94 0.98  --  1.06* 0.95 1.04* 1.06*  CALCIUM 8.1* 8.5*  --   --  8.5* 8.6* 8.6*  MG 2.2 2.2  --   --  2.1 2.1 2.2  PHOS 2.7  --   --   --   --   --   --     Liver Function Tests: Recent Labs  Lab 09/13/18 0436 09/14/18 0438 09/15/18 0415 09/16/18 0313 09/17/18 0248  AST 30 17 15 15 20   ALT 15 13 12 13 17   ALKPHOS 95 88 96 110 99  BILITOT 1.2 0.8 0.6 0.5 0.2*  PROT 5.5* 5.5* 5.4* 5.8* 5.6*  ALBUMIN 2.0* 1.7* 1.7* 1.7* 1.8*   CBC: Recent Labs  Lab 09/14/18 0438 09/15/18 0415 09/16/18 0313 09/16/18 1039 09/16/18 1614 09/17/18 0248 09/18/18 0259 09/19/18 0300  WBC 13.7* 12.8* 9.0  --  13.2* 11.7* 13.5* 16.8*  NEUTROABS 10.9* 9.6* 8.2*  --   --  10.7*  --   --   HGB 11.9* 11.2* 12.0 12.2  12.2 12.9 11.9* 12.3 12.8  HCT 38.3 35.7* 37.3 36.0  36.0 41.6 38.0 37.4 41.6  MCV 87.2 87.3 85.9  --  86.8 86.0 86.2 87.2  PLT 252 271 291  --  319 347 348 419*    Cardiac Enzymes: Recent Labs  Lab 09/12/18 1904 09/13/18 0436 09/14/18 0438 09/15/18 0415 09/16/18 0313 09/17/18 0248  CKTOTAL  --  133 48 30* 24* 17*  TROPONINI 12.97*  --   --   --   --   --     CBG:  Recent Labs  Lab 09/18/18 2012 09/19/18 0014 09/19/18 0408 09/19/18 0750 09/19/18 1125  GLUCAP 147* 93 95 154* 110*    Recent Results (from the past 240 hour(s))  MRSA PCR Screening     Status: None   Collection Time: 09/12/18  5:48 AM  Result Value Ref Range Status   MRSA by PCR NEGATIVE NEGATIVE Final    Comment:        The GeneXpert MRSA Assay (FDA approved for NASAL specimens only), is one component of a comprehensive MRSA colonization surveillance program. It is not intended to diagnose MRSA infection nor to guide or monitor treatment for MRSA infections. Performed at [facility], [address] [place], [place] [address]   Novel Coronavirus, NAA (hospital order; send-out to ref lab)     Status: None   Collection Time: 09/12/18  8:33 AM  Result Value Ref Range Status   SARS-CoV-2, NAA NOT DETECTED NOT DETECTED Final    Comment: Negative (Not Detected) results do not exclude infection caused by SARS CoV 2 and should not be used as the sole basis for treatment or other patient management decisions. Optimum specimen types and timing for peak viral levels during infections caused  by SARS CoV 2 have not been determined. Collection of multiple specimens (types and time points) from the same patient may be necessary to detect the virus. Improper specimen collection and handling, sequence variability underlying assay primers and or probes, or the presence of organisms in  quantities less than the limit of detection of the assay may lead to false negative results. Positive and negative predictive values of testing are highly dependent on  prevalence. False negative results are more likely when prevalence of disease is high. (NOTE) The expected result is Negative (Not Detected). The SARS CoV 2 test is intended for the presumptive qualitative  detection of nucleic acid from SARS CoV 2 in upper and lower  respir atory specimens. Testing methodology is real time RT PCR.  Test results must be correlated with clinical presentation and  evaluated in the context of other laboratory and epidemiologic data.  Test performance can be affected because the epidemiology and  clinical spectrum of infection caused by SARS CoV 2 is not fully  known. For example, the optimum types of specimens to collect and  when during the course of infection these specimens are most likely  to contain detectable viral RNA may not be known. This test has not been Food and Drug Administration (FDA) cleared or  approved and has been authorized by FDA under an Emergency Use  Authorization (EUA). The test is only authorized for the duration of  the declaration that circumstances exist justifying the authorization  of emergency use of in vitro diagnostic tests for detection and or  diagnosis of SARS CoV 2 under Section 564(b)(1) of the Act, 21 U.S.C.  section [phone] 3(b)(1), unless the authorization is terminated or   revoked sooner. [facility] is certified under the  Clinical Laboratory Improvement Amendments of 1988 (CLIA), 42 U.S.C.  section 229-095-4971263a, to perform high complexity tests. Performed at [facility] CLIA [id number] [address], [address], [address], [place], [place] [id number] Laboratory Director: Turner DanielsJoseph H. Willman, MD    Coronavirus Source NASOPHARYNGEAL  Final    Comment: Performed at [facility], [address] [place], [place] [address]     Time spent in discharge (includes decision making & examination of pt): 35 minutes  09/19/2018, 12:19 PM   Lonia BloodJeffrey T. McClung, MD [facility] Office  [phone]

## 2018-09-19 NOTE — Progress Notes (Signed)
PT Cancellation Note  Patient Details Name: Mindy Love MRN: [id number] DOB: [birth year]   Cancelled Treatment:      Pt refused PT as she is already dressed and just waiting for her ride to arrive because she has been discharged. PT signing off.    Army Fossa 09/19/2018, 1:28 PM

## 2018-09-19 NOTE — Progress Notes (Signed)
    Message sent to [place] office to arrange for outpatient follow up in the next 8 weeks per Dr. Ludwig Clarks note. Information placed in AVS. Plan for discharge via primary service within the next 24 hours.   SignedLaverda Page, NP-C 09/19/2018, 10:39 AM Pager: [phone]

## 2018-09-19 NOTE — Discharge Instructions (Signed)
Heart Failure °Heart failure is a condition in which the heart has trouble pumping blood because it has become weak or stiff. This means that the heart does not pump blood efficiently for the body to work well. For some people with heart failure, fluid may back up into the lungs and there may be swelling (edema) in the lower legs. Heart failure is usually a long-term (chronic) condition. It is important for you to take good care of yourself and follow the treatment plan from your health care provider. °What are the causes? °This condition is caused by some health problems, including: °· High blood pressure (hypertension). Hypertension causes the heart muscle to work harder than normal. High blood pressure eventually causes the heart to become stiff and weak. °· Coronary artery disease (CAD). CAD is the buildup of cholesterol and fat (plaques) in the arteries of the heart. °· Heart attack (myocardial infarction). Injured tissue, which is caused by the heart attack, does not contract as well and the heart's ability to pump blood is weakened. °· Abnormal heart valves. When the heart valves do not open and close properly, the heart muscle must pump harder to keep the blood flowing. °· Heart muscle disease (cardiomyopathy or myocarditis). Heart muscle disease is damage to the heart muscle from a variety of causes, such as drug or alcohol abuse, infections, or unknown causes. These can increase the risk of heart failure. °· Lung disease. When the lungs do not work properly, the heart must work harder. °What increases the risk? °Risk of heart failure increases as a person ages. This condition is also more likely to develop in people who: °· Are overweight. °· Are female. °· Smoke or chew tobacco. °· Abuse alcohol or illegal drugs. °· Have taken medicines that can damage the heart, such as chemotherapy drugs. °· Have diabetes. °? High blood sugar (glucose) is associated with high fat (lipid) levels in the blood. °? Diabetes  can also damage tiny blood vessels that carry nutrients to the heart muscle. °· Have abnormal heart rhythms. °· Have thyroid problems. °· Have low blood counts (anemia). °What are the signs or symptoms? °Symptoms of this condition include: °· Shortness of breath with activity, such as when climbing stairs. °· Persistent cough. °· Swelling of the feet, ankles, legs, or abdomen. °· Unexplained weight gain. °· Difficulty breathing when lying flat (orthopnea). °· Waking from sleep because of the need to sit up and get more air. °· Rapid heartbeat. °· Fatigue and loss of energy. °· Feeling light-headed, dizzy, or close to fainting. °· Loss of appetite. °· Nausea. °· Increased urination during the night (nocturia). °· Confusion. °How is this diagnosed? °This condition is diagnosed based on: °· Medical history, symptoms, and a physical exam. °· Diagnostic tests, which may include: °? Echocardiogram. °? Electrocardiogram (ECG). °? Chest X-ray. °? Blood tests. °? Exercise stress test. °? Radionuclide scans. °? Cardiac catheterization and angiogram. °How is this treated? °Treatment for this condition is aimed at managing the symptoms of heart failure. Medicines, behavioral changes, or other treatments may be necessary to treat heart failure. °Medicines °These may include: °· Angiotensin-converting enzyme (ACE) inhibitors. This type of medicine blocks the effects of a blood protein called angiotensin-converting enzyme. ACE inhibitors relax (dilate) the blood vessels and help to lower blood pressure. °· Angiotensin receptor blockers (ARBs). This type of medicine blocks the actions of a blood protein called angiotensin. ARBs dilate the blood vessels and help to lower blood pressure. °· Water pills (diuretics). Diuretics cause   the kidneys to remove salt and water from the blood. The extra fluid is removed through urination, leaving a lower volume of blood that the heart has to pump. °· Beta blockers. These improve heart muscle  strength and they prevent the heart from beating too quickly. °· Digoxin. This increases the force of the heartbeat. °Healthy behavior changes °These may include: °· Reaching and maintaining a healthy weight. °· Stopping smoking or chewing tobacco. °· Eating heart-healthy foods. °· Limiting or avoiding alcohol. °· Stopping use of street drugs (illegal drugs). °· Physical activity. °Other treatments °These may include: °· Surgery to open blocked coronary arteries or repair damaged heart valves. °· Placement of a biventricular pacemaker to improve heart muscle function (cardiac resynchronization therapy). This device paces both the right ventricle and left ventricle. °· Placement of a device to treat serious abnormal heart rhythms (implantable cardioverter defibrillator, or ICD). °· Placement of a device to improve the pumping ability of the heart (left ventricular assist device, or LVAD). °· Heart transplant. This can cure heart failure, and it is considered for certain patients who do not improve with other therapies. °Follow these instructions at home: °Medicines °· Take over-the-counter and prescription medicines only as told by your health care provider. Medicines are important in reducing the workload of your heart, slowing the progression of heart failure, and improving your symptoms. °? Do not stop taking your medicine unless your health care provider told you to do that. °? Do not skip any dose of medicine. °? Refill your prescriptions before you run out of medicine. You need your medicines every day. °Eating and drinking ° °· Eat heart-healthy foods. Talk with a dietitian to make an eating plan that is right for you. °? Choose foods that contain no trans fat and are low in saturated fat and cholesterol. Healthy choices include fresh or frozen fruits and vegetables, fish, lean meats, legumes, fat-free or low-fat dairy products, and whole-grain or high-fiber foods. °? Limit salt (sodium) if directed by your  health care provider. Sodium restriction may reduce symptoms of heart failure. Ask a dietitian to recommend heart-healthy seasonings. °? Use healthy cooking methods instead of frying. Healthy methods include roasting, grilling, broiling, baking, poaching, steaming, and stir-frying. °· Limit your fluid intake if directed by your health care provider. Fluid restriction may reduce symptoms of heart failure. °Lifestyle ° °· Stop smoking or using chewing tobacco. Nicotine and tobacco can damage your heart and your blood vessels. Do not use nicotine gum or patches before talking to your health care provider. °· Limit alcohol intake to no more than 1 drink per day for non-pregnant women and 2 drinks per day for men. One drink equals 12 oz of beer, 5 oz of wine, or 1½ oz of hard liquor. °? Drinking more than that is harmful to your heart. Tell your health care provider if you drink alcohol several times a week. °? Talk with your health care provider about whether any level of alcohol use is safe for you. °? If your heart has already been damaged by alcohol or you have severe heart failure, drinking alcohol should be stopped completely. °· Stop use of illegal drugs. °· Lose weight if directed by your health care provider. Weight loss may reduce symptoms of heart failure. °· Do moderate physical activity if directed by your health care provider. People who are elderly and people with severe heart failure should consult with a health care provider for physical activity recommendations. °Monitor important information ° °· Weigh   yourself every day. Keeping track of your weight daily helps you to notice excess fluid sooner. °? Weigh yourself every morning after you urinate and before you eat breakfast. °? Wear the same amount of clothing each time you weigh yourself. °? Record your daily weight. Provide your health care provider with your weight record. °· Monitor and record your blood pressure as told by your health care  provider. °· Check your pulse as told by your health care provider. °Dealing with extreme temperatures °· If the weather is extremely hot: °? Avoid vigorous physical activity. °? Use air conditioning or fans or seek a cooler location. °? Avoid caffeine and alcohol. °? Wear loose-fitting, lightweight, and light-colored clothing. °· If the weather is extremely cold: °? Avoid vigorous physical activity. °? Layer your clothes. °? Wear mittens or gloves, a hat, and a scarf when you go outside. °? Avoid alcohol. °General instructions °· Manage other health conditions such as hypertension, diabetes, thyroid disease, or abnormal heart rhythms as told by your health care provider. °· Learn to manage stress. If you need help to do this, ask your health care provider. °· Plan rest periods when fatigued. °· Get ongoing education and support as needed. °· Participate in or seek rehabilitation as needed to maintain or improve independence and quality of life. °· Stay up to date with immunizations. Keeping current on pneumococcal and influenza immunizations is especially important to prevent respiratory infections. °· Keep all follow-up visits as told by your health care provider. This is important. °Contact a health care provider if: °· You have a rapid weight gain. °· You have increasing shortness of breath that is unusual for you. °· You are unable to participate in your usual physical activities. °· You tire easily. °· You cough more than normal, especially with physical activity. °· You have any swelling or more swelling in areas such as your hands, feet, ankles, or abdomen. °· You are unable to sleep because it is hard to breathe. °· You feel like your heart is beating quickly (palpitations). °· You become dizzy or light-headed when you stand up. °Get help right away if: °· You have difficulty breathing. °· You notice or your family notices a change in your awareness, such as having trouble staying awake or having difficulty  with concentration. °· You have pain or discomfort in your chest. °· You have an episode of fainting (syncope). °This information is not intended to replace advice given to you by your health care provider. Make sure you discuss any questions you have with your health care provider. °Document Released: 06/01/2005 Document Revised: 04/30/2017 Document Reviewed: 12/25/2015 °Elsevier Interactive Patient Education © 2019 [employer] ° °

## 2018-09-20 ENCOUNTER — Telehealth: Payer: Self-pay

## 2018-09-20 NOTE — Telephone Encounter (Signed)
Patient gave verbal consent to telehealth visit scheduled on 09/29/18. She does not have a cell phone.   YOUR CARDIOLOGY TEAM HAS ARRANGED FOR AN E-VISIT FOR YOUR APPOINTMENT - PLEASE REVIEW IMPORTANT INFORMATION BELOW SEVERAL DAYS PRIOR TO YOUR APPOINTMENT  Due to the recent COVID-19 pandemic, we are transitioning in-person office visits to tele-medicine visits in an effort to decrease unnecessary exposure to our patients and staff. Medicare and most insurances are covering these visits without a copay needed. We also encourage you to sign up for MyChart if you have not already done so. You will need a smartphone if possible. For patients that do not have this, we can still complete the visit using a regular telephone but do prefer a smartphone to enable video when possible. You may have a close family member that lives with you that can help. If possible, we also ask that you have a blood pressure cuff and scale at home to measure your blood pressure, heart rate and weight prior to your scheduled appointment. Patients with clinical needs that need an in-person evaluation and testing will still be able to come to the office if absolutely necessary. If you have any questions, feel free to call our office.  THE DAY OF YOUR APPOINTMENT  Approximately 15 minutes prior to your scheduled appointment, you will receive a telephone call from one of [facility] team - your caller ID may say "Unknown caller."  Our staff will confirm medications, vital signs for the day and any symptoms you may be experiencing. Please have this information available prior to the time of visit start. It may also be helpful for you to have a pad of paper and pen handy for any instructions given during your visit. They will also walk you through joining the WebEx smartphone meeting if this is a video visit.    CONSENT FOR TELE-HEALTH VISIT - PLEASE REVIEW  I hereby voluntarily request, consent and authorize [facility] and its  employed or contracted physicians, physician assistants, nurse practitioners or other licensed health care professionals (the Practitioner), to provide me with telemedicine health care services (the "Services") as deemed necessary by the treating Practitioner. I acknowledge and consent to receive the Services by the Practitioner via telemedicine. I understand that the telemedicine visit will involve communicating with the Practitioner through live audiovisual communication technology and the disclosure of certain medical information by electronic transmission. I acknowledge that I have been given the opportunity to request an in-person assessment or other available alternative prior to the telemedicine visit and am voluntarily participating in the telemedicine visit.  I understand that I have the right to withhold or withdraw my consent to the use of telemedicine in the course of my care at any time, without affecting my right to future care or treatment, and that the Practitioner or I may terminate the telemedicine visit at any time. I understand that I have the right to inspect all information obtained and/or recorded in the course of the telemedicine visit and may receive copies of available information for a reasonable fee.  I understand that some of the potential risks of receiving the Services via telemedicine include:  Marland Kitchen Delay or interruption in medical evaluation due to technological equipment failure or disruption; . Information transmitted may not be sufficient (e.g. poor resolution of images) to allow for appropriate medical decision making by the Practitioner; and/or  . In rare instances, security protocols could fail, causing a breach of personal health information.  Furthermore, I acknowledge that it  is my responsibility to provide information about my medical history, conditions and care that is complete and accurate to the best of my ability. I acknowledge that Practitioner's advice,  recommendations, and/or decision may be based on factors not within their control, such as incomplete or inaccurate data provided by me or distortions of diagnostic images or specimens that may result from electronic transmissions. I understand that the practice of medicine is not an exact science and that Practitioner makes no warranties or guarantees regarding treatment outcomes. I acknowledge that I will receive a copy of this consent concurrently upon execution via email to the email address I last provided but may also request a printed copy by calling the office of [facility].    I understand that my insurance will be billed for this visit.   I have read or had this consent read to me. . I understand the contents of this consent, which adequately explains the benefits and risks of the Services being provided via telemedicine.  . I have been provided ample opportunity to ask questions regarding this consent and the Services and have had my questions answered to my satisfaction. . I give my informed consent for the services to be provided through the use of telemedicine in my medical care  By participating in this telemedicine visit I agree to the above.

## 2018-09-26 DIAGNOSIS — Z79891 Long term (current) use of opiate analgesic: Secondary | ICD-10-CM | POA: Diagnosis not present

## 2018-09-26 DIAGNOSIS — J449 Chronic obstructive pulmonary disease, unspecified: Secondary | ICD-10-CM | POA: Diagnosis not present

## 2018-09-26 DIAGNOSIS — G894 Chronic pain syndrome: Secondary | ICD-10-CM | POA: Diagnosis not present

## 2018-09-26 DIAGNOSIS — M5416 Radiculopathy, lumbar region: Secondary | ICD-10-CM | POA: Diagnosis not present

## 2018-09-26 DIAGNOSIS — I2510 Atherosclerotic heart disease of native coronary artery without angina pectoris: Secondary | ICD-10-CM | POA: Diagnosis not present

## 2018-09-26 DIAGNOSIS — M961 Postlaminectomy syndrome, not elsewhere classified: Secondary | ICD-10-CM | POA: Diagnosis not present

## 2018-09-26 DIAGNOSIS — E119 Type 2 diabetes mellitus without complications: Secondary | ICD-10-CM | POA: Diagnosis not present

## 2018-09-26 DIAGNOSIS — M47816 Spondylosis without myelopathy or radiculopathy, lumbar region: Secondary | ICD-10-CM | POA: Diagnosis not present

## 2018-09-27 DIAGNOSIS — E785 Hyperlipidemia, unspecified: Secondary | ICD-10-CM | POA: Insufficient documentation

## 2018-09-27 DIAGNOSIS — Z951 Presence of aortocoronary bypass graft: Secondary | ICD-10-CM | POA: Insufficient documentation

## 2018-09-29 ENCOUNTER — Telehealth (INDEPENDENT_AMBULATORY_CARE_PROVIDER_SITE_OTHER): Payer: Medicare HMO | Admitting: Cardiology

## 2018-09-29 ENCOUNTER — Other Ambulatory Visit: Payer: Self-pay

## 2018-09-29 ENCOUNTER — Encounter: Payer: Self-pay | Admitting: Cardiology

## 2018-09-29 VITALS — BP 159/69 | Ht 62.5 in | Wt 220.0 lb

## 2018-09-29 DIAGNOSIS — F17210 Nicotine dependence, cigarettes, uncomplicated: Secondary | ICD-10-CM | POA: Diagnosis not present

## 2018-09-29 DIAGNOSIS — I428 Other cardiomyopathies: Secondary | ICD-10-CM

## 2018-09-29 DIAGNOSIS — E119 Type 2 diabetes mellitus without complications: Secondary | ICD-10-CM

## 2018-09-29 DIAGNOSIS — Z951 Presence of aortocoronary bypass graft: Secondary | ICD-10-CM

## 2018-09-29 DIAGNOSIS — J431 Panlobular emphysema: Secondary | ICD-10-CM

## 2018-09-29 DIAGNOSIS — I2510 Atherosclerotic heart disease of native coronary artery without angina pectoris: Secondary | ICD-10-CM

## 2018-09-29 DIAGNOSIS — I10 Essential (primary) hypertension: Secondary | ICD-10-CM

## 2018-09-29 DIAGNOSIS — R69 Illness, unspecified: Secondary | ICD-10-CM | POA: Diagnosis not present

## 2018-09-29 DIAGNOSIS — E1159 Type 2 diabetes mellitus with other circulatory complications: Secondary | ICD-10-CM

## 2018-09-29 DIAGNOSIS — Z720 Tobacco use: Secondary | ICD-10-CM

## 2018-09-29 DIAGNOSIS — J432 Centrilobular emphysema: Secondary | ICD-10-CM

## 2018-09-29 NOTE — Patient Instructions (Signed)

## 2018-09-29 NOTE — Progress Notes (Signed)
Virtual Visit via Telephone Note   This visit type was conducted due to national recommendations for restrictions regarding the COVID-19 Pandemic (e.g. social distancing) in an effort to limit this patient's exposure and mitigate transmission in our community.  Due to her co-morbid illnesses, this patient is at least at moderate risk for complications without adequate follow up.  This format is felt to be most appropriate for this patient at this time.  The patient did not have access to video technology/had technical difficulties with video requiring transitioning to audio format only (telephone).  All issues noted in this document were discussed and addressed.  No physical exam could be performed with this format.  Please refer to the patient's chart for her  consent to telehealth for [facility]   Evaluation Performed:  Follow-up visit  Date:  09/29/2018   ID:  Mindy, Love November 27, 1955, MRN [id number]  Patient Location: Home Provider Location: Home  PCP:  Kendell Bane, MD  Cardiologist:  No primary care provider on file.  Electrophysiologist:  None   Chief Complaint:  CAD follow up  History of Present Illness:    Mindy Love is a 63 y.o. female with past medical history of coronary artery disease, essential hypertension, diabetes mellitus.  She essentially also has peripheral vascular disease.  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  Follow-up appointment.  She denies any chest pain orthopnea or PND.  At the time of my evaluation, the patient is alert awake oriented and in no distress.  The patient does not have symptoms concerning for COVID-19 infection (fever, chills, cough, or new shortness of breath).    Past Medical History:  Diagnosis Date  . Anxiety   . CAD, multiple vessel 03/2011   Status post CABG x4 - LIMA-LAD, SVG-DIAG, SVG-OM, SVG-dRCA.([facility])  . Chronic diastolic CHF (congestive heart failure), NYHA class 2  (HCC)    With mild systolic dysfunction by last echo  . Depression   . Diabetes mellitus   . Frequency   . GERD (gastroesophageal reflux disease)   . Herniated disc   . Hyperlipidemia   . Myocardial infarction (HCC) 10/12   OV Dr Tomie China  12/22/11 with clearance note on chart , EKG  11/12 on chart  . Paroxysmal atrial fibrillation (HCC)    Documented during COPD exacerbation hospitalization in 2018   Past Surgical History:  Procedure Laterality Date  .  NASAL SURG    . APPENDECTOMY    . BACK SURGERY    . CARDIAC CATHETERIZATION  10/12  . CARDIAC STENTS  01/2011   Unknown details, but would be prior to CABG; BMS prior to recurrent Angina leading to CABG.  . CORONARY ARTERY BYPASS GRAFT  X 4 VESSELS 2012   [facility]: LIMA-LAD, SVG-DIAG, SVG-OM, SVGdRCA  . EYE SURGERY     bilateral lasik  . LUMBAR LAMINECTOMY/DECOMPRESSION MICRODISCECTOMY  06/25/2011   Procedure: LUMBAR LAMINECTOMY/DECOMPRESSION MICRODISCECTOMY;  Surgeon: Jacki Cones;  Location: [facility];  Service: Orthopedics;  Laterality: Right;  Lumbar Hemi Laminectomy L5 - S1 on the Right/Microdiscectomy on the Right  (X-Ray  . LUMBAR LAMINECTOMY/DECOMPRESSION MICRODISCECTOMY  12/23/2011   Procedure: LUMBAR LAMINECTOMY/DECOMPRESSION MICRODISCECTOMY;  Surgeon: Jacki Cones, MD;  Location: [facility];  Service: Orthopedics;  Laterality: Right;  Hemi Laminectomy, Microdiscectomy L5-S1 on right  . OOPHORECTOMY    . right above knee amputation    . RIGHT/LEFT HEART CATH AND CORONARY/GRAFT ANGIOGRAPHY N/A 09/16/2018  Procedure: RIGHT/LEFT HEART CATH AND CORONARY/GRAFT ANGIOGRAPHY;  Surgeon: Sw[place]il[place], Peter M, MD;  Location: [facility] INVASIVE CV LAB;  Service: Cardiovascular;  Laterality: N/A;  . ROTATOR CUFF REPAIR  right  . TRANSTHORACIC ECHOCARDIOGRAM  12/2016   Mild LV dilation.  Mild LVH.  Mildly decreased EF of 45 to 50% with postsurgical hypokinesis of the interventricular septum consistent with CABG.  Severe abnormal  diastolic filling (grade 2/pseudonormalization).  Moderate-severe LA dilation.  Mild MR.  Mild aortic sclerosis but no stenosis.     Current Meds  Medication Sig  . aspirin EC 81 MG tablet Take 81 mg by mouth daily after breakfast. States LD 12/16/11  . atorvastatin (LIPITOR) 80 MG tablet Take 1 tablet (80 mg total) by mouth daily at 6 PM.  . brimonidine (ALPHAGAN) 0.2 % ophthalmic solution Place 1 drop into the left eye 3 (three) times daily.  . clopidogrel (PLAVIX) 75 MG tablet Take 75 mg by mouth daily.  Marland Kitchen dicyclomine (BENTYL) 20 MG tablet Take 20 mg by mouth every 6 (six) hours.  . DULoxetine (CYMBALTA) 60 MG capsule Take 60 mg by mouth 2 (two) times daily.  . furosemide (LASIX) 20 MG tablet Take 1 tablet (20 mg total) by mouth daily.  Marland Kitchen gabapentin (NEURONTIN) 400 MG capsule Take 400 mg by mouth 3 (three) times daily.  . insulin aspart (NOVOLOG) 100 UNIT/ML injection Inject 1-12 Units into the skin See admin instructions. Sliding scale per chart for each meal  . insulin glargine (LANTUS) 100 UNIT/ML injection Inject 40 Units into the skin at bedtime.   Marland Kitchen ketorolac (ACULAR) 0.5 % ophthalmic solution PLACE ONE DROP IN THE LEFT EYE FOUR TIMES DAILY  . losartan (COZAAR) 50 MG tablet Take 1 tablet (50 mg total) by mouth daily.  . metoprolol succinate (TOPROL-XL) 50 MG 24 hr tablet Take 1 tablet (50 mg total) by mouth daily. Take with or immediately following a meal.  . nitroGLYCERIN (NITROSTAT) 0.4 MG SL tablet Place 0.4 mg under the tongue every 5 (five) minutes as needed. Chest pain  . Oxycodone HCl 10 MG TABS Take 10 mg by mouth 5 (five) times daily.  Marland Kitchen spironolactone (ALDACTONE) 25 MG tablet Take 0.5 tablets (12.5 mg total) by mouth daily.  . timolol (TIMOPTIC) 0.5 % ophthalmic solution Apply 1 drop to eye 2 (two) times daily.  Marland Kitchen tiZANidine (ZANAFLEX) 4 MG tablet TAKE 1/2 TO 1 TABLET BY MOUTH AT BEDTIME AS NEEDED FOR MUSCLE SPASMS  . [DISCONTINUED] insulin aspart (NOVOLOG) 100 UNIT/ML  injection Inject 10 Units into the skin 3 (three) times daily with meals.     Allergies:   Codeine; Jardiance [empagliflozin]; Sulfa antibiotics; and Tramadol   Social History   Tobacco Use  . Smoking status: Current Every Day Smoker    Packs/day: 1.00    Types: Cigarettes    Last attempt to quit: 03/23/2009    Years since quitting: 9.5  . Smokeless tobacco: Never Used  Substance Use Topics  . Alcohol use: No  . Drug use: No     Family Hx: The patient's family history includes Hyperlipidemia in her father; Hypertension in her father.  ROS:   Please see the history of present illness.    NO chest pain or orthopnea All other systems reviewed and are negative.   Prior CV studies:   The following studies were reviewed today: [place], Peter M, MD (Primary)    Procedures   RIGHT/LEFT HEART CATH AND CORONARY/GRAFT ANGIOGRAPHY  Conclusion     Prox  LAD lesion is 100% stenosed.  Ost 1st Mrg lesion is 100% stenosed.  Prox RCA to Mid RCA lesion is 100% stenosed.  LIMA graft was visualized by angiography and is normal in caliber.  The graft exhibits no disease.  Seq SVG- second diagonal and OM1 graft was visualized by angiography and is normal in caliber.  The graft exhibits no disease.  SVG graft was visualized by angiography and is normal in caliber.  The graft exhibits no disease.  There is mild to moderate left ventricular systolic dysfunction.  The left ventricular ejection fraction is 35-45% by visual estimate.  LV end diastolic pressure is mildly elevated.   1. 3 vessel occlusive CAD.     -100% LAD after a tiny first diagonal    - 100% OM 1    - 100% proximal to mid RCA at site of prior stent 2. Patent LIMA to the LAD 3. Patent sequential SVG to the second diagonal and OM 1 4. Patent SVG to the distal RCA 5. Moderate LV dysfunction. EF estimated at 40-45% 6. Mildly elevated LV filling pressures 7. Normal right heart pressures.  8. Good cardiac output.   Plan: continue medical therapy. Plan to transition to oral lasix tomorrow.        Labs/Other Tests and Data Reviewed:    EKG:  No ECG reviewed.  Recent Labs: 09/17/2018: ALT 17 09/19/2018: BUN 39; Creatinine, Ser 1.06; Hemoglobin 12.8; Magnesium 2.2; Platelets 419; Potassium 4.9; Sodium 139   Recent Lipid Panel Lab Results  Component Value Date/Time   CHOL 124 09/17/2018 02:48 AM   TRIG 62 09/17/2018 02:48 AM   HDL 32 (L) 09/17/2018 02:48 AM   CHOLHDL 3.9 09/17/2018 02:48 AM   LDLCALC 80 09/17/2018 02:48 AM    Wt Readings from Last 3 Encounters:  09/29/18 220 lb (99.8 kg)  09/19/18 202 lb 13.2 oz (92 kg)  10/30/16 215 lb (97.5 kg)     Objective:    Vital Signs:  BP (!) 159/69 (BP Location: Left Arm, Patient Position: Sitting, Cuff Size: Normal)   Ht 5' 2.5" (1.588 m)   Wt 220 lb (99.8 kg)   BMI 39.60 kg/m    Well nourished, well developed female in no acute distress. Pt was comfortable over the phone  ASSESSMENT & PLAN:    1. CAD: Secondary prevention stressed to the patient.  Importance of compliance with diet and medication stressed and she vocalized understanding.  Her blood pressure is stable. 2. Essential hypertension: Her blood pressure is stable 3. Diabetes Mellitus: Diet was discussed and this is followed by her primary care physician 4. Mixed dyslipidemia: Diet was discussed with followed by her primary care physician 5. Cigarette smoking: I spent 5 minutes with the patient discussing solely about smoking. Smoking cessation was counseled. I suggested to the patient also different medications and pharmacological interventions. Patient is keen to try stopping on its own at this time. He will get back to me if he needs any further assistance in this matter. 6. 3 months COVID-19 Education: The signs and symptoms of COVID-19 were discussed with the patient and how to seek care for testing (follow up with PCP or arrange E-visit).  The importance of social  distancing was discussed today.  Time:   Today, I have spent 16 minutes with the patient with telehealth technology discussing the above problems.     Medication Adjustments/Labs and Tests Ordered: Current medicines are reviewed at length with the patient today.  Concerns regarding medicines are  outlined above.   Tests Ordered: No orders of the defined types were placed in this encounter.   Medication Changes: No orders of the defined types were placed in this encounter.   Disposition:  Follow up   Signed, Garwin Brothers, MD  09/29/2018 1:42 PM    [facility]

## 2018-10-25 DIAGNOSIS — M5136 Other intervertebral disc degeneration, lumbar region: Secondary | ICD-10-CM | POA: Diagnosis not present

## 2018-10-25 DIAGNOSIS — I700 Atherosclerosis of aorta: Secondary | ICD-10-CM | POA: Diagnosis not present

## 2018-10-25 DIAGNOSIS — M549 Dorsalgia, unspecified: Secondary | ICD-10-CM | POA: Diagnosis not present

## 2018-10-27 DIAGNOSIS — M47816 Spondylosis without myelopathy or radiculopathy, lumbar region: Secondary | ICD-10-CM | POA: Diagnosis not present

## 2018-10-27 DIAGNOSIS — M961 Postlaminectomy syndrome, not elsewhere classified: Secondary | ICD-10-CM | POA: Diagnosis not present

## 2018-10-27 DIAGNOSIS — Z79891 Long term (current) use of opiate analgesic: Secondary | ICD-10-CM | POA: Diagnosis not present

## 2018-10-27 DIAGNOSIS — J449 Chronic obstructive pulmonary disease, unspecified: Secondary | ICD-10-CM | POA: Diagnosis not present

## 2018-10-27 DIAGNOSIS — I2510 Atherosclerotic heart disease of native coronary artery without angina pectoris: Secondary | ICD-10-CM | POA: Diagnosis not present

## 2018-10-27 DIAGNOSIS — M5416 Radiculopathy, lumbar region: Secondary | ICD-10-CM | POA: Diagnosis not present

## 2018-10-27 DIAGNOSIS — G894 Chronic pain syndrome: Secondary | ICD-10-CM | POA: Diagnosis not present

## 2018-10-27 DIAGNOSIS — E119 Type 2 diabetes mellitus without complications: Secondary | ICD-10-CM | POA: Diagnosis not present

## 2018-11-02 DIAGNOSIS — H4313 Vitreous hemorrhage, bilateral: Secondary | ICD-10-CM | POA: Diagnosis not present

## 2018-11-02 DIAGNOSIS — E113511 Type 2 diabetes mellitus with proliferative diabetic retinopathy with macular edema, right eye: Secondary | ICD-10-CM | POA: Diagnosis not present

## 2018-11-02 DIAGNOSIS — H3582 Retinal ischemia: Secondary | ICD-10-CM | POA: Diagnosis not present

## 2018-11-02 DIAGNOSIS — E113592 Type 2 diabetes mellitus with proliferative diabetic retinopathy without macular edema, left eye: Secondary | ICD-10-CM | POA: Diagnosis not present

## 2018-11-09 DIAGNOSIS — H40052 Ocular hypertension, left eye: Secondary | ICD-10-CM | POA: Insufficient documentation

## 2018-11-11 DIAGNOSIS — Z01812 Encounter for preprocedural laboratory examination: Secondary | ICD-10-CM | POA: Diagnosis not present

## 2018-11-11 DIAGNOSIS — Z961 Presence of intraocular lens: Secondary | ICD-10-CM | POA: Insufficient documentation

## 2018-11-11 DIAGNOSIS — Z9889 Other specified postprocedural states: Secondary | ICD-10-CM | POA: Insufficient documentation

## 2018-11-11 DIAGNOSIS — Z1159 Encounter for screening for other viral diseases: Secondary | ICD-10-CM | POA: Diagnosis not present

## 2018-11-14 DIAGNOSIS — H25812 Combined forms of age-related cataract, left eye: Secondary | ICD-10-CM | POA: Diagnosis not present

## 2018-11-14 DIAGNOSIS — R69 Illness, unspecified: Secondary | ICD-10-CM | POA: Diagnosis not present

## 2018-11-14 DIAGNOSIS — I252 Old myocardial infarction: Secondary | ICD-10-CM | POA: Diagnosis not present

## 2018-11-14 DIAGNOSIS — M1990 Unspecified osteoarthritis, unspecified site: Secondary | ICD-10-CM | POA: Diagnosis not present

## 2018-11-14 DIAGNOSIS — H409 Unspecified glaucoma: Secondary | ICD-10-CM | POA: Diagnosis not present

## 2018-11-14 DIAGNOSIS — H40052 Ocular hypertension, left eye: Secondary | ICD-10-CM | POA: Diagnosis not present

## 2018-11-14 DIAGNOSIS — I10 Essential (primary) hypertension: Secondary | ICD-10-CM | POA: Diagnosis not present

## 2018-11-14 DIAGNOSIS — H25043 Posterior subcapsular polar age-related cataract, bilateral: Secondary | ICD-10-CM | POA: Diagnosis not present

## 2018-11-14 DIAGNOSIS — E1136 Type 2 diabetes mellitus with diabetic cataract: Secondary | ICD-10-CM | POA: Diagnosis not present

## 2018-11-14 DIAGNOSIS — Z8673 Personal history of transient ischemic attack (TIA), and cerebral infarction without residual deficits: Secondary | ICD-10-CM | POA: Diagnosis not present

## 2018-11-21 DIAGNOSIS — R0989 Other specified symptoms and signs involving the circulatory and respiratory systems: Secondary | ICD-10-CM | POA: Diagnosis not present

## 2018-11-21 DIAGNOSIS — Z89611 Acquired absence of right leg above knee: Secondary | ICD-10-CM | POA: Diagnosis not present

## 2018-11-21 DIAGNOSIS — J441 Chronic obstructive pulmonary disease with (acute) exacerbation: Secondary | ICD-10-CM | POA: Diagnosis not present

## 2018-11-21 DIAGNOSIS — E1169 Type 2 diabetes mellitus with other specified complication: Secondary | ICD-10-CM | POA: Diagnosis not present

## 2018-11-21 DIAGNOSIS — I472 Ventricular tachycardia: Secondary | ICD-10-CM | POA: Diagnosis not present

## 2018-11-21 DIAGNOSIS — I739 Peripheral vascular disease, unspecified: Secondary | ICD-10-CM | POA: Diagnosis not present

## 2018-11-21 DIAGNOSIS — E785 Hyperlipidemia, unspecified: Secondary | ICD-10-CM | POA: Diagnosis not present

## 2018-11-21 DIAGNOSIS — I5021 Acute systolic (congestive) heart failure: Secondary | ICD-10-CM | POA: Diagnosis not present

## 2018-11-21 DIAGNOSIS — E1143 Type 2 diabetes mellitus with diabetic autonomic (poly)neuropathy: Secondary | ICD-10-CM | POA: Diagnosis not present

## 2018-11-21 DIAGNOSIS — I208 Other forms of angina pectoris: Secondary | ICD-10-CM | POA: Diagnosis not present

## 2018-11-24 DIAGNOSIS — G894 Chronic pain syndrome: Secondary | ICD-10-CM | POA: Diagnosis not present

## 2018-11-24 DIAGNOSIS — E119 Type 2 diabetes mellitus without complications: Secondary | ICD-10-CM | POA: Diagnosis not present

## 2018-11-24 DIAGNOSIS — M5416 Radiculopathy, lumbar region: Secondary | ICD-10-CM | POA: Diagnosis not present

## 2018-11-24 DIAGNOSIS — J449 Chronic obstructive pulmonary disease, unspecified: Secondary | ICD-10-CM | POA: Diagnosis not present

## 2018-11-24 DIAGNOSIS — M47816 Spondylosis without myelopathy or radiculopathy, lumbar region: Secondary | ICD-10-CM | POA: Diagnosis not present

## 2018-11-24 DIAGNOSIS — Z79891 Long term (current) use of opiate analgesic: Secondary | ICD-10-CM | POA: Diagnosis not present

## 2018-11-24 DIAGNOSIS — Z79899 Other long term (current) drug therapy: Secondary | ICD-10-CM | POA: Diagnosis not present

## 2018-11-24 DIAGNOSIS — M961 Postlaminectomy syndrome, not elsewhere classified: Secondary | ICD-10-CM | POA: Diagnosis not present

## 2018-11-24 DIAGNOSIS — I2510 Atherosclerotic heart disease of native coronary artery without angina pectoris: Secondary | ICD-10-CM | POA: Diagnosis not present

## 2018-11-25 DIAGNOSIS — R0989 Other specified symptoms and signs involving the circulatory and respiratory systems: Secondary | ICD-10-CM | POA: Diagnosis not present

## 2018-11-25 DIAGNOSIS — I6523 Occlusion and stenosis of bilateral carotid arteries: Secondary | ICD-10-CM | POA: Diagnosis not present

## 2018-11-28 DIAGNOSIS — R918 Other nonspecific abnormal finding of lung field: Secondary | ICD-10-CM | POA: Diagnosis not present

## 2018-11-28 DIAGNOSIS — R300 Dysuria: Secondary | ICD-10-CM | POA: Diagnosis not present

## 2018-11-28 DIAGNOSIS — Z23 Encounter for immunization: Secondary | ICD-10-CM | POA: Diagnosis not present

## 2018-11-28 DIAGNOSIS — J189 Pneumonia, unspecified organism: Secondary | ICD-10-CM | POA: Diagnosis not present

## 2018-11-28 DIAGNOSIS — R079 Chest pain, unspecified: Secondary | ICD-10-CM | POA: Diagnosis not present

## 2018-11-28 DIAGNOSIS — I2510 Atherosclerotic heart disease of native coronary artery without angina pectoris: Secondary | ICD-10-CM | POA: Diagnosis not present

## 2018-11-28 DIAGNOSIS — R069 Unspecified abnormalities of breathing: Secondary | ICD-10-CM | POA: Diagnosis not present

## 2018-11-28 DIAGNOSIS — I499 Cardiac arrhythmia, unspecified: Secondary | ICD-10-CM | POA: Diagnosis not present

## 2018-11-28 DIAGNOSIS — G894 Chronic pain syndrome: Secondary | ICD-10-CM | POA: Diagnosis not present

## 2018-11-28 DIAGNOSIS — R0603 Acute respiratory distress: Secondary | ICD-10-CM | POA: Diagnosis not present

## 2018-11-28 DIAGNOSIS — I252 Old myocardial infarction: Secondary | ICD-10-CM | POA: Diagnosis not present

## 2018-11-28 DIAGNOSIS — E1142 Type 2 diabetes mellitus with diabetic polyneuropathy: Secondary | ICD-10-CM | POA: Diagnosis not present

## 2018-11-28 DIAGNOSIS — J441 Chronic obstructive pulmonary disease with (acute) exacerbation: Secondary | ICD-10-CM | POA: Diagnosis not present

## 2018-11-28 DIAGNOSIS — R509 Fever, unspecified: Secondary | ICD-10-CM | POA: Diagnosis not present

## 2018-11-28 DIAGNOSIS — J440 Chronic obstructive pulmonary disease with acute lower respiratory infection: Secondary | ICD-10-CM | POA: Diagnosis not present

## 2018-11-28 DIAGNOSIS — I110 Hypertensive heart disease with heart failure: Secondary | ICD-10-CM | POA: Diagnosis not present

## 2018-11-28 DIAGNOSIS — J129 Viral pneumonia, unspecified: Secondary | ICD-10-CM | POA: Diagnosis not present

## 2018-11-28 DIAGNOSIS — E785 Hyperlipidemia, unspecified: Secondary | ICD-10-CM | POA: Diagnosis not present

## 2018-11-28 DIAGNOSIS — J159 Unspecified bacterial pneumonia: Secondary | ICD-10-CM | POA: Diagnosis not present

## 2018-11-28 DIAGNOSIS — I5023 Acute on chronic systolic (congestive) heart failure: Secondary | ICD-10-CM | POA: Diagnosis not present

## 2018-11-28 DIAGNOSIS — I340 Nonrheumatic mitral (valve) insufficiency: Secondary | ICD-10-CM | POA: Diagnosis not present

## 2018-11-28 DIAGNOSIS — Z03818 Encounter for observation for suspected exposure to other biological agents ruled out: Secondary | ICD-10-CM | POA: Diagnosis not present

## 2018-11-28 DIAGNOSIS — J9621 Acute and chronic respiratory failure with hypoxia: Secondary | ICD-10-CM | POA: Diagnosis not present

## 2018-11-28 DIAGNOSIS — I361 Nonrheumatic tricuspid (valve) insufficiency: Secondary | ICD-10-CM | POA: Diagnosis not present

## 2018-11-28 DIAGNOSIS — R0602 Shortness of breath: Secondary | ICD-10-CM | POA: Diagnosis not present

## 2018-11-29 DIAGNOSIS — I5023 Acute on chronic systolic (congestive) heart failure: Secondary | ICD-10-CM

## 2018-12-07 ENCOUNTER — Telehealth (INDEPENDENT_AMBULATORY_CARE_PROVIDER_SITE_OTHER): Payer: Medicare HMO | Admitting: Cardiology

## 2018-12-07 ENCOUNTER — Other Ambulatory Visit: Payer: Self-pay

## 2018-12-07 ENCOUNTER — Encounter: Payer: Self-pay | Admitting: Cardiology

## 2018-12-07 VITALS — Ht 62.5 in | Wt 200.0 lb

## 2018-12-07 DIAGNOSIS — I10 Essential (primary) hypertension: Secondary | ICD-10-CM

## 2018-12-07 DIAGNOSIS — I739 Peripheral vascular disease, unspecified: Secondary | ICD-10-CM | POA: Diagnosis not present

## 2018-12-07 DIAGNOSIS — R69 Illness, unspecified: Secondary | ICD-10-CM | POA: Diagnosis not present

## 2018-12-07 DIAGNOSIS — F17210 Nicotine dependence, cigarettes, uncomplicated: Secondary | ICD-10-CM | POA: Diagnosis not present

## 2018-12-07 DIAGNOSIS — I509 Heart failure, unspecified: Secondary | ICD-10-CM

## 2018-12-07 DIAGNOSIS — E782 Mixed hyperlipidemia: Secondary | ICD-10-CM | POA: Diagnosis not present

## 2018-12-07 DIAGNOSIS — Z951 Presence of aortocoronary bypass graft: Secondary | ICD-10-CM

## 2018-12-07 DIAGNOSIS — E1159 Type 2 diabetes mellitus with other circulatory complications: Secondary | ICD-10-CM | POA: Diagnosis not present

## 2018-12-07 DIAGNOSIS — J432 Centrilobular emphysema: Secondary | ICD-10-CM

## 2018-12-07 DIAGNOSIS — J431 Panlobular emphysema: Secondary | ICD-10-CM

## 2018-12-07 DIAGNOSIS — E119 Type 2 diabetes mellitus without complications: Secondary | ICD-10-CM

## 2018-12-07 NOTE — Patient Instructions (Signed)
Medication Instructions:  Your physician recommends that you continue on your current medications as directed. Please refer to the Current Medication list given to you today.  If you need a refill on your cardiac medications before your next appointment, please call your pharmacy.   Lab work: NONE  If you have labs (blood work) drawn today and your tests are completely normal, you will receive your results only by: . MyChart Message (if you have MyChart) OR . A paper copy in the mail If you have any lab test that is abnormal or we need to change your treatment, we will call you to review the results.  Testing/Procedures: NONE  Follow-Up: At [facility], you and your health needs are our priority.  As part of our continuing mission to provide you with exceptional heart care, we have created designated Provider Care Teams.  These Care Teams include your primary Cardiologist (physician) and Advanced Practice Providers (APPs -  Physician Assistants and Nurse Practitioners) who all work together to provide you with the care you need, when you need it. You will need a follow up appointment in 2 weeks.   

## 2018-12-07 NOTE — Progress Notes (Signed)
Virtual Visit via Telephone Note   This visit type was conducted due to national recommendations for restrictions regarding the COVID-19 Pandemic (e.g. social distancing) in an effort to limit this patient's exposure and mitigate transmission in our community.  Due to her co-morbid illnesses, this patient is at least at moderate risk for complications without adequate follow up.  This format is felt to be most appropriate for this patient at this time.  The patient did not have access to video technology/had technical difficulties with video requiring transitioning to audio format only (telephone).  All issues noted in this document were discussed and addressed.  No physical exam could be performed with this format.  Please refer to the patient's chart for her  consent to telehealth for [facility].   Date:  12/07/2018   ID:  Mindy Love, DOB 1955-12-25, MRN [id number]  Patient Location: Home Provider Location: Office  PCP:  Elisabeth Cara, MD  Cardiologist:  No primary care provider on file.  Electrophysiologist:  None   Evaluation Performed:  Follow-Up Visit  Chief Complaint: Congestive heart failure and post hospital discharge  History of Present Illness:    Mindy Love is a 63 y.o. female with with past medical history of systolic heart failure, advanced cardiomyopathy, prior CABG, COPD, CVA, diabetes mellitus on insulin, essential hypertension, dyslipidemia and a right AKA in 2017, left-sided toe amputations in 2020, GERD, chronic back pain and heavy smoking.  She was admitted to the hospital with a diagnosis of congestive heart failure and her white count was also elevated at 13.5 on presentation.  Initial screen for COVID-19 was negative.  She was treated.  She mentions to me that she got a course of antibiotic and was released.  Coronary angiography report has been mentioned below in detail.  Since hospital discharge she feels better.  No chest pain orthopnea or PND.  At the  time of my evaluation, the patient is alert awake oriented and in no distress.  The patient does not have symptoms concerning for COVID-19 infection (fever, chills, cough, or new shortness of breath).    Past Medical History:  Diagnosis Date   Anxiety    CAD, multiple vessel 03/2011   Status post CABG x4 - LIMA-LAD, SVG-DIAG, SVG-OM, SVG-dRCA.([facility])   Chronic diastolic CHF (congestive heart failure), NYHA class 2 (HCC)    With mild systolic dysfunction by last echo   Depression    Diabetes mellitus    Frequency    GERD (gastroesophageal reflux disease)    Herniated disc    Hyperlipidemia    Myocardial infarction ([place]) 10/12   OV Dr Geraldo Pitter  12/22/11 with clearance note on chart , EKG  11/12 on chart   Paroxysmal atrial fibrillation ([place])    Documented during COPD exacerbation hospitalization in 2018   Past Surgical History:  Procedure Laterality Date    NASAL SURG     APPENDECTOMY     [place]  10/12   CARDIAC STENTS  01/2011   Unknown details, but would be prior to CABG; BMS prior to recurrent Angina leading to CABG.   CORONARY ARTERY BYPASS GRAFT  X 4 VESSELS 2012   [facility]: LIMA-LAD, SVG-DIAG, SVG-OM, [facility]   EYE SURGERY     bilateral lasik   LUMBAR LAMINECTOMY/DECOMPRESSION MICRODISCECTOMY  06/25/2011   Procedure: LUMBAR LAMINECTOMY/DECOMPRESSION MICRODISCECTOMY;  Surgeon: Tobi Bastos;  Location: [facility];  Service: Orthopedics;  Laterality: Right;  Lumbar Hemi Laminectomy L5 - S1 on the Right/Microdiscectomy on the Right  (X-Ray   LUMBAR LAMINECTOMY/DECOMPRESSION MICRODISCECTOMY  12/23/2011   Procedure: LUMBAR LAMINECTOMY/DECOMPRESSION MICRODISCECTOMY;  Surgeon: Jacki Conesonald A Gioffre, MD;  Location: [facility];  Service: Orthopedics;  Laterality: Right;  Hemi Laminectomy, Microdiscectomy L5-S1 on right   OOPHORECTOMY     right above knee amputation     RIGHT/LEFT HEART CATH AND  CORONARY/GRAFT ANGIOGRAPHY N/A 09/16/2018   Procedure: RIGHT/LEFT HEART CATH AND CORONARY/GRAFT ANGIOGRAPHY;  Surgeon: Swazil[place]Jordan, Peter M, MD;  [facility];  Service: Cardiovascular;  Laterality: N/A;   ROTATOR CUFF REPAIR  right   TRANSTHORACIC ECHOCARDIOGRAM  12/2016   Mild LV dilation.  Mild LVH.  Mildly decreased EF of 45 to 50% with postsurgical hypokinesis of the interventricular septum consistent with CABG.  Severe abnormal diastolic filling (grade 2/pseudonormalization).  Moderate-severe LA dilation.  Mild MR.  Mild aortic sclerosis but no stenosis.     Current Meds  Medication Sig   aspirin EC 81 MG tablet Take 81 mg by mouth daily after breakfast. States LD 12/16/11   atorvastatin (LIPITOR) 80 MG tablet Take 1 tablet (80 mg total) by mouth daily at 6 PM.   brimonidine (ALPHAGAN) 0.2 % ophthalmic solution Place 1 drop into the left eye 3 (three) times daily.   clopidogrel (PLAVIX) 75 MG tablet Take 75 mg by mouth daily.   dicyclomine (BENTYL) 20 MG tablet Take 20 mg by mouth every 6 (six) hours.   DULoxetine (CYMBALTA) 60 MG capsule Take 60 mg by mouth 2 (two) times daily.   furosemide (LASIX) 20 MG tablet Take 1 tablet (20 mg total) by mouth daily.   gabapentin (NEURONTIN) 400 MG capsule Take 400 mg by mouth 3 (three) times daily.   insulin aspart (NOVOLOG) 100 UNIT/ML injection Inject 1-12 Units into the skin See admin instructions. Sliding scale per chart for each meal   insulin degludec (TRESIBA FLEXTOUCH) 100 UNIT/ML SOPN FlexTouch Pen 40 Units at bedtime.   ketorolac (ACULAR) 0.5 % ophthalmic solution PLACE ONE DROP IN THE LEFT EYE FOUR TIMES DAILY   losartan (COZAAR) 50 MG tablet Take 1 tablet (50 mg total) by mouth daily.   metoprolol succinate (TOPROL-XL) 50 MG 24 hr tablet Take 1 tablet (50 mg total) by mouth daily. Take with or immediately following a meal.   nitroGLYCERIN (NITROSTAT) 0.4 MG SL tablet Place 0.4 mg under the tongue every 5 (five)  minutes as needed. Chest pain   Oxycodone HCl 10 MG TABS Take 10 mg by mouth 5 (five) times daily.   spironolactone (ALDACTONE) 25 MG tablet Take 0.5 tablets (12.5 mg total) by mouth daily.   timolol (TIMOPTIC) 0.5 % ophthalmic solution Apply 1 drop to eye 2 (two) times daily.   tiZANidine (ZANAFLEX) 4 MG tablet TAKE 1/2 TO 1 TABLET BY MOUTH AT BEDTIME AS NEEDED FOR MUSCLE SPASMS     Allergies:   Other, Sulfasalazine, Codeine, Jardiance [empagliflozin], Sulfa antibiotics, and Tramadol   Social History   Tobacco Use   Smoking status: Current Every Day Smoker    Packs/day: 1.00    Types: Cigarettes    Last attempt to quit: 03/23/2009    Years since quitting: 9.7   Smokeless tobacco: Never Used  Substance Use Topics   Alcohol use: No   Drug use: No     Family Hx: The patient's family history includes Hyperlipidemia in her father; Hypertension in her father.  ROS:   Please see the history of  present illness.    As mentioned above All other systems reviewed and are negative.   Prior CV studies:   The following studies were reviewed today:  RIGHT/LEFT HEART CATH AND CORONARY/GRAFT ANGIOGRAPHY  Conclusion    Prox LAD lesion is 100% stenosed.  Ost 1st Mrg lesion is 100% stenosed.  Prox RCA to Mid RCA lesion is 100% stenosed.  LIMA graft was visualized by angiography and is normal in caliber.  The graft exhibits no disease.  Seq SVG- second diagonal and OM1 graft was visualized by angiography and is normal in caliber.  The graft exhibits no disease.  SVG graft was visualized by angiography and is normal in caliber.  The graft exhibits no disease.  There is mild to moderate left ventricular systolic dysfunction.  The left ventricular ejection fraction is 35-45% by visual estimate.  LV end diastolic pressure is mildly elevated.   1. 3 vessel occlusive CAD.     -100% LAD after a tiny first diagonal    - 100% OM 1    - 100% proximal to mid RCA at site of  prior stent 2. Patent LIMA to the LAD 3. Patent sequential SVG to the second diagonal and OM 1 4. Patent SVG to the distal RCA 5. Moderate LV dysfunction. EF estimated at 40-45% 6. Mildly elevated LV filling pressures 7. Normal right heart pressures.  8. Good cardiac output.  Plan: continue medical therapy. Plan to transition to oral lasix tomorrow.    Echocardiogram done at [facility] on 11/29/2018 revealed ejection fraction of 15 to 20% mild mitral and tricuspid regurgitation.  Labs/Other Tests and Data Reviewed:    EKG:  EKG was done and revealed sinus rhythm and nonspecific ST-T changes, possible anterior infarction, left axis deviation, T wave inversions in lateral leads and nonspecific ST-T changes  Recent Labs: 09/17/2018: ALT 17 09/19/2018: BUN 39; Creatinine, Ser 1.06; Hemoglobin 12.8; Magnesium 2.2; Platelets 419; Potassium 4.9; Sodium 139   Recent Lipid Panel Lab Results  Component Value Date/Time   CHOL 124 09/17/2018 02:48 AM   TRIG 62 09/17/2018 02:48 AM   HDL 32 (L) 09/17/2018 02:48 AM   CHOLHDL 3.9 09/17/2018 02:48 AM   LDLCALC 80 09/17/2018 02:48 AM    Wt Readings from Last 3 Encounters:  12/07/18 200 lb (90.7 kg)  09/29/18 220 lb (99.8 kg)  09/19/18 202 lb 13.2 oz (92 kg)     Objective:    Vital Signs:  Ht 5' 2.5" (1.588 m)    Wt 200 lb (90.7 kg)    BMI 36.00 kg/m    VITAL SIGNS:  reviewed  ASSESSMENT & PLAN:    1. Coronary artery disease and advanced ischemic cardiomyopathy: Patient has multiple comorbidities.  Patient is on appropriate therapy with diuretics, dual antiplatelet agents, ARB and beta-blocker.  She is also on Spironolactone.  Unfortunately she does not take the best care of herself and continues to smoke.  I told her about compliance with diet and medications and she promises to do so.  She is having an upcoming visit with her primary care physician Dr. Alvester Morin tomorrow and she will get evaluation from him including blood work such as  Chem-7. 2. Essential hypertension: Blood pressure was stable during hospital stay salt intake issues were discussed with the patient 3. Diabetes mellitus: Diet was discussed.  Managed by primary care 4. Mixed dyslipidemia: Diet was discussed.  We will recheck lipids in the future. 5. Cigarette smoking: I spent 5 minutes with the patient discussing  solely about smoking. Smoking cessation was counseled. I suggested to the patient also different medications and pharmacological interventions. Patient is keen to try stopping on its own at this time. He will get back to me if he needs any further assistance in this matter. 6. Follow-up appointment virtually in 2 weeks or earlier if she has any concerns.  COVID-19 Education: The signs and symptoms of COVID-19 were discussed with the patient and how to seek care for testing (follow up with PCP or arrange E-visit).  The importance of social distancing was discussed today.  Time:   Today, I have spent 22 minutes with the patient with telehealth technology discussing the above problems.     Medication Adjustments/Labs and Tests Ordered: Current medicines are reviewed at length with the patient today.  Concerns regarding medicines are outlined above.   Tests Ordered: No orders of the defined types were placed in this encounter.   Medication Changes: No orders of the defined types were placed in this encounter.   Follow Up:  Virtual Visit in 2 week(s)  Signed, Garwin Brothersajan R Ferdie Bakken, MD  12/07/2018 8:41 AM    [facility]

## 2018-12-16 DIAGNOSIS — E113592 Type 2 diabetes mellitus with proliferative diabetic retinopathy without macular edema, left eye: Secondary | ICD-10-CM | POA: Diagnosis not present

## 2018-12-16 DIAGNOSIS — E113511 Type 2 diabetes mellitus with proliferative diabetic retinopathy with macular edema, right eye: Secondary | ICD-10-CM | POA: Diagnosis not present

## 2018-12-16 DIAGNOSIS — H4313 Vitreous hemorrhage, bilateral: Secondary | ICD-10-CM | POA: Diagnosis not present

## 2018-12-16 DIAGNOSIS — H3582 Retinal ischemia: Secondary | ICD-10-CM | POA: Diagnosis not present

## 2018-12-19 DIAGNOSIS — Z794 Long term (current) use of insulin: Secondary | ICD-10-CM | POA: Diagnosis not present

## 2018-12-19 DIAGNOSIS — I110 Hypertensive heart disease with heart failure: Secondary | ICD-10-CM | POA: Diagnosis not present

## 2018-12-19 DIAGNOSIS — E1165 Type 2 diabetes mellitus with hyperglycemia: Secondary | ICD-10-CM | POA: Diagnosis not present

## 2018-12-19 DIAGNOSIS — I2510 Atherosclerotic heart disease of native coronary artery without angina pectoris: Secondary | ICD-10-CM | POA: Diagnosis not present

## 2018-12-19 DIAGNOSIS — Z951 Presence of aortocoronary bypass graft: Secondary | ICD-10-CM | POA: Diagnosis not present

## 2018-12-19 DIAGNOSIS — R079 Chest pain, unspecified: Secondary | ICD-10-CM | POA: Diagnosis not present

## 2018-12-19 DIAGNOSIS — I509 Heart failure, unspecified: Secondary | ICD-10-CM | POA: Diagnosis not present

## 2018-12-19 DIAGNOSIS — Z8673 Personal history of transient ischemic attack (TIA), and cerebral infarction without residual deficits: Secondary | ICD-10-CM | POA: Diagnosis not present

## 2018-12-19 DIAGNOSIS — R072 Precordial pain: Secondary | ICD-10-CM | POA: Diagnosis not present

## 2018-12-19 DIAGNOSIS — I252 Old myocardial infarction: Secondary | ICD-10-CM | POA: Diagnosis not present

## 2018-12-19 DIAGNOSIS — Z03818 Encounter for observation for suspected exposure to other biological agents ruled out: Secondary | ICD-10-CM | POA: Diagnosis not present

## 2018-12-19 DIAGNOSIS — Z5329 Procedure and treatment not carried out because of patient's decision for other reasons: Secondary | ICD-10-CM | POA: Diagnosis not present

## 2018-12-19 DIAGNOSIS — R0602 Shortness of breath: Secondary | ICD-10-CM | POA: Diagnosis not present

## 2018-12-21 ENCOUNTER — Telehealth (INDEPENDENT_AMBULATORY_CARE_PROVIDER_SITE_OTHER): Payer: Medicare HMO | Admitting: Cardiology

## 2018-12-21 ENCOUNTER — Encounter: Payer: Self-pay | Admitting: Cardiology

## 2018-12-21 ENCOUNTER — Other Ambulatory Visit: Payer: Self-pay

## 2018-12-21 VITALS — Ht 62.5 in | Wt 195.0 lb

## 2018-12-21 DIAGNOSIS — I10 Essential (primary) hypertension: Secondary | ICD-10-CM

## 2018-12-21 DIAGNOSIS — I739 Peripheral vascular disease, unspecified: Secondary | ICD-10-CM

## 2018-12-21 DIAGNOSIS — Z720 Tobacco use: Secondary | ICD-10-CM

## 2018-12-21 DIAGNOSIS — E1159 Type 2 diabetes mellitus with other circulatory complications: Secondary | ICD-10-CM | POA: Diagnosis not present

## 2018-12-21 DIAGNOSIS — E119 Type 2 diabetes mellitus without complications: Secondary | ICD-10-CM

## 2018-12-21 DIAGNOSIS — Z951 Presence of aortocoronary bypass graft: Secondary | ICD-10-CM | POA: Diagnosis not present

## 2018-12-21 DIAGNOSIS — I420 Dilated cardiomyopathy: Secondary | ICD-10-CM | POA: Diagnosis not present

## 2018-12-21 NOTE — Patient Instructions (Signed)
Medication Instructions:  Your physician recommends that you continue on your current medications as directed. Please refer to the Current Medication list given to you today.  If you need a refill on your cardiac medications before your next appointment, please call your pharmacy.   Lab work: none If you have labs (blood work) drawn today and your tests are completely normal, you will receive your results only by: Marland Kitchen MyChart Message (if you have MyChart) OR . A paper copy in the mail If you have any lab test that is abnormal or we need to change your treatment, we will call you to review the results.  Testing/Procedures: NONE  Follow-Up: At [facility], you and your health needs are our priority.  As part of our continuing mission to provide you with exceptional heart care, we have created designated Provider Care Teams.  These Care Teams include your primary Cardiologist (physician) and Advanced Practice Providers (APPs -  Physician Assistants and Nurse Practitioners) who all work together to provide you with the care you need, when you need it. You will need a follow up appointment in 1 months.

## 2018-12-21 NOTE — Progress Notes (Signed)
Virtual Visit via Telephone Note   This visit type was conducted due to national recommendations for restrictions regarding the COVID-19 Pandemic (e.g. social distancing) in an effort to limit this patient's exposure and mitigate transmission in our community.  Due to her co-morbid illnesses, this patient is at least at moderate risk for complications without adequate follow up.  This format is felt to be most appropriate for this patient at this time.  The patient did not have access to video technology/had technical difficulties with video requiring transitioning to audio format only (telephone).  All issues noted in this document were discussed and addressed.  No physical exam could be performed with this format.  Please refer to the patient's chart for her  consent to telehealth for [facility].   Date:  12/21/2018   ID:  Mindy Love, DOB 05/01/1956, MRN [id number]  Patient Location: Home Provider Location: Office  PCP:  Elisabeth Cara, MD  Cardiologist:  No primary care provider on file.  Electrophysiologist:  None   Evaluation Performed:  Follow-Up Visit  Chief Complaint: Coronary artery disease  History of Present Illness:    Mindy Love is a 64 y.o. female with multiple medical problems and comorbidities.  Patient has known coronary artery disease, essential hypertension, dyslipidemia and diabetes mellitus.  The patient has had multiple amputations in the lower extremities especially toes and she does not take the best care of herself.  Her ejection fraction is depressed.  She takes her medications regularly.  I evaluated her for a post hospital visit last time.  She was to go to her primary care physician for blood work but mentions to me that her car broke down and that she will be going back in the next few days.  No chest pain orthopnea or PND.  At the time of my evaluation, the patient is alert awake oriented and in no distress.  The patient does not have symptoms  concerning for COVID-19 infection (fever, chills, cough, or new shortness of breath).    Past Medical History:  Diagnosis Date  . Anxiety   . CAD, multiple vessel 03/2011   Status post CABG x4 - LIMA-LAD, SVG-DIAG, SVG-OM, SVG-dRCA.([facility])  . Chronic diastolic CHF (congestive heart failure), NYHA class 2 (HCC)    With mild systolic dysfunction by last echo  . Depression   . Diabetes mellitus   . Frequency   . GERD (gastroesophageal reflux disease)   . Herniated disc   . Hyperlipidemia   . Myocardial infarction ([place]) 10/12   OV Dr Geraldo Pitter  12/22/11 with clearance note on chart , EKG  11/12 on chart  . Paroxysmal atrial fibrillation (HCC)    Documented during COPD exacerbation hospitalization in 2018   Past Surgical History:  Procedure Laterality Date  .  NASAL SURG    . APPENDECTOMY    . BACK SURGERY    . CARDIAC CATHETERIZATION  10/12  . CARDIAC STENTS  01/2011   Unknown details, but would be prior to CABG; BMS prior to recurrent Angina leading to CABG.  . CORONARY ARTERY BYPASS GRAFT  X 4 VESSELS 2012   [facility]: LIMA-LAD, SVG-DIAG, SVG-OM, SVGdRCA  . EYE SURGERY     bilateral lasik  . LUMBAR LAMINECTOMY/DECOMPRESSION MICRODISCECTOMY  06/25/2011   Procedure: LUMBAR LAMINECTOMY/DECOMPRESSION MICRODISCECTOMY;  Surgeon: Tobi Bastos;  [facility];  Service: Orthopedics;  Laterality: Right;  Lumbar Hemi Laminectomy L5 - S1 on the Right/Microdiscectomy on the Right  (  X-Ray  . LUMBAR LAMINECTOMY/DECOMPRESSION MICRODISCECTOMY  12/23/2011   Procedure: LUMBAR LAMINECTOMY/DECOMPRESSION MICRODISCECTOMY;  Surgeon: Jacki Conesonald A Gioffre, MD;  Location: [facility];  Service: Orthopedics;  Laterality: Right;  Hemi Laminectomy, Microdiscectomy L5-S1 on right  . OOPHORECTOMY    . right above knee amputation    . RIGHT/LEFT HEART CATH AND CORONARY/GRAFT ANGIOGRAPHY N/A 09/16/2018   Procedure: RIGHT/LEFT HEART CATH AND CORONARY/GRAFT ANGIOGRAPHY;  Surgeon: Swazil[place]Jordan,  Peter M, MD;  [facility];  Service: Cardiovascular;  Laterality: N/A;  . ROTATOR CUFF REPAIR  right  . TRANSTHORACIC ECHOCARDIOGRAM  12/2016   Mild LV dilation.  Mild LVH.  Mildly decreased EF of 45 to 50% with postsurgical hypokinesis of the interventricular septum consistent with CABG.  Severe abnormal diastolic filling (grade 2/pseudonormalization).  Moderate-severe LA dilation.  Mild MR.  Mild aortic sclerosis but no stenosis.     Current Meds  Medication Sig  . aspirin EC 81 MG tablet Take 81 mg by mouth daily after breakfast. States LD 12/16/11  . atorvastatin (LIPITOR) 80 MG tablet Take 1 tablet (80 mg total) by mouth daily at 6 PM.  . brimonidine (ALPHAGAN) 0.2 % ophthalmic solution Place 1 drop into the left eye 3 (three) times daily.  . clopidogrel (PLAVIX) 75 MG tablet Take 75 mg by mouth daily.  Marland Kitchen. dicyclomine (BENTYL) 20 MG tablet Take 20 mg by mouth every 6 (six) hours.  . DULoxetine (CYMBALTA) 60 MG capsule Take 60 mg by mouth 2 (two) times daily.  . furosemide (LASIX) 20 MG tablet Take 1 tablet (20 mg total) by mouth daily.  Marland Kitchen. gabapentin (NEURONTIN) 400 MG capsule Take 400 mg by mouth 3 (three) times daily.  . insulin aspart (NOVOLOG) 100 UNIT/ML injection Inject 1-12 Units into the skin See admin instructions. Sliding scale per chart for each meal  . insulin degludec (TRESIBA FLEXTOUCH) 100 UNIT/ML SOPN FlexTouch Pen 40 Units at bedtime.  Marland Kitchen. ketorolac (ACULAR) 0.5 % ophthalmic solution PLACE ONE DROP IN THE LEFT EYE FOUR TIMES DAILY  . losartan (COZAAR) 50 MG tablet Take 1 tablet (50 mg total) by mouth daily.  . metoprolol succinate (TOPROL-XL) 50 MG 24 hr tablet Take 1 tablet (50 mg total) by mouth daily. Take with or immediately following a meal.  . nitroGLYCERIN (NITROSTAT) 0.4 MG SL tablet Place 0.4 mg under the tongue every 5 (five) minutes as needed. Chest pain  . Oxycodone HCl 10 MG TABS Take 10 mg by mouth 5 (five) times daily.  Marland Kitchen. spironolactone  (ALDACTONE) 25 MG tablet Take 0.5 tablets (12.5 mg total) by mouth daily.  . timolol (TIMOPTIC) 0.5 % ophthalmic solution Apply 1 drop to eye 2 (two) times daily.  Marland Kitchen. tiZANidine (ZANAFLEX) 4 MG tablet TAKE 1/2 TO 1 TABLET BY MOUTH AT BEDTIME AS NEEDED FOR MUSCLE SPASMS     Allergies:   Other, Sulfasalazine, Codeine, Jardiance [empagliflozin], Sulfa antibiotics, and Tramadol   Social History   Tobacco Use  . Smoking status: Current Every Day Smoker    Packs/day: 1.00    Types: Cigarettes    Last attempt to quit: 03/23/2009    Years since quitting: 9.7  . Smokeless tobacco: Never Used  Substance Use Topics  . Alcohol use: No  . Drug use: No     Family Hx: The patient's family history includes Hyperlipidemia in her father; Hypertension in her father.  ROS:   Please see the history of present illness.    As mentioned above All other systems reviewed and  are negative.   Prior CV studies:   The following studies were reviewed today:  Coronary angiography report was revisited  Labs/Other Tests and Data Reviewed:    EKG:  No ECG reviewed.  Recent Labs: 09/17/2018: ALT 17 09/19/2018: BUN 39; Creatinine, Ser 1.06; Hemoglobin 12.8; Magnesium 2.2; Platelets 419; Potassium 4.9; Sodium 139   Recent Lipid Panel Lab Results  Component Value Date/Time   CHOL 124 09/17/2018 02:48 AM   TRIG 62 09/17/2018 02:48 AM   HDL 32 (L) 09/17/2018 02:48 AM   CHOLHDL 3.9 09/17/2018 02:48 AM   LDLCALC 80 09/17/2018 02:48 AM    Wt Readings from Last 3 Encounters:  12/21/18 195 lb (88.5 kg)  12/07/18 200 lb (90.7 kg)  09/29/18 220 lb (99.8 kg)     Objective:    Vital Signs:  Ht 5' 2.5" (1.588 m)   Wt 195 lb (88.5 kg)   BMI 35.10 kg/m    VITAL SIGNS:  reviewed  ASSESSMENT & PLAN:    1. Coronary artery disease: Secondary prevention stressed with the patient.  Importance of compliance with diet and medication stressed and she vocalized understanding.  She will be going to a primary medical  care doctor in the next 1 to 2 weeks.  She promises me to get blood work done and send me a copy. 2. Essential hypertension: Blood pressure stable 3. Diabetes mellitus: Diet was discussed 4. Mixed dyslipidemia: Lipids followed by primary care physician 5. Cigarette smoking: Smoking 6. Follow-up appointment in a month or earlier if she has any concerns.  She knows to go to the nearest emergency room for any concerning symptoms.  COVID-19 Education: The signs and symptoms of COVID-19 were discussed with the patient and how to seek care for testing (follow up with PCP or arrange E-visit).  The importance of social distancing was discussed today.  Time:   Today, I have spent 12 minutes with the patient with telehealth technology discussing the above problems.     Medication Adjustments/Labs and Tests Ordered: Current medicines are reviewed at length with the patient today.  Concerns regarding medicines are outlined above.   Tests Ordered: No orders of the defined types were placed in this encounter.   Medication Changes: No orders of the defined types were placed in this encounter.   Follow Up:  Virtual Visit or In Person in 1 month(s)  Signed, Garwin Brothers, MD  12/21/2018 2:51 PM    [facility]

## 2018-12-22 DIAGNOSIS — H25811 Combined forms of age-related cataract, right eye: Secondary | ICD-10-CM | POA: Diagnosis not present

## 2018-12-22 DIAGNOSIS — Z01812 Encounter for preprocedural laboratory examination: Secondary | ICD-10-CM | POA: Diagnosis not present

## 2018-12-22 DIAGNOSIS — Z1159 Encounter for screening for other viral diseases: Secondary | ICD-10-CM | POA: Diagnosis not present

## 2018-12-26 DIAGNOSIS — E113593 Type 2 diabetes mellitus with proliferative diabetic retinopathy without macular edema, bilateral: Secondary | ICD-10-CM | POA: Diagnosis not present

## 2018-12-26 DIAGNOSIS — I252 Old myocardial infarction: Secondary | ICD-10-CM | POA: Diagnosis not present

## 2018-12-26 DIAGNOSIS — H40052 Ocular hypertension, left eye: Secondary | ICD-10-CM | POA: Diagnosis not present

## 2018-12-26 DIAGNOSIS — H25811 Combined forms of age-related cataract, right eye: Secondary | ICD-10-CM | POA: Diagnosis not present

## 2018-12-26 DIAGNOSIS — I509 Heart failure, unspecified: Secondary | ICD-10-CM | POA: Diagnosis not present

## 2018-12-26 DIAGNOSIS — E1140 Type 2 diabetes mellitus with diabetic neuropathy, unspecified: Secondary | ICD-10-CM | POA: Diagnosis not present

## 2018-12-26 DIAGNOSIS — H2511 Age-related nuclear cataract, right eye: Secondary | ICD-10-CM | POA: Diagnosis not present

## 2018-12-26 DIAGNOSIS — I110 Hypertensive heart disease with heart failure: Secondary | ICD-10-CM | POA: Diagnosis not present

## 2018-12-26 DIAGNOSIS — E669 Obesity, unspecified: Secondary | ICD-10-CM | POA: Diagnosis not present

## 2018-12-26 DIAGNOSIS — Z794 Long term (current) use of insulin: Secondary | ICD-10-CM | POA: Diagnosis not present

## 2018-12-26 DIAGNOSIS — E1136 Type 2 diabetes mellitus with diabetic cataract: Secondary | ICD-10-CM | POA: Diagnosis not present

## 2018-12-28 ENCOUNTER — Telehealth: Payer: Self-pay | Admitting: Cardiology

## 2018-12-29 DIAGNOSIS — M47816 Spondylosis without myelopathy or radiculopathy, lumbar region: Secondary | ICD-10-CM | POA: Diagnosis not present

## 2018-12-29 DIAGNOSIS — Z79891 Long term (current) use of opiate analgesic: Secondary | ICD-10-CM | POA: Diagnosis not present

## 2018-12-29 DIAGNOSIS — G894 Chronic pain syndrome: Secondary | ICD-10-CM | POA: Diagnosis not present

## 2018-12-29 DIAGNOSIS — M961 Postlaminectomy syndrome, not elsewhere classified: Secondary | ICD-10-CM | POA: Diagnosis not present

## 2018-12-29 DIAGNOSIS — M5416 Radiculopathy, lumbar region: Secondary | ICD-10-CM | POA: Diagnosis not present

## 2018-12-29 DIAGNOSIS — Z1389 Encounter for screening for other disorder: Secondary | ICD-10-CM | POA: Diagnosis not present

## 2019-01-17 DIAGNOSIS — R0902 Hypoxemia: Secondary | ICD-10-CM | POA: Diagnosis not present

## 2019-01-17 DIAGNOSIS — N183 Chronic kidney disease, stage 3 (moderate): Secondary | ICD-10-CM | POA: Diagnosis not present

## 2019-01-17 DIAGNOSIS — Z03818 Encounter for observation for suspected exposure to other biological agents ruled out: Secondary | ICD-10-CM | POA: Diagnosis not present

## 2019-01-17 DIAGNOSIS — M549 Dorsalgia, unspecified: Secondary | ICD-10-CM | POA: Diagnosis not present

## 2019-01-17 DIAGNOSIS — E669 Obesity, unspecified: Secondary | ICD-10-CM | POA: Diagnosis not present

## 2019-01-17 DIAGNOSIS — E1169 Type 2 diabetes mellitus with other specified complication: Secondary | ICD-10-CM | POA: Diagnosis not present

## 2019-01-17 DIAGNOSIS — I509 Heart failure, unspecified: Secondary | ICD-10-CM | POA: Diagnosis not present

## 2019-01-17 DIAGNOSIS — Z89619 Acquired absence of unspecified leg above knee: Secondary | ICD-10-CM | POA: Diagnosis not present

## 2019-01-17 DIAGNOSIS — I10 Essential (primary) hypertension: Secondary | ICD-10-CM | POA: Diagnosis not present

## 2019-01-17 DIAGNOSIS — I119 Hypertensive heart disease without heart failure: Secondary | ICD-10-CM | POA: Diagnosis not present

## 2019-01-17 DIAGNOSIS — I110 Hypertensive heart disease with heart failure: Secondary | ICD-10-CM | POA: Diagnosis not present

## 2019-01-17 DIAGNOSIS — I252 Old myocardial infarction: Secondary | ICD-10-CM | POA: Diagnosis not present

## 2019-01-17 DIAGNOSIS — E119 Type 2 diabetes mellitus without complications: Secondary | ICD-10-CM | POA: Diagnosis not present

## 2019-01-17 DIAGNOSIS — R0602 Shortness of breath: Secondary | ICD-10-CM | POA: Diagnosis not present

## 2019-01-17 DIAGNOSIS — I160 Hypertensive urgency: Secondary | ICD-10-CM | POA: Diagnosis not present

## 2019-01-17 DIAGNOSIS — I739 Peripheral vascular disease, unspecified: Secondary | ICD-10-CM | POA: Diagnosis not present

## 2019-01-17 DIAGNOSIS — R0989 Other specified symptoms and signs involving the circulatory and respiratory systems: Secondary | ICD-10-CM | POA: Diagnosis not present

## 2019-01-17 DIAGNOSIS — I2510 Atherosclerotic heart disease of native coronary artery without angina pectoris: Secondary | ICD-10-CM | POA: Diagnosis not present

## 2019-01-17 DIAGNOSIS — I5023 Acute on chronic systolic (congestive) heart failure: Secondary | ICD-10-CM | POA: Diagnosis not present

## 2019-01-17 DIAGNOSIS — J441 Chronic obstructive pulmonary disease with (acute) exacerbation: Secondary | ICD-10-CM | POA: Diagnosis not present

## 2019-01-17 DIAGNOSIS — I130 Hypertensive heart and chronic kidney disease with heart failure and stage 1 through stage 4 chronic kidney disease, or unspecified chronic kidney disease: Secondary | ICD-10-CM | POA: Diagnosis not present

## 2019-01-17 DIAGNOSIS — Z951 Presence of aortocoronary bypass graft: Secondary | ICD-10-CM | POA: Diagnosis not present

## 2019-01-17 DIAGNOSIS — Z720 Tobacco use: Secondary | ICD-10-CM | POA: Diagnosis not present

## 2019-01-18 DIAGNOSIS — I2510 Atherosclerotic heart disease of native coronary artery without angina pectoris: Secondary | ICD-10-CM

## 2019-01-18 DIAGNOSIS — I5023 Acute on chronic systolic (congestive) heart failure: Secondary | ICD-10-CM

## 2019-01-18 DIAGNOSIS — J441 Chronic obstructive pulmonary disease with (acute) exacerbation: Secondary | ICD-10-CM

## 2019-01-18 DIAGNOSIS — I509 Heart failure, unspecified: Secondary | ICD-10-CM | POA: Diagnosis not present

## 2019-01-18 DIAGNOSIS — I119 Hypertensive heart disease without heart failure: Secondary | ICD-10-CM

## 2019-01-18 DIAGNOSIS — I10 Essential (primary) hypertension: Secondary | ICD-10-CM | POA: Diagnosis not present

## 2019-01-18 DIAGNOSIS — N183 Chronic kidney disease, stage 3 (moderate): Secondary | ICD-10-CM

## 2019-01-18 DIAGNOSIS — M549 Dorsalgia, unspecified: Secondary | ICD-10-CM | POA: Diagnosis not present

## 2019-01-19 DIAGNOSIS — I739 Peripheral vascular disease, unspecified: Secondary | ICD-10-CM

## 2019-01-19 DIAGNOSIS — M549 Dorsalgia, unspecified: Secondary | ICD-10-CM | POA: Diagnosis not present

## 2019-01-19 DIAGNOSIS — N183 Chronic kidney disease, stage 3 (moderate): Secondary | ICD-10-CM

## 2019-01-19 DIAGNOSIS — Z89619 Acquired absence of unspecified leg above knee: Secondary | ICD-10-CM

## 2019-01-19 DIAGNOSIS — Z951 Presence of aortocoronary bypass graft: Secondary | ICD-10-CM

## 2019-01-19 DIAGNOSIS — I2510 Atherosclerotic heart disease of native coronary artery without angina pectoris: Secondary | ICD-10-CM | POA: Diagnosis not present

## 2019-01-19 DIAGNOSIS — I10 Essential (primary) hypertension: Secondary | ICD-10-CM | POA: Diagnosis not present

## 2019-01-19 DIAGNOSIS — J441 Chronic obstructive pulmonary disease with (acute) exacerbation: Secondary | ICD-10-CM

## 2019-01-19 DIAGNOSIS — I509 Heart failure, unspecified: Secondary | ICD-10-CM

## 2019-01-20 DIAGNOSIS — M549 Dorsalgia, unspecified: Secondary | ICD-10-CM | POA: Diagnosis not present

## 2019-01-20 DIAGNOSIS — J441 Chronic obstructive pulmonary disease with (acute) exacerbation: Secondary | ICD-10-CM | POA: Diagnosis not present

## 2019-01-20 DIAGNOSIS — I10 Essential (primary) hypertension: Secondary | ICD-10-CM | POA: Diagnosis not present

## 2019-01-20 DIAGNOSIS — I2510 Atherosclerotic heart disease of native coronary artery without angina pectoris: Secondary | ICD-10-CM | POA: Diagnosis not present

## 2019-01-20 DIAGNOSIS — N183 Chronic kidney disease, stage 3 (moderate): Secondary | ICD-10-CM | POA: Diagnosis not present

## 2019-01-20 DIAGNOSIS — Z89619 Acquired absence of unspecified leg above knee: Secondary | ICD-10-CM | POA: Diagnosis not present

## 2019-01-20 DIAGNOSIS — I509 Heart failure, unspecified: Secondary | ICD-10-CM | POA: Diagnosis not present

## 2019-01-24 ENCOUNTER — Other Ambulatory Visit: Payer: Self-pay | Admitting: *Deleted

## 2019-01-24 NOTE — Patient Outreach (Signed)
[facility]) Care Management  01/24/2019  Mindy Love 18-Mar-1956 [id number]   Initial outreach call   Referral received : 8/10 Referral source : [facility] hospital liaison Referral reason : patient with recent discharge from [facility] ,  Insurance : Monia Pouch Medicare    63 year old female with admission to [facility] 8/4-01/20/19, Dx: Heart failure exacerbation.  Per electronic record , PMHx; includes, Heart failure, EF 15-20% June 2020, Hypertension , COPD, IDDM, Right AKA, Chronic back pain.   Subjective Initial outreach call to patient, HIPAA identifiers verified x 2, explained purpose for the call, [facility] care management patient gives verbal agreement to program.   Patient discussed that she is feeling good on today.   Social  Patient lives at home with her husband that is able to assist patient as needed, they share doing the cooking at home. Patient discussed that do to her amputation she is able pedal around home in her wheel chair, she is able to get onto commode and uses tub transfer bench and showers independently.Discussed with patient if she has received call from home health yet , she states no but is aware of plans at discharge.   Conditions  Heart failure Patient discussed recent admission with heart failure, stating she gradually got worse at home. Patient states she is unable to stand on scales to weight mostly due to difficulty balancing on scales she weighed today trying balance with finger tip touch  and that weight was 204, noted discharge weight 201 on 8/7. Marland KitchenShe discussed not adding salt to foods, review of food with hidden salt such as processed foods, fast foods she states that limit eating out. Patient reports sleeping on the couch so she can prop up more, she does not sleep in the bed stating it is easier to get up from the couch, she does not have a recliner.  Patient monitoring blood pressure daily and reports recent reading range.  from  169/65- 110/75. She denies being shortness of breath , no swelling noted . Reviewed worsening symptoms of heart failure yellow zone , of worsening of shortness of breath , swelling , cough, sudden weight gain of 3 pounds in a day and 5 in a week.  Patient continues to smoke denies ready to quit, states it calms her nerves but states  that it is not good for her.  Diabetes  Patient monitors blood sugar 3 times daily ,recent reading 239 this am, she reports planning to discuss with per PCP about getting one on meter that you can scan to obtain readings.   Medications  Patient was recently discharged from hospital and all medications have been reviewed. Patient able to manage her own medications she has removed discontinued medications from her bag .  Patient denies cost concerns related to medications, she states receiving extra help.   Appointments  Discharge instructions recommend post discharge visit with PCP in one week , patient has not scheduled a visit but is agreeable to calling.   Advanced directive Patient does not have an advanced directive, reviewed the basics of advanced directive and she request education packet.   Plan  Will send PCP barrier/involvement letter, Will send patient welcome letter, advanced directive packet and EMMI .  Will send [facility] living with heart failure book. Will plan weekly outreaches as part of transition of care.  Care Coordination call : Placed call to [facility] to follow up on home health orders on patient discharge instructions, spoke with Alvino Chapel  she reports not having a orders, she able to view cardiology note recommended [facility] , for vest program to monitor lung fluid , she made call to inpatient CM department and they will follow up with  discharging MD and arrange orders to [facility] .    THN CM Care Plan Problem One     Most Recent Value  Care Plan Problem One  At risk for readmission recent hospital discharge related to Heart failure    Role Documenting the Problem One  Care Management [place] for Problem One  Active  [facility] Long Term Goal   Patient will not experience a hospital readmission over the next 60 days   THN Long Term Goal Start Date  01/24/19  Interventions for Problem One Long Term Goal  Reviewed discharge instructions , taking medications as prescribed and notifying MD sooner related to new concerns to avoid readmission   THN CM Short Term Goal #1   Patient will attend all medical appointments over the next 14 days   THN CM Short Term Goal #1 Start Date  01/24/19  Interventions for Short Term Goal #1  Advised patient regarding the importance of prompt follow up with PCP after discharge for review of medical plan ,   THN CM Short Term Goal #2   Over the next 30 days patient will be verbalize at least 2 symptoms in yellow zone of heart failure   THN CM Short Term Goal #2 Start Date  01/24/19  Interventions for Short Term Goal #2  Reviewed yellow zone symptoms of heart failure , worsening shortness of breath, increase in swelling, sudden weight gain of 2 pounds in a day and 5 in a week. .Will send Living with heart book encouraged to read section on heart failure zones.         Joylene Draft, RN, [place] Management Coordinator  [phone]- Mobile [phone]- Toll Free Main Office

## 2019-01-26 DIAGNOSIS — I252 Old myocardial infarction: Secondary | ICD-10-CM | POA: Diagnosis not present

## 2019-01-26 DIAGNOSIS — K219 Gastro-esophageal reflux disease without esophagitis: Secondary | ICD-10-CM | POA: Diagnosis not present

## 2019-01-26 DIAGNOSIS — I5043 Acute on chronic combined systolic (congestive) and diastolic (congestive) heart failure: Secondary | ICD-10-CM | POA: Diagnosis not present

## 2019-01-26 DIAGNOSIS — I5023 Acute on chronic systolic (congestive) heart failure: Secondary | ICD-10-CM | POA: Diagnosis not present

## 2019-01-26 DIAGNOSIS — I2510 Atherosclerotic heart disease of native coronary artery without angina pectoris: Secondary | ICD-10-CM | POA: Diagnosis not present

## 2019-01-26 DIAGNOSIS — I739 Peripheral vascular disease, unspecified: Secondary | ICD-10-CM | POA: Diagnosis not present

## 2019-01-26 DIAGNOSIS — E1122 Type 2 diabetes mellitus with diabetic chronic kidney disease: Secondary | ICD-10-CM | POA: Diagnosis not present

## 2019-01-26 DIAGNOSIS — N183 Chronic kidney disease, stage 3 (moderate): Secondary | ICD-10-CM | POA: Diagnosis not present

## 2019-01-26 DIAGNOSIS — J441 Chronic obstructive pulmonary disease with (acute) exacerbation: Secondary | ICD-10-CM | POA: Diagnosis not present

## 2019-01-26 DIAGNOSIS — E1142 Type 2 diabetes mellitus with diabetic polyneuropathy: Secondary | ICD-10-CM | POA: Diagnosis not present

## 2019-01-26 DIAGNOSIS — N1831 Chronic kidney disease, stage 3a: Secondary | ICD-10-CM | POA: Diagnosis not present

## 2019-01-26 DIAGNOSIS — I130 Hypertensive heart and chronic kidney disease with heart failure and stage 1 through stage 4 chronic kidney disease, or unspecified chronic kidney disease: Secondary | ICD-10-CM | POA: Diagnosis not present

## 2019-01-26 DIAGNOSIS — G8929 Other chronic pain: Secondary | ICD-10-CM | POA: Diagnosis not present

## 2019-01-26 DIAGNOSIS — J449 Chronic obstructive pulmonary disease, unspecified: Secondary | ICD-10-CM | POA: Diagnosis not present

## 2019-01-26 DIAGNOSIS — E669 Obesity, unspecified: Secondary | ICD-10-CM | POA: Diagnosis not present

## 2019-01-31 ENCOUNTER — Encounter: Payer: Self-pay | Admitting: *Deleted

## 2019-01-31 ENCOUNTER — Other Ambulatory Visit: Payer: Self-pay | Admitting: *Deleted

## 2019-01-31 DIAGNOSIS — I5021 Acute systolic (congestive) heart failure: Secondary | ICD-10-CM | POA: Diagnosis not present

## 2019-01-31 DIAGNOSIS — D649 Anemia, unspecified: Secondary | ICD-10-CM | POA: Diagnosis not present

## 2019-01-31 DIAGNOSIS — R600 Localized edema: Secondary | ICD-10-CM | POA: Diagnosis not present

## 2019-01-31 NOTE — Patient Outreach (Signed)
[place] [facility]) Care Management  [place]  01/31/2019   Mindy Love [birth year] [id number]   Transition of care   Referral received : 8/10 Referral source : [facility] hospital liaison Referral reason : patient with recent discharge from [facility] ,  Insurance : Holland Falling Medicare   63 year old female with admission to [facility] 8/4-01/20/19, Dx: Heart failure exacerbation.  PMHx; includes, Heart failure, EF 15-20% June 2020, Hypertension , COPD, IDDM, Right AKA, Chronic back pain.   Subjective:  Outreach call to patient unsuccessful, person answering phone states patient is not at home at this time . Will plan return call later today  1500  Return call to patient , she reports attending post discharge visit with Dr.Bell this morning.  She reports having increase in swelling of left leg, some discomfort at leg and noted " sore " to leg she has been  instructed to keep area clean and dry. Patient reports that she is double her lasix dose to take  40 mg daily   and notify MD of no improvement in swelling. Patient discussed plans for ultrasound test to check for a blood clot     Objective: Ht 1.575 m (5\' 2" )   Wt 205 lb (93 kg)   BMI 37.49 kg/m   Encounter Medications:  Outpatient Encounter Medications as of 01/31/2019  Medication Sig Note  . aspirin EC 81 MG tablet Take 81 mg by mouth daily after breakfast. States LD 12/16/11   . atorvastatin (LIPITOR) 80 MG tablet Take 1 tablet (80 mg total) by mouth daily at 6 PM.   . brimonidine (ALPHAGAN) 0.2 % ophthalmic solution Place 1 drop into the left eye 3 (three) times daily.   . clopidogrel (PLAVIX) 75 MG tablet Take 75 mg by mouth daily.   Marland Kitchen dicyclomine (BENTYL) 20 MG tablet Take 20 mg by mouth every 6 (six) hours.   . DULoxetine (CYMBALTA) 60 MG capsule Take 60 mg by mouth 2 (two) times daily.   . furosemide (LASIX) 20 MG tablet Take 1 tablet (20 mg total) by mouth daily. 01/24/2019: 40 mg daily   .  gabapentin (NEURONTIN) 400 MG capsule Take 400 mg by mouth 3 (three) times daily.   . insulin aspart (NOVOLOG) 100 UNIT/ML injection Inject 1-10 Units into the skin See admin instructions. Sliding scale per chart for each meal   . insulin degludec (TRESIBA FLEXTOUCH) 100 UNIT/ML SOPN FlexTouch Pen 40 Units at bedtime.   Marland Kitchen ketorolac (ACULAR) 0.5 % ophthalmic solution PLACE ONE DROP IN THE LEFT EYE FOUR TIMES DAILY   . losartan (COZAAR) 50 MG tablet Take 1 tablet (50 mg total) by mouth daily. (Patient not taking: Reported on 01/24/2019)   . metoprolol succinate (TOPROL-XL) 50 MG 24 hr tablet Take 1 tablet (50 mg total) by mouth daily. Take with or immediately following a meal. 01/24/2019: Taking 25 mg daily   . nitroGLYCERIN (NITROSTAT) 0.4 MG SL tablet Place 0.4 mg under the tongue every 5 (five) minutes as needed. Chest pain   . Oxycodone HCl 10 MG TABS Take 10 mg by mouth 5 (five) times daily.   . sacubitril-valsartan (ENTRESTO) 24-26 MG Take 1 tablet by mouth 2 (two) times daily.   Marland Kitchen spironolactone (ALDACTONE) 25 MG tablet Take 0.5 tablets (12.5 mg total) by mouth daily. (Patient not taking: Reported on 01/24/2019)   . timolol (TIMOPTIC) 0.5 % ophthalmic solution Apply 1 drop to eye 2 (two) times daily.   Marland Kitchen tiZANidine (ZANAFLEX)  4 MG tablet TAKE 1/2 TO 1 TABLET BY MOUTH AT BEDTIME AS NEEDED FOR MUSCLE SPASMS    No facility-administered encounter medications on file as of 01/31/2019.     Functional Status:  In your present state of health, do you have any difficulty performing the following activities: 01/31/2019 01/31/2019  Hearing? - N  Vision? (No Data) Y  Comment limited vision left eye -  Difficulty concentrating or making decisions? - N  Walking or climbing stairs? - Y  Comment - pivots from chair to chair, right bka  Dressing or bathing? - N  Doing errands, shopping? - Y  Comment - patient husband  Quarry managerreparing Food and eating ? - N  Using the Toilet? - N  In the past six months, have you  accidently leaked urine? - Y  Comment - wears diapers  Do you have problems with loss of bowel control? - N  Managing your Medications? - N  Managing your Finances? - N  Housekeeping or managing your Housekeeping? - Y  Comment - husband helps  Some recent data might be hidden    Fall/Depression Screening: Fall Risk  01/31/2019  Falls in the past year? 0  Risk for fall due to : Impaired balance/gait;Impaired mobility  Follow up Falls prevention discussed   PHQ 2/9 Scores 01/31/2019  PHQ - 2 Score 1    Assessment:   Heart failure  Increased swelling of left lower leg,patient unable to balance on scales to monitor weight. Education on heart failure worsening symptoms and action plan. Reinforced notifying MD for continued swelling at leg, shortness of breath  Emphasized low salt diet to help with decreasing swelling, encouraged patient with elevating leg above level of heart while resting through the day to help with swelling. Will benefit from continued education and support on Heart failure management . Education material has been mailed, not received yet.  Patient active with [facility] RN visit for heart failure management, initial visit in the last visit anticipate return visit in the next day, [facility] on today but patient had MD visit.  Diabetes  Patient continuing to monitor blood sugar 3 times daily, she reports taking medications as prescribed. Reports readings today 87-200 and 110.  Reinforced importance of blood sugar control in managing chronic conditions. Review importance of daily skin care, notifying MD of increased in redness, drainage to left leg wound.  Patient with pending left eye surgery, vitreous hemorrhage ,  after heart failure condition more stable.      Plan:  Will send visit note to PCP office Will plan return call in the next week as part of transition of care  Reviewed worsening symptoms of heart failure, increased  shortness of breath, swelling in legs, cough, chest discomfort  to notify PCP office for return visit  and 911 for emergency.   THN CM Care Plan Problem One     Most Recent Value  Care Plan Problem One  At risk for readmission recent hospital discharge related to Heart failure   Role Documenting the Problem One  Care Management Coordinator  Care Plan for Problem One  Active  [facility]   Patient will not experience a hospital readmission over the next 60 days   THN Long Term Goal Start Date  01/24/19  Interventions for Problem One Long Term Goal  Discussed current clinical state, reinforced adherence to MD medication changes notifying office of no improvement of swelling or worsening of conditon. Encouraged  elevating leg as much as possible to help with swelling.    THN CM Short Term Goal #1   Patient will attend all medical appointments over the next 14 days   THN CM Short Term Goal #1 Start Date  01/24/19  Interventions for Short Term Goal #1  Reviewed recent PCP visit , reinforced follow up as recommended.   THN CM Short Term Goal #2   Over the next 30 days patient will be verbalize at least 2 symptoms in yellow zone of heart failure   THN CM Short Term Goal #2 Start Date  01/24/19  Interventions for Short Term Goal #2  Emphasized importance of worsening symptoms of heart failure other than weight increase as she is unable to weigh. Increase in swelling , shortness of breath. Encouraged to begin to  review education handout when recieved    [facility] CM Short Term Goal #3  Patient will be able to identify high salt foods to avoid within the next 30 days   THN CM Short Term Goal #3 Start Date  01/31/19  Interventions for Short Tern Goal #3  Advised patient regarding high salt food and rationale of importance of limiting in diet, reviewed examples of salty snacks, can soups, processed meats, lunch meats foods that have hidden salt       Egbert Garibaldi, RN, [facility] Care Management,Care  Management Coordinator  [phone]- Mobile [phone]- Toll Free Main Office

## 2019-02-01 DIAGNOSIS — I2510 Atherosclerotic heart disease of native coronary artery without angina pectoris: Secondary | ICD-10-CM | POA: Diagnosis not present

## 2019-02-01 DIAGNOSIS — I130 Hypertensive heart and chronic kidney disease with heart failure and stage 1 through stage 4 chronic kidney disease, or unspecified chronic kidney disease: Secondary | ICD-10-CM | POA: Diagnosis not present

## 2019-02-01 DIAGNOSIS — I252 Old myocardial infarction: Secondary | ICD-10-CM | POA: Diagnosis not present

## 2019-02-01 DIAGNOSIS — E1122 Type 2 diabetes mellitus with diabetic chronic kidney disease: Secondary | ICD-10-CM | POA: Diagnosis not present

## 2019-02-01 DIAGNOSIS — I739 Peripheral vascular disease, unspecified: Secondary | ICD-10-CM | POA: Diagnosis not present

## 2019-02-01 DIAGNOSIS — E669 Obesity, unspecified: Secondary | ICD-10-CM | POA: Diagnosis not present

## 2019-02-01 DIAGNOSIS — J441 Chronic obstructive pulmonary disease with (acute) exacerbation: Secondary | ICD-10-CM | POA: Diagnosis not present

## 2019-02-01 DIAGNOSIS — N183 Chronic kidney disease, stage 3 (moderate): Secondary | ICD-10-CM | POA: Diagnosis not present

## 2019-02-01 DIAGNOSIS — G8929 Other chronic pain: Secondary | ICD-10-CM | POA: Diagnosis not present

## 2019-02-01 DIAGNOSIS — I5023 Acute on chronic systolic (congestive) heart failure: Secondary | ICD-10-CM | POA: Diagnosis not present

## 2019-02-02 DIAGNOSIS — Z79891 Long term (current) use of opiate analgesic: Secondary | ICD-10-CM | POA: Diagnosis not present

## 2019-02-02 DIAGNOSIS — M961 Postlaminectomy syndrome, not elsewhere classified: Secondary | ICD-10-CM | POA: Diagnosis not present

## 2019-02-02 DIAGNOSIS — M47816 Spondylosis without myelopathy or radiculopathy, lumbar region: Secondary | ICD-10-CM | POA: Diagnosis not present

## 2019-02-02 DIAGNOSIS — G894 Chronic pain syndrome: Secondary | ICD-10-CM | POA: Diagnosis not present

## 2019-02-02 DIAGNOSIS — M5416 Radiculopathy, lumbar region: Secondary | ICD-10-CM | POA: Diagnosis not present

## 2019-02-02 DIAGNOSIS — Z1389 Encounter for screening for other disorder: Secondary | ICD-10-CM | POA: Diagnosis not present

## 2019-02-06 DIAGNOSIS — Z794 Long term (current) use of insulin: Secondary | ICD-10-CM | POA: Diagnosis not present

## 2019-02-06 DIAGNOSIS — N179 Acute kidney failure, unspecified: Secondary | ICD-10-CM | POA: Diagnosis not present

## 2019-02-06 DIAGNOSIS — Z720 Tobacco use: Secondary | ICD-10-CM | POA: Diagnosis not present

## 2019-02-06 DIAGNOSIS — I10 Essential (primary) hypertension: Secondary | ICD-10-CM | POA: Diagnosis not present

## 2019-02-06 DIAGNOSIS — J440 Chronic obstructive pulmonary disease with acute lower respiratory infection: Secondary | ICD-10-CM | POA: Diagnosis not present

## 2019-02-06 DIAGNOSIS — R7989 Other specified abnormal findings of blood chemistry: Secondary | ICD-10-CM | POA: Diagnosis not present

## 2019-02-06 DIAGNOSIS — N390 Urinary tract infection, site not specified: Secondary | ICD-10-CM | POA: Diagnosis not present

## 2019-02-06 DIAGNOSIS — R05 Cough: Secondary | ICD-10-CM | POA: Diagnosis not present

## 2019-02-06 DIAGNOSIS — R0602 Shortness of breath: Secondary | ICD-10-CM | POA: Diagnosis not present

## 2019-02-06 DIAGNOSIS — Z20828 Contact with and (suspected) exposure to other viral communicable diseases: Secondary | ICD-10-CM | POA: Diagnosis not present

## 2019-02-06 DIAGNOSIS — E118 Type 2 diabetes mellitus with unspecified complications: Secondary | ICD-10-CM | POA: Diagnosis not present

## 2019-02-06 DIAGNOSIS — I252 Old myocardial infarction: Secondary | ICD-10-CM | POA: Diagnosis not present

## 2019-02-06 DIAGNOSIS — I498 Other specified cardiac arrhythmias: Secondary | ICD-10-CM | POA: Diagnosis not present

## 2019-02-06 DIAGNOSIS — I5043 Acute on chronic combined systolic (congestive) and diastolic (congestive) heart failure: Secondary | ICD-10-CM | POA: Diagnosis not present

## 2019-02-06 DIAGNOSIS — E1142 Type 2 diabetes mellitus with diabetic polyneuropathy: Secondary | ICD-10-CM | POA: Diagnosis not present

## 2019-02-06 DIAGNOSIS — R079 Chest pain, unspecified: Secondary | ICD-10-CM | POA: Diagnosis not present

## 2019-02-06 DIAGNOSIS — I255 Ischemic cardiomyopathy: Secondary | ICD-10-CM | POA: Diagnosis not present

## 2019-02-06 DIAGNOSIS — R9431 Abnormal electrocardiogram [ECG] [EKG]: Secondary | ICD-10-CM | POA: Diagnosis not present

## 2019-02-06 DIAGNOSIS — E1151 Type 2 diabetes mellitus with diabetic peripheral angiopathy without gangrene: Secondary | ICD-10-CM | POA: Diagnosis not present

## 2019-02-06 DIAGNOSIS — Z03818 Encounter for observation for suspected exposure to other biological agents ruled out: Secondary | ICD-10-CM | POA: Diagnosis not present

## 2019-02-06 DIAGNOSIS — I517 Cardiomegaly: Secondary | ICD-10-CM | POA: Diagnosis not present

## 2019-02-06 DIAGNOSIS — J449 Chronic obstructive pulmonary disease, unspecified: Secondary | ICD-10-CM | POA: Diagnosis not present

## 2019-02-06 DIAGNOSIS — I509 Heart failure, unspecified: Secondary | ICD-10-CM | POA: Diagnosis not present

## 2019-02-06 DIAGNOSIS — G8929 Other chronic pain: Secondary | ICD-10-CM | POA: Diagnosis not present

## 2019-02-06 DIAGNOSIS — I130 Hypertensive heart and chronic kidney disease with heart failure and stage 1 through stage 4 chronic kidney disease, or unspecified chronic kidney disease: Secondary | ICD-10-CM | POA: Diagnosis not present

## 2019-02-06 DIAGNOSIS — R0989 Other specified symptoms and signs involving the circulatory and respiratory systems: Secondary | ICD-10-CM | POA: Diagnosis not present

## 2019-02-06 DIAGNOSIS — R600 Localized edema: Secondary | ICD-10-CM | POA: Diagnosis not present

## 2019-02-06 DIAGNOSIS — E1143 Type 2 diabetes mellitus with diabetic autonomic (poly)neuropathy: Secondary | ICD-10-CM | POA: Diagnosis not present

## 2019-02-06 DIAGNOSIS — E1122 Type 2 diabetes mellitus with diabetic chronic kidney disease: Secondary | ICD-10-CM | POA: Diagnosis not present

## 2019-02-06 DIAGNOSIS — J9811 Atelectasis: Secondary | ICD-10-CM | POA: Diagnosis not present

## 2019-02-06 DIAGNOSIS — I5023 Acute on chronic systolic (congestive) heart failure: Secondary | ICD-10-CM | POA: Diagnosis not present

## 2019-02-06 DIAGNOSIS — I110 Hypertensive heart disease with heart failure: Secondary | ICD-10-CM | POA: Diagnosis not present

## 2019-02-06 DIAGNOSIS — J189 Pneumonia, unspecified organism: Secondary | ICD-10-CM | POA: Diagnosis not present

## 2019-02-06 DIAGNOSIS — J441 Chronic obstructive pulmonary disease with (acute) exacerbation: Secondary | ICD-10-CM | POA: Diagnosis not present

## 2019-02-06 DIAGNOSIS — N189 Chronic kidney disease, unspecified: Secondary | ICD-10-CM | POA: Diagnosis not present

## 2019-02-06 DIAGNOSIS — B9689 Other specified bacterial agents as the cause of diseases classified elsewhere: Secondary | ICD-10-CM | POA: Diagnosis not present

## 2019-02-06 DIAGNOSIS — J811 Chronic pulmonary edema: Secondary | ICD-10-CM | POA: Diagnosis not present

## 2019-02-06 DIAGNOSIS — I2510 Atherosclerotic heart disease of native coronary artery without angina pectoris: Secondary | ICD-10-CM | POA: Diagnosis not present

## 2019-02-06 DIAGNOSIS — H409 Unspecified glaucoma: Secondary | ICD-10-CM | POA: Diagnosis not present

## 2019-02-06 DIAGNOSIS — R69 Illness, unspecified: Secondary | ICD-10-CM | POA: Diagnosis not present

## 2019-02-06 DIAGNOSIS — K3184 Gastroparesis: Secondary | ICD-10-CM | POA: Diagnosis not present

## 2019-02-07 ENCOUNTER — Other Ambulatory Visit: Payer: Self-pay | Admitting: *Deleted

## 2019-02-07 ENCOUNTER — Ambulatory Visit: Payer: Self-pay | Admitting: *Deleted

## 2019-02-07 MED ORDER — ENOXAPARIN SODIUM 40 MG/0.4ML ~~LOC~~ SOLN
40.00 | SUBCUTANEOUS | Status: DC
Start: 2019-02-13 — End: 2019-02-07

## 2019-02-07 MED ORDER — GLUCOSE 40 % PO GEL
ORAL | Status: DC
Start: ? — End: 2019-02-07

## 2019-02-07 MED ORDER — BRIMONIDINE TARTRATE 0.2 % OP SOLN
1.00 | OPHTHALMIC | Status: DC
Start: 2019-02-13 — End: 2019-02-07

## 2019-02-07 MED ORDER — TARON A 35-1 & 300 MG PO MISC
120.00 | ORAL | Status: DC
Start: 2019-02-14 — End: 2019-02-07

## 2019-02-07 MED ORDER — ACETAMINOPHEN 325 MG PO TABS
650.00 | ORAL_TABLET | ORAL | Status: DC
Start: 2019-02-13 — End: 2019-02-07

## 2019-02-07 MED ORDER — FUROSEMIDE 10 MG/ML IJ SOLN
40.00 | INTRAMUSCULAR | Status: DC
Start: 2019-02-07 — End: 2019-02-07

## 2019-02-07 MED ORDER — GLUCAGON HCL (DIAGNOSTIC) 1 MG IJ SOLR
1.00 | INTRAMUSCULAR | Status: DC
Start: ? — End: 2019-02-07

## 2019-02-07 MED ORDER — PANTOPRAZOLE SODIUM 40 MG PO TBEC
40.00 | DELAYED_RELEASE_TABLET | ORAL | Status: DC
Start: 2019-02-14 — End: 2019-02-07

## 2019-02-07 MED ORDER — KETOROLAC TROMETHAMINE 0.5 % OP SOLN
1.00 | OPHTHALMIC | Status: DC
Start: 2019-02-13 — End: 2019-02-07

## 2019-02-07 MED ORDER — LOSARTAN POTASSIUM 50 MG PO TABS
50.00 | ORAL_TABLET | ORAL | Status: DC
Start: 2019-02-07 — End: 2019-02-07

## 2019-02-07 MED ORDER — ATORVASTATIN CALCIUM 40 MG PO TABS
40.00 | ORAL_TABLET | ORAL | Status: DC
Start: 2019-02-13 — End: 2019-02-07

## 2019-02-07 MED ORDER — ASPIRIN EC 81 MG PO TBEC
81.00 | DELAYED_RELEASE_TABLET | ORAL | Status: DC
Start: 2019-02-14 — End: 2019-02-07

## 2019-02-07 MED ORDER — GENERIC EXTERNAL MEDICATION
1.00 | Status: DC
Start: 2019-02-13 — End: 2019-02-07

## 2019-02-07 MED ORDER — METOPROLOL SUCCINATE ER 50 MG PO TB24
50.00 | ORAL_TABLET | ORAL | Status: DC
Start: 2019-02-14 — End: 2019-02-07

## 2019-02-07 MED ORDER — INSULIN GLARGINE 100 UNIT/ML ~~LOC~~ SOLN
40.00 | SUBCUTANEOUS | Status: DC
Start: 2019-02-13 — End: 2019-02-07

## 2019-02-07 MED ORDER — NICOTINE 14 MG/24HR TD PT24
1.00 | MEDICATED_PATCH | TRANSDERMAL | Status: DC
Start: 2019-02-14 — End: 2019-02-07

## 2019-02-07 MED ORDER — DEXTROSE 50 % IV SOLN
50.00 | INTRAVENOUS | Status: DC
Start: ? — End: 2019-02-07

## 2019-02-07 MED ORDER — ONDANSETRON HCL 4 MG/2ML IJ SOLN
4.00 | INTRAMUSCULAR | Status: DC
Start: ? — End: 2019-02-07

## 2019-02-07 MED ORDER — OXYCODONE HCL 5 MG PO TABS
10.00 | ORAL_TABLET | ORAL | Status: DC
Start: ? — End: 2019-02-07

## 2019-02-07 MED ORDER — IPRATROPIUM-ALBUTEROL 0.5-2.5 (3) MG/3ML IN SOLN
3.00 | RESPIRATORY_TRACT | Status: DC
Start: ? — End: 2019-02-07

## 2019-02-07 MED ORDER — SUBDUE PLUS PO LIQD
75.00 | ORAL | Status: DC
Start: 2019-02-14 — End: 2019-02-07

## 2019-02-07 MED ORDER — SACUBITRIL-VALSARTAN 24-26 MG PO TABS
1.00 | ORAL_TABLET | ORAL | Status: DC
Start: 2019-02-13 — End: 2019-02-07

## 2019-02-07 MED ORDER — GENERIC EXTERNAL MEDICATION
12.00 | Status: DC
Start: ? — End: 2019-02-07

## 2019-02-07 MED ORDER — GABAPENTIN 600 MG PO TABS
600.00 | ORAL_TABLET | ORAL | Status: DC
Start: 2019-02-13 — End: 2019-02-07

## 2019-02-07 MED ORDER — INSULIN REGULAR HUMAN 100 UNIT/ML IJ SOLN
0.00 | INTRAMUSCULAR | Status: DC
Start: 2019-02-13 — End: 2019-02-07

## 2019-02-07 MED ORDER — LACTOBACILLUS ACID-PECTIN PO CAPS
0.50 | ORAL_CAPSULE | ORAL | Status: DC
Start: ? — End: 2019-02-07

## 2019-02-07 NOTE — Patient Outreach (Signed)
[place] [facility]) Care Management  02/07/2019  Mindy Love 1955-08-13 [id number]   Care Coordination   Patient for scheduled transition of care call  for today, noted inpatient admission to [place] [facility] for Acute on Chronic Heart failure exacerbation admitted on 8/24.    Plan  Will follow progress for discharge disposition.    Joylene Draft, RN, [place] Management Coordinator  [phone]- Mobile [phone]- Toll Free Main Office

## 2019-02-09 MED ORDER — BUMETANIDE 1 MG PO TABS
2.00 | ORAL_TABLET | ORAL | Status: DC
Start: 2019-02-09 — End: 2019-02-09

## 2019-02-09 MED ORDER — GENERIC EXTERNAL MEDICATION
1.00 | Status: DC
Start: 2019-02-10 — End: 2019-02-09

## 2019-02-09 MED ORDER — MUPIROCIN 2 % EX OINT
TOPICAL_OINTMENT | CUTANEOUS | Status: DC
Start: 2019-02-13 — End: 2019-02-09

## 2019-02-14 ENCOUNTER — Other Ambulatory Visit: Payer: Self-pay | Admitting: *Deleted

## 2019-02-14 NOTE — Patient Outreach (Signed)
[place] [facility]) Care Management  02/14/2019  BRINDA FOCHT 1955/11/06 [id number]   Transition of care call  Referral received : 8/10 Referral source : [facility] hospital liaison Referral reason : patient with recent discharge from [facility] ,  Insurance : Holland Falling Medicare  63 year old female with admission to [facility] 8/4-01/20/19, Dx: Heart failure exacerbation. Per electronic record , PMHx; includes, Heart failure, EF 15-20% June 2020, Hypertension , COPD, IDDM, Right AKA, Chronic back pain.  Readmission to [facility] , 8/24-8/31 Dx: CHF exacerbation.   Subjective  Successful outreach call to patient, she reports feeling so much better. She discussed her recent admission to [facility]. She reports no swelling in her lower, she denies shortness of breath .  Patient able to discuss her new fluid restrictions orders of 1600 ml a day , reports receiving a measuring cup/pitcher with marking so she is able to keep track on amounts she is drinking.  She reports working on limiting salt in her diet , she is not adding salt in her diet and starting to read labels more,states not being instructed on how much per day, discussed low salt diet limits aim for less than 2000 mg a day. Discussed with patient worsening yellow zone symptoms of heart failure, increased swelling, shortness of breath, cough and action plan to, take medications as prescribed, notify MD for follow up and adjustment in plan . Patient discussed being on entresto and she has been instructed to monitor blood pressure and if it low to lay down and rest with feet above level of heart. She discussed reading today , 115/75 this am, after medication rechecked reading 99/45 she rested on sofa , and then rechecked it after a hour, it was back up to 115/65, she denies feeling short of breath or dizzy with episode.  Will benefit from continued education and support on heart failure  management .   She further discussed  Diabetes  Patient reports checking her blood sugar 4 times a day since being home and reports readings of 95 and 125 so far on today.  Patient denies having low blood sugar episode she is able to state  treatment plan of taking 15 gram of sugar,reports she usually uses 1/2 cup of orange juice, she is currently out of glucose tablet and plans to get more.   Appointments  Patient reports having office visit with [place] [facility]  in [place] on 9/8,  Dr. Gloriann Loan , PCP on 9/9.   Patient was active with [facility] health prior to admission , and plans to resume service she has not received call yet, has only been home 24 hours.    Plan Will continue weekly transition of care outreachs Will plan return call in next 4 business days to verify home health services in place.  Reviewed symptoms of worsening heart failure, low blood blood pressure or symptoms  to notify MD. Will send EMMI handout on low salt diet.   THN CM Care Plan Problem One     Most Recent Value  Care Plan Problem One  At risk for readmission related to  recent hospital discharge related to Heart failure   Role Documenting the Problem One  Care Management [place] for Problem One  Active  [facility] Long Term Goal   Patient will not experience a hospital readmission over the next 31 days   THN Long Term Goal Start Date  02/14/19 Billy Fischer due to readmission ]  Interventions for Problem One Long Term Goal  Review of recent discharge instructions, medicaiton changes  and worsening of symptoms to notify MD of sooner to avoid readmission   THN CM Short Term Goal #1   Patient will attend all medical appointments over the next 14 days   THN CM Short Term Goal #1 Start Date  02/14/19  Interventions for Short Term Goal #1  Discussed importance of prompt follow up with medical providers as recommended for review of medical plan , Verified transportation .   THN CM Short Term Goal #2    Over the next 30 days patient will be verbalize at least 2 symptoms in yellow zone of heart failure   THN CM Short Term Goal #2 Start Date  02/14/19  Interventions for Short Term Goal #2  Discussed symptoms of worsening of heart failure , increase in swellling , shortness of breath, cough chest discomfort. Reviewed action plan of notifying MD sooner .   THN CM Short Term Goal #3  Patient will able to discuss high salt food to limit in diet over the next 30 days   THN CM Short Term Goal #3 Start Date  02/14/19  Interventions for Short Tern Goal #3  Veriifed patient understanding of fluid restriction of 1600 ml a day and low salt diet . Reviewed reading label to identify amount of salt in items, reviewed amount of salt in low salt diet, , 2000mg . Reviewed fresh or frozen vegetables have less salt than canned if she used can vegetables to rinse them. Will send EMMI on lwo salt diet.   THN CM Short Term Goal #4  Over the next 30 days patient will be able to report monitoring blood pressure at least once daily and keeping a record .   THN CM Short Term Goal #4 Start Date  02/14/19  Interventions for Short Term Goal #4  Discussed with patient importance of moniitoring blood pressure with changes in medication, reviewed normal range of blood pressure and low readings examples <100/ or sympotms  reviewed symptoms of low blood pressure , dizziness , lightheadedness , increased tiredness, . Encouraged to keep a record of reading ,if she experiences episodes, rest on sofa with legs elevated, above level of heart and notfiy MD .       Egbert Garibaldi, RN, [facility] Care Management,Care Management Coordinator  [phone]- Mobile [phone]- Toll Free Main Office

## 2019-02-15 DIAGNOSIS — E1122 Type 2 diabetes mellitus with diabetic chronic kidney disease: Secondary | ICD-10-CM | POA: Diagnosis not present

## 2019-02-15 DIAGNOSIS — I739 Peripheral vascular disease, unspecified: Secondary | ICD-10-CM | POA: Diagnosis not present

## 2019-02-15 DIAGNOSIS — I5043 Acute on chronic combined systolic (congestive) and diastolic (congestive) heart failure: Secondary | ICD-10-CM | POA: Diagnosis not present

## 2019-02-15 DIAGNOSIS — K219 Gastro-esophageal reflux disease without esophagitis: Secondary | ICD-10-CM | POA: Diagnosis not present

## 2019-02-15 DIAGNOSIS — E669 Obesity, unspecified: Secondary | ICD-10-CM | POA: Diagnosis not present

## 2019-02-15 DIAGNOSIS — E1142 Type 2 diabetes mellitus with diabetic polyneuropathy: Secondary | ICD-10-CM | POA: Diagnosis not present

## 2019-02-15 DIAGNOSIS — J449 Chronic obstructive pulmonary disease, unspecified: Secondary | ICD-10-CM | POA: Diagnosis not present

## 2019-02-15 DIAGNOSIS — N183 Chronic kidney disease, stage 3 (moderate): Secondary | ICD-10-CM | POA: Diagnosis not present

## 2019-02-15 DIAGNOSIS — I130 Hypertensive heart and chronic kidney disease with heart failure and stage 1 through stage 4 chronic kidney disease, or unspecified chronic kidney disease: Secondary | ICD-10-CM | POA: Diagnosis not present

## 2019-02-15 DIAGNOSIS — I2510 Atherosclerotic heart disease of native coronary artery without angina pectoris: Secondary | ICD-10-CM | POA: Diagnosis not present

## 2019-02-17 ENCOUNTER — Other Ambulatory Visit: Payer: Self-pay | Admitting: *Deleted

## 2019-02-17 NOTE — Patient Outreach (Signed)
[place] [facility]) Care Management  02/17/2019  Mindy Love 1955/09/03 [id number]   Multidisciplinary case conference  Patient with less than 30 day readmission related to CHF exacerbation .   Patient case has been discussed in multidisciplinary case discussion .  Noted patient PCP not in [place], will follow patient after her PCP and cardiology visit in next week address any new concerns  and plan case closure .   Joylene Draft, RN, [place] Management Coordinator  [phone]- Mobile [phone]- Toll Free Main Office

## 2019-02-17 NOTE — Patient Outreach (Signed)
[place] [facility]) Care Management  02/17/2019  DARIN REDMANN 1955/09/21 [id number]   Care Coordination   Outreach call to [facility] home health to verify home health services in place, spoke with Dorian Pod that confirms patient initial RN  visit on 9/2, and next visit for 9/7.   Plan Will plan return call to patient in the next 4 business days.     Joylene Draft, RN, [place] Management Coordinator  [phone]- Mobile [phone]- Toll Free Main Office

## 2019-02-22 ENCOUNTER — Ambulatory Visit: Payer: Self-pay | Admitting: *Deleted

## 2019-02-22 DIAGNOSIS — I5040 Unspecified combined systolic (congestive) and diastolic (congestive) heart failure: Secondary | ICD-10-CM | POA: Diagnosis not present

## 2019-02-22 DIAGNOSIS — N183 Chronic kidney disease, stage 3 (moderate): Secondary | ICD-10-CM | POA: Diagnosis not present

## 2019-02-22 DIAGNOSIS — E119 Type 2 diabetes mellitus without complications: Secondary | ICD-10-CM | POA: Diagnosis not present

## 2019-02-22 DIAGNOSIS — N3944 Nocturnal enuresis: Secondary | ICD-10-CM | POA: Diagnosis not present

## 2019-02-22 DIAGNOSIS — N3946 Mixed incontinence: Secondary | ICD-10-CM | POA: Diagnosis not present

## 2019-02-22 DIAGNOSIS — I10 Essential (primary) hypertension: Secondary | ICD-10-CM | POA: Diagnosis not present

## 2019-02-23 ENCOUNTER — Other Ambulatory Visit: Payer: Self-pay | Admitting: *Deleted

## 2019-02-23 NOTE — Patient Outreach (Signed)
[place] [facility]) Care Management  02/23/2019  Mindy Love [birth year] [id number]   Telephone assessment  Case Closure    Subjective  Successful outreach call to patient she discussed feeling pretty good on today. She discussed post discharge visit with PCP on yesterday, no new changes. She denies shortness of breath , chest pain or cough, she reports leg slightly swollen much decreased from previous. She discussed continuing to limit fluid intake to 1600 ml a day and limit salt in food choices, not adding salt , using fresh or frozen vegetables. Patient does continue to smoke,states that she is cutting back, discussed benefits of quitting smoke she denies addition education resource information stating that she has plenty.  Patient states that her new fluid pill is working well. She discussed having to reschedule her cardiology office visit until next week.   Patient further discussed : Diabetes she report blood sugar doing some better reports reading today is 180. She continues to check at least 3 times a day. She denies having low blood sugar readings , she is able to teach back on symptoms and how to treat.   Patient discussed having to cancel her home health RN visit on today , due to concern regarding her son being sick and possible coronavirus, they have been instructed to quarantine at this time, he is currently at the hospital.  Discussed with patient importance of handwashing, maintaining social distancing and mask. Discussed importance of her adhering to her medical plan, taking medications as prescribed, monitoring blood sugars, blood pressures. Reviewed symptoms of covid19, shortness of breath cough, sore thoart, fever temperature over 100.4, diarrhea, loss of taste, urged to notify her MD if symptoms occur due to having high risk medical conditions.   Explained to patient it has been identified that her primary care provider is not a [facility] primary care provider, not [facility]  affiliated  , patient is not eligible for [facility] care management services. Explained to patient that this does not interfere with her home health services with [place] home [facility] this will continue.   Plan Will close patient case and  Will send Primary care provider case closure letter. Advised patient seeking MD follow up for worsening Heart failure symptoms, increased swelling shortness of breath swelling, cough chest pain or other concerns/symptoms  sooner.      Joylene Draft, RN, [place] Management Coordinator  [phone]- Mobile [phone]- Toll Free Main Office

## 2019-02-24 ENCOUNTER — Ambulatory Visit: Payer: Medicare HMO | Admitting: Cardiology

## 2019-03-08 DIAGNOSIS — I2510 Atherosclerotic heart disease of native coronary artery without angina pectoris: Secondary | ICD-10-CM | POA: Diagnosis not present

## 2019-03-08 DIAGNOSIS — E1142 Type 2 diabetes mellitus with diabetic polyneuropathy: Secondary | ICD-10-CM | POA: Diagnosis not present

## 2019-03-08 DIAGNOSIS — I130 Hypertensive heart and chronic kidney disease with heart failure and stage 1 through stage 4 chronic kidney disease, or unspecified chronic kidney disease: Secondary | ICD-10-CM | POA: Diagnosis not present

## 2019-03-08 DIAGNOSIS — N183 Chronic kidney disease, stage 3 (moderate): Secondary | ICD-10-CM | POA: Diagnosis not present

## 2019-03-08 DIAGNOSIS — I5043 Acute on chronic combined systolic (congestive) and diastolic (congestive) heart failure: Secondary | ICD-10-CM | POA: Diagnosis not present

## 2019-03-08 DIAGNOSIS — E1122 Type 2 diabetes mellitus with diabetic chronic kidney disease: Secondary | ICD-10-CM | POA: Diagnosis not present

## 2019-03-08 DIAGNOSIS — K219 Gastro-esophageal reflux disease without esophagitis: Secondary | ICD-10-CM | POA: Diagnosis not present

## 2019-03-08 DIAGNOSIS — I739 Peripheral vascular disease, unspecified: Secondary | ICD-10-CM | POA: Diagnosis not present

## 2019-03-08 DIAGNOSIS — J449 Chronic obstructive pulmonary disease, unspecified: Secondary | ICD-10-CM | POA: Diagnosis not present

## 2019-03-08 DIAGNOSIS — E669 Obesity, unspecified: Secondary | ICD-10-CM | POA: Diagnosis not present

## 2019-03-13 DIAGNOSIS — M47816 Spondylosis without myelopathy or radiculopathy, lumbar region: Secondary | ICD-10-CM | POA: Diagnosis not present

## 2019-03-13 DIAGNOSIS — Z1389 Encounter for screening for other disorder: Secondary | ICD-10-CM | POA: Diagnosis not present

## 2019-03-13 DIAGNOSIS — M5416 Radiculopathy, lumbar region: Secondary | ICD-10-CM | POA: Diagnosis not present

## 2019-03-13 DIAGNOSIS — Z79891 Long term (current) use of opiate analgesic: Secondary | ICD-10-CM | POA: Diagnosis not present

## 2019-03-13 DIAGNOSIS — M961 Postlaminectomy syndrome, not elsewhere classified: Secondary | ICD-10-CM | POA: Diagnosis not present

## 2019-03-13 DIAGNOSIS — G894 Chronic pain syndrome: Secondary | ICD-10-CM | POA: Diagnosis not present

## 2019-03-21 ENCOUNTER — Ambulatory Visit (INDEPENDENT_AMBULATORY_CARE_PROVIDER_SITE_OTHER): Payer: Medicare HMO | Admitting: Cardiology

## 2019-03-21 ENCOUNTER — Other Ambulatory Visit: Payer: Self-pay

## 2019-03-21 ENCOUNTER — Encounter: Payer: Self-pay | Admitting: Cardiology

## 2019-03-21 VITALS — BP 110/80 | HR 103 | Ht 62.0 in

## 2019-03-21 DIAGNOSIS — R0600 Dyspnea, unspecified: Secondary | ICD-10-CM

## 2019-03-21 DIAGNOSIS — E119 Type 2 diabetes mellitus without complications: Secondary | ICD-10-CM

## 2019-03-21 DIAGNOSIS — Z89619 Acquired absence of unspecified leg above knee: Secondary | ICD-10-CM | POA: Diagnosis not present

## 2019-03-21 DIAGNOSIS — I428 Other cardiomyopathies: Secondary | ICD-10-CM

## 2019-03-21 DIAGNOSIS — I10 Essential (primary) hypertension: Secondary | ICD-10-CM | POA: Diagnosis not present

## 2019-03-21 DIAGNOSIS — I739 Peripheral vascular disease, unspecified: Secondary | ICD-10-CM | POA: Diagnosis not present

## 2019-03-21 DIAGNOSIS — R0609 Other forms of dyspnea: Secondary | ICD-10-CM

## 2019-03-21 DIAGNOSIS — E782 Mixed hyperlipidemia: Secondary | ICD-10-CM | POA: Diagnosis not present

## 2019-03-21 DIAGNOSIS — E1159 Type 2 diabetes mellitus with other circulatory complications: Secondary | ICD-10-CM

## 2019-03-21 LAB — BASIC METABOLIC PANEL
BUN/Creatinine Ratio: 17 (ref 12–28)
BUN: 18 mg/dL (ref 8–27)
CO2: 27 mmol/L (ref 20–29)
Calcium: 8.7 mg/dL (ref 8.7–10.3)
Chloride: 96 mmol/L (ref 96–106)
Creatinine, Ser: 1.04 mg/dL — ABNORMAL HIGH (ref 0.57–1.00)
GFR calc Af Amer: 67 mL/min/{1.73_m2} (ref >59)
GFR calc non Af Amer: 58 mL/min/{1.73_m2} — ABNORMAL LOW (ref >59)
Glucose: 304 mg/dL — ABNORMAL HIGH (ref 65–99)
Potassium: 4.9 mmol/L (ref 3.5–5.2)
Sodium: 136 mmol/L (ref 134–144)

## 2019-03-21 MED ORDER — METOLAZONE 2.5 MG PO TABS: 2.5000 mg | ORAL_TABLET | Freq: Every day | ORAL | 0 refills | Status: DC

## 2019-03-21 NOTE — Progress Notes (Signed)
Cardiology Office Note:    Date:  03/21/2019   ID:  Mindy LikensDolly C Love, DOB December 18, 1955, MRN [id number]  PCP:  Kendell BaneBell, William, MD  Cardiologist:  Garwin Brothersajan R Kareema Keitt, MD   Referring MD: Kendell BaneBell, William, MD    ASSESSMENT:    1. Nonischemic cardiomyopathy (HCC) -stress-induced versus myocarditis   2. Diabetes mellitus with coincident hypertension (HCC)   3. Above-knee amputee, unspecified laterality (HCC)   4. Mixed hyperlipidemia   5. Severe peripheral arterial disease (HCC)   6. Type 2 diabetes mellitus with vascular disease (HCC)    PLAN:    In order of problems listed above:  1. Coronary artery disease: I discussed my findings with the patient at extensive length.  Her symptoms suggest congestive heart failure.  Echocardiogram will be done to assess left ventricular systolic function.  I asked her to stop her bumetanide and she will start metolazone 2.5 mg daily for a week.  This is a medicine change only for 1 week.  Subsequently she will switch back to her original medication.  She will have a Chem-7 today and in a week.  She will weigh herself regularly.  Diet including salt and fluid and instructions were given to the patient and she vocalized understanding. 2. Essential hypertension: Blood pressure stable 3. Diet was discussed for dyslipidemia and diabetes mellitus and weight reduction was stressed 4. Cigarette smoker: I spent 5 minutes with the patient discussing solely about smoking. Smoking cessation was counseled. I suggested to the patient also different medications and pharmacological interventions. Patient is keen to try stopping on its own at this time. He will get back to me if he needs any further assistance in this matter. 5. Patient will be seen in follow-up appointment in 2 to 3 weeks or earlier if the patient has any concerns 6. She knows to go to the nearest emergency room for any concerning symptoms.   Medication Adjustments/Labs and Tests Ordered: Current medicines are  reviewed at length with the patient today.  Concerns regarding medicines are outlined above.  No orders of the defined types were placed in this encounter.  No orders of the defined types were placed in this encounter.    Chief Complaint  Patient presents with  . Follow-up     History of Present Illness:    Mindy Love is a 63 y.o. female.  Patient has history of mildly depressed ejection fraction, coronary artery disease, essential hypertension, dyslipidemia, diabetes mellitus and unfortunately continues to smoke.  She has had amputations.  She mentions to me that she is having leg swelling.  She was admitted to [facility] and her medications was changed from furosemide to bumetanide.  At the time of my evaluation, the patient is alert awake oriented and in no distress.  She gives history suggesting of some orthopnea.  No PND.  No chest pain.  At the time of my evaluation, the patient is alert awake oriented and in no distress.  Past Medical History:  Diagnosis Date  . Anxiety   . CAD, multiple vessel 03/2011   Status post CABG x4 - LIMA-LAD, SVG-DIAG, SVG-OM, SVG-dRCA.([facility])  . Chronic diastolic CHF (congestive heart failure), NYHA class 2 (HCC)    With mild systolic dysfunction by last echo  . Depression   . Diabetes mellitus   . Frequency   . GERD (gastroesophageal reflux disease)   . Herniated disc   . Hyperlipidemia   . Myocardial infarction (HCC) 10/12   OV Dr Tomie Chinaevankar  12/22/11 with clearance note on chart , EKG  11/12 on chart  . Paroxysmal atrial fibrillation (HCC)    Documented during COPD exacerbation hospitalization in 2018    Past Surgical History:  Procedure Laterality Date  .  NASAL SURG    . APPENDECTOMY    . BACK SURGERY    . CARDIAC CATHETERIZATION  10/12  . CARDIAC STENTS  01/2011   Unknown details, but would be prior to CABG; BMS prior to recurrent Angina leading to CABG.  . CORONARY ARTERY BYPASS GRAFT  X 4 VESSELS 2012   [facility]: LIMA-LAD, SVG-DIAG, SVG-OM, SVGdRCA  . EYE SURGERY     bilateral lasik  . LUMBAR LAMINECTOMY/DECOMPRESSION MICRODISCECTOMY  06/25/2011   Procedure: LUMBAR LAMINECTOMY/DECOMPRESSION MICRODISCECTOMY;  Surgeon: Tobi Bastos;  Location: [facility];  Service: Orthopedics;  Laterality: Right;  Lumbar Hemi Laminectomy L5 - S1 on the Right/Microdiscectomy on the Right  (X-Ray  . LUMBAR LAMINECTOMY/DECOMPRESSION MICRODISCECTOMY  12/23/2011   Procedure: LUMBAR LAMINECTOMY/DECOMPRESSION MICRODISCECTOMY;  Surgeon: Tobi Bastos, MD;  Location: [facility];  Service: Orthopedics;  Laterality: Right;  Hemi Laminectomy, Microdiscectomy L5-S1 on right  . OOPHORECTOMY    . right above knee amputation    . RIGHT/LEFT HEART CATH AND CORONARY/GRAFT ANGIOGRAPHY N/A 09/16/2018   Procedure: RIGHT/LEFT HEART CATH AND CORONARY/GRAFT ANGIOGRAPHY;  Surgeon: Martinique, Peter M, MD;  Location: [facility];  Service: Cardiovascular;  Laterality: N/A;  . ROTATOR CUFF REPAIR  right  . TRANSTHORACIC ECHOCARDIOGRAM  12/2016   Mild LV dilation.  Mild LVH.  Mildly decreased EF of 45 to 50% with postsurgical hypokinesis of the interventricular septum consistent with CABG.  Severe abnormal diastolic filling (grade 2/pseudonormalization).  Moderate-severe LA dilation.  Mild MR.  Mild aortic sclerosis but no stenosis.    Current Medications: Current Meds  Medication Sig  . aspirin EC 81 MG tablet Take 81 mg by mouth daily after breakfast. States LD 12/16/11  . atorvastatin (LIPITOR) 20 MG tablet Take 20 mg by mouth daily.  . brimonidine (ALPHAGAN) 0.2 % ophthalmic solution Place 1 drop into the left eye 3 (three) times daily.  . bumetanide (BUMEX) 1 MG tablet Take 1 mg by mouth daily. Begin on 02/15/19.  Marland Kitchen clopidogrel (PLAVIX) 75 MG tablet Take 75 mg by mouth daily.  . DULoxetine (CYMBALTA) 60 MG capsule Take 60 mg by mouth 2 (two) times daily.  Marland Kitchen gabapentin (NEURONTIN) 600 MG tablet Take 600 mg by mouth 3  (three) times daily.  . insulin aspart (NOVOLOG) 100 UNIT/ML injection Inject 1-10 Units into the skin See admin instructions. Sliding scale per chart for each meal, 201-250- 2 units, 251-300 4 units, 301-350- 6 units, 351-400- 8 units, 401-450, 10 units and call MD.  . insulin degludec (TRESIBA FLEXTOUCH) 100 UNIT/ML SOPN FlexTouch Pen 40 Units at bedtime.  Marland Kitchen ketorolac (ACULAR) 0.5 % ophthalmic solution PLACE ONE DROP IN THE LEFT EYE FOUR TIMES DAILY  . metoprolol succinate (TOPROL-XL) 25 MG 24 hr tablet Take 25 mg by mouth daily.  . nitroGLYCERIN (NITROSTAT) 0.4 MG SL tablet Place 0.4 mg under the tongue every 5 (five) minutes as needed. Chest pain  . Oxycodone HCl 10 MG TABS Take 10 mg by mouth 5 (five) times daily.  . pantoprazole sodium (PROTONIX) 40 mg/20 mL PACK Take by mouth.  . sacubitril-valsartan (ENTRESTO) 24-26 MG Take 1 tablet by mouth 2 (two) times daily.  . timolol (TIMOPTIC) 0.5 % ophthalmic solution Apply 1 drop to eye 2 (two)  times daily.     Allergies:   Other, Sulfasalazine, Codeine, Jardiance [empagliflozin], Sulfa antibiotics, and Tramadol   Social History   Socioeconomic History  . Marital status: Married    Spouse name: Not on file  . Number of children: Not on file  . Years of education: Not on file  . Highest education level: Not on file  Occupational History  . Not on file  Social Needs  . Financial resource strain: Not on file  . Food insecurity    Worry: Not on file    Inability: Not on file  . Transportation needs    Medical: Not on file    Non-medical: Not on file  Tobacco Use  . Smoking status: Current Every Day Smoker    Packs/day: 1.00    Types: Cigarettes    Last attempt to quit: 03/23/2009    Years since quitting: 10.0  . Smokeless tobacco: Never Used  Substance and Sexual Activity  . Alcohol use: No  . Drug use: No  . Sexual activity: Not on file  Lifestyle  . Physical activity    Days per week: 0 days    Minutes per session: 0 min   . Stress: Only a little  Relationships  . Social Musician on phone: Not on file    Gets together: Not on file    Attends religious service: Not on file    Active member of club or organization: Not on file    Attends meetings of clubs or organizations: Not on file    Relationship status: Not on file  Other Topics Concern  . Not on file  Social History Narrative  . Not on file     Family History: The patient's family history includes Hyperlipidemia in her father; Hypertension in her father.  ROS:   Please see the history of present illness.    All other systems reviewed and are negative.  EKGs/Labs/Other Studies Reviewed:    The following studies were reviewed today: 1. 3 vessel occlusive CAD.     -100% LAD after a tiny first diagonal    - 100% OM 1    - 100% proximal to mid RCA at site of prior stent 2. Patent LIMA to the LAD 3. Patent sequential SVG to the second diagonal and OM 1 4. Patent SVG to the distal RCA 5. Moderate LV dysfunction. EF estimated at 40-45% 6. Mildly elevated LV filling pressures 7. Normal right heart pressures.  8. Good cardiac output.  Plan: continue medical therapy.    IMPRESSIONS    1. The left ventricle has severely reduced systolic function, with an ejection fraction of 25-30%. The cavity size was moderately dilated. There is mildly increased left ventricular wall thickness. Left ventricular diastolic Doppler parameters are  consistent with pseudonormalization. Elevated left ventricular end-diastolic pressure.  2. The right ventricle has normal systolic function. The cavity was normal. There is no increase in right ventricular wall thickness.  3. Left atrial size was moderately dilated.  4. The mitral valve is degenerative. Moderate thickening of the mitral valve leaflet. Moderate calcification of the mitral valve leaflet. There is moderate mitral annular calcification present.  5. The aortic valve is tricuspid. Severely  thickening of the aortic valve. Sclerosis without any evidence of stenosis of the aortic valve. Aortic valve regurgitation is mild by color flow Doppler.    Recent Labs: 09/17/2018: ALT 17 09/19/2018: BUN 39; Creatinine, Ser 1.06; Hemoglobin 12.8; Magnesium 2.2; Platelets 419; Potassium 4.9;  Sodium 139  Recent Lipid Panel    Component Value Date/Time   CHOL 124 09/17/2018 0248   TRIG 62 09/17/2018 0248   HDL 32 (L) 09/17/2018 0248   CHOLHDL 3.9 09/17/2018 0248   VLDL 12 09/17/2018 0248   LDLCALC 80 09/17/2018 0248    Physical Exam:    VS:  BP 110/80 (BP Location: Left Arm, Patient Position: Sitting, Cuff Size: Normal)   Pulse (!) 103   Ht 5\' 2"  (1.575 m)   SpO2 92%   BMI 37.49 kg/m     Wt Readings from Last 3 Encounters:  01/31/19 205 lb (93 kg)  12/21/18 195 lb (88.5 kg)  12/07/18 200 lb (90.7 kg)     GEN: Patient is in no acute distress HEENT: Normal NECK: No JVD; No carotid bruits LYMPHATICS: No lymphadenopathy CARDIAC: Hear sounds regular, 2/6 systolic murmur at the apex. RESPIRATORY:  Clear to auscultation without rales, wheezing or rhonchi  ABDOMEN: Soft, non-tender, non-distended MUSCULOSKELETAL:  No edema; No deformity  SKIN: Warm and dry NEUROLOGIC:  Alert and oriented x 3 PSYCHIATRIC:  Normal affect   Signed, 12/09/18, MD  03/21/2019 10:35 AM    [facility]

## 2019-03-21 NOTE — Patient Instructions (Signed)
Medication Instructions:  Your physician has recommended you make the following change in your medication: STOP taking bumex today and restart on 03/29/19  START taking metalozone 2.5 mg (1 tablet) once daily for the next 7 days ONLY  If you need a refill on your cardiac medications before your next appointment, please call your pharmacy.   Lab work: Your physician recommends that you have a BMP drawn today.  If you have labs (blood work) drawn today and your tests are completely normal, you will receive your results only by: Marland Kitchen MyChart Message (if you have MyChart) OR . A paper copy in the mail If you have any lab test that is abnormal or we need to change your treatment, we will call you to review the results.  Testing/Procedures: Your physician has requested that you have an echocardiogram. Echocardiography is a painless test that uses sound waves to create images of your heart. It provides your doctor with information about the size and shape of your heart and how well your heart's chambers and valves are working. This procedure takes approximately one hour. There are no restrictions for this procedure.    Follow-Up: At [facility], you and your health needs are our priority.  As part of our continuing mission to provide you with exceptional heart care, we have created designated Provider Care Teams.  These Care Teams include your primary Cardiologist (physician) and Advanced Practice Providers (APPs -  Physician Assistants and Nurse Practitioners) who all work together to provide you with the care you need, when you need it. You will need a follow up appointment in 2 weeks.    Any Other Special Instructions Will Be Listed Below  Echocardiogram An echocardiogram is a procedure that uses painless sound waves (ultrasound) to produce an image of the heart. Images from an echocardiogram can provide important information about:  Signs of coronary artery disease (CAD).  Aneurysm  detection. An aneurysm is a weak or damaged part of an artery wall that bulges out from the normal force of blood pumping through the body.  Heart size and shape. Changes in the size or shape of the heart can be associated with certain conditions, including heart failure, aneurysm, and CAD.  Heart muscle function.  Heart valve function.  Signs of a past heart attack.  Fluid buildup around the heart.  Thickening of the heart muscle.  A tumor or infectious growth around the heart valves. Tell a health care provider about:  Any allergies you have.  All medicines you are taking, including vitamins, herbs, eye drops, creams, and over-the-counter medicines.  Any blood disorders you have.  Any surgeries you have had.  Any medical conditions you have.  Whether you are pregnant or may be pregnant. What are the risks? Generally, this is a safe procedure. However, problems may occur, including:  Allergic reaction to dye (contrast) that may be used during the procedure. What happens before the procedure? No specific preparation is needed. You may eat and drink normally. What happens during the procedure?   An IV tube may be inserted into one of your veins.  You may receive contrast through this tube. A contrast is an injection that improves the quality of the pictures from your heart.  A gel will be applied to your chest.  A wand-like tool (transducer) will be moved over your chest. The gel will help to transmit the sound waves from the transducer.  The sound waves will harmlessly bounce off of your heart to allow  the heart images to be captured in real-time motion. The images will be recorded on a computer. The procedure may vary among health care providers and hospitals. What happens after the procedure?  You may return to your normal, everyday life, including diet, activities, and medicines, unless your health care provider tells you not to do that. Summary  An  echocardiogram is a procedure that uses painless sound waves (ultrasound) to produce an image of the heart.  Images from an echocardiogram can provide important information about the size and shape of your heart, heart muscle function, heart valve function, and fluid buildup around your heart.  You do not need to do anything to prepare before this procedure. You may eat and drink normally.  After the echocardiogram is completed, you may return to your normal, everyday life, unless your health care provider tells you not to do that. This information is not intended to replace advice given to you by your health care provider. Make sure you discuss any questions you have with your health care provider. Document Released: 05/29/2000 Document Revised: 09/22/2018 Document Reviewed: 07/04/2016 Elsevier Patient Education  2020 [place]. Metolazone tablets What is this medicine? METOLAZONE (me TOLE a zone) is a diuretic. It increases the amount of urine passed, which causes the body to lose salt and water. This medicine is used to treat high blood pressure. It is also reduces the swelling and water retention caused by heart or kidney disease. This medicine may be used for other purposes; ask your health care provider or pharmacist if you have questions. COMMON BRAND NAME(S): Mykrox, Zaroxolyn What should I tell my health care provider before I take this medicine? They need to know if you have any of these conditions:  diabetes  gout  immune system problems, like lupus  kidney disease  liver disease  pancreatitis  small amount of urine or difficulty passing urine  an unusual or allergic reaction to metolazone, sulfa drugs, other medicines, foods, dyes, or preservatives  pregnant or trying to get pregnant  breast-feeding How should I use this medicine? Take this medicine by mouth with a glass of water. Follow the directions on the prescription label. Remember that you will need to  pass urine frequently after taking this medicine. Do not take your doses at a time of day that will cause you problems. Do not take at bedtime. Take your medicine at regular intervals. Do not take your medicine more often than directed. Do not stop taking except on your doctor's advice. Talk to your pediatrician regarding the use of this medicine in children. Special care may be needed. Overdosage: If you think you have taken too much of this medicine contact a poison control center or emergency room at once. NOTE: This medicine is only for you. Do not share this medicine with others. What if I miss a dose? If you miss a dose, take it as soon as you can. If it is almost time for your next dose, take only that dose. Do not take double or extra doses. What may interact with this medicine?  alcohol  antiinflammatory drugs for pain or swelling  barbiturates for sleep or seizure control  digoxin  dofetilide  lithium  medicines for blood sugar  medicines for high blood pressure  medicines that relax muscles for surgery  methenamine  other diuretics  some medicines for pain  steroid hormones like cortisone, hydrocortisone, and prednisone  warfarin This list may not describe all possible interactions. Give your health care  provider a list of all the medicines, herbs, non-prescription drugs, or dietary supplements you use. Also tell them if you smoke, drink alcohol, or use illegal drugs. Some items may interact with your medicine. What should I watch for while using this medicine? Visit your doctor or health care professional for regular checks on your progress. Check your blood pressure as directed. Ask your doctor or health care professional what your blood pressure should be and when you should contact him or her. You may need to be on a special diet while taking this medicine. Ask your doctor. Check with your doctor or health care professional if you get an attack of severe  diarrhea, nausea and vomiting, or if you sweat a lot. The loss of too much body fluid can make it dangerous for you to take this medicine. You may get drowsy or dizzy. Do not drive, use machinery, or do anything that needs mental alertness until you know how this medicine affects you. Do not stand or sit up quickly, especially if you are an older patient. This reduces the risk of dizzy or fainting spells. Alcohol may interfere with the effect of this medicine. Avoid alcoholic drinks. This medicine may affect your blood sugar level. If you have diabetes, check with your doctor or health care professional before changing the dose of your diabetic medicine. This medicine can make you more sensitive to the sun. Keep out of the sun. If you cannot avoid being in the sun, wear protective clothing and use sunscreen. Do not use sun lamps or tanning beds/booths. What side effects may I notice from receiving this medicine? Side effects that you should report to your doctor or health care professional as soon as possible:  allergic reactions such as skin rash or itching, hives, swelling of the lips, mouth, tongue, or throat  fast or irregular heartbeat, chest pain  feeling faint  fever, chills  gout pain  hot red lump on leg  muscle pain, cramps  nausea, vomiting  numbness or tingling in hands, feet  pain or difficulty when passing urine  redness, blistering, peeling or loosening of the skin, including inside the mouth  unusual bleeding or bruising  unusually weak or tired  yellowing of the eyes, skin Side effects that usually do not require medical attention (report to your doctor or health care professional if they continue or are bothersome):  abdominal pain  blurred vision  constipation or diarrhea  dry mouth  headache This list may not describe all possible side effects. Call your doctor for medical advice about side effects. You may report side effects to FDA at 1-800-FDA-1088.  Where should I keep my medicine? Keep out of the reach of children. Store at room temperature between 15 and 30 degrees C (59 and 86 degrees F). Protect from light. Keep container tightly closed. Throw away any unused medicine after the expiration date. NOTE: This sheet is a summary. It may not cover all possible information. If you have questions about this medicine, talk to your doctor, pharmacist, or health care provider.  2020 Elsevier/Gold Standard (2007-12-19 14:11:48)

## 2019-03-22 DIAGNOSIS — J449 Chronic obstructive pulmonary disease, unspecified: Secondary | ICD-10-CM | POA: Diagnosis not present

## 2019-03-22 DIAGNOSIS — K219 Gastro-esophageal reflux disease without esophagitis: Secondary | ICD-10-CM | POA: Diagnosis not present

## 2019-03-22 DIAGNOSIS — E669 Obesity, unspecified: Secondary | ICD-10-CM | POA: Diagnosis not present

## 2019-03-22 DIAGNOSIS — N1831 Chronic kidney disease, stage 3a: Secondary | ICD-10-CM | POA: Diagnosis not present

## 2019-03-22 DIAGNOSIS — E1122 Type 2 diabetes mellitus with diabetic chronic kidney disease: Secondary | ICD-10-CM | POA: Diagnosis not present

## 2019-03-22 DIAGNOSIS — I130 Hypertensive heart and chronic kidney disease with heart failure and stage 1 through stage 4 chronic kidney disease, or unspecified chronic kidney disease: Secondary | ICD-10-CM | POA: Diagnosis not present

## 2019-03-22 DIAGNOSIS — I2510 Atherosclerotic heart disease of native coronary artery without angina pectoris: Secondary | ICD-10-CM | POA: Diagnosis not present

## 2019-03-22 DIAGNOSIS — I5043 Acute on chronic combined systolic (congestive) and diastolic (congestive) heart failure: Secondary | ICD-10-CM | POA: Diagnosis not present

## 2019-03-22 DIAGNOSIS — I739 Peripheral vascular disease, unspecified: Secondary | ICD-10-CM | POA: Diagnosis not present

## 2019-03-22 DIAGNOSIS — E1142 Type 2 diabetes mellitus with diabetic polyneuropathy: Secondary | ICD-10-CM | POA: Diagnosis not present

## 2019-03-27 DIAGNOSIS — Z1389 Encounter for screening for other disorder: Secondary | ICD-10-CM | POA: Diagnosis not present

## 2019-03-27 DIAGNOSIS — M5416 Radiculopathy, lumbar region: Secondary | ICD-10-CM | POA: Diagnosis not present

## 2019-03-27 DIAGNOSIS — Z79891 Long term (current) use of opiate analgesic: Secondary | ICD-10-CM | POA: Diagnosis not present

## 2019-03-27 DIAGNOSIS — M47816 Spondylosis without myelopathy or radiculopathy, lumbar region: Secondary | ICD-10-CM | POA: Diagnosis not present

## 2019-03-27 DIAGNOSIS — M961 Postlaminectomy syndrome, not elsewhere classified: Secondary | ICD-10-CM | POA: Diagnosis not present

## 2019-03-27 DIAGNOSIS — G894 Chronic pain syndrome: Secondary | ICD-10-CM | POA: Diagnosis not present

## 2019-03-28 ENCOUNTER — Telehealth: Payer: Self-pay

## 2019-03-28 NOTE — Telephone Encounter (Signed)
-----   Message from Jenean Lindau, MD sent at 03/21/2019  4:52 PM EDT ----- Labs are fine sugars are elevated she needs to talk to primary care about this. Jenean Lindau, MD 03/21/2019 4:52 PM

## 2019-03-28 NOTE — Telephone Encounter (Signed)
Results relayed copy sent to Dr. Gloriann Loan.

## 2019-04-24 DIAGNOSIS — G894 Chronic pain syndrome: Secondary | ICD-10-CM | POA: Diagnosis not present

## 2019-04-24 DIAGNOSIS — M961 Postlaminectomy syndrome, not elsewhere classified: Secondary | ICD-10-CM | POA: Diagnosis not present

## 2019-04-24 DIAGNOSIS — M47816 Spondylosis without myelopathy or radiculopathy, lumbar region: Secondary | ICD-10-CM | POA: Diagnosis not present

## 2019-04-24 DIAGNOSIS — M5416 Radiculopathy, lumbar region: Secondary | ICD-10-CM | POA: Diagnosis not present

## 2019-04-24 DIAGNOSIS — Z79891 Long term (current) use of opiate analgesic: Secondary | ICD-10-CM | POA: Diagnosis not present

## 2019-04-24 DIAGNOSIS — Z1389 Encounter for screening for other disorder: Secondary | ICD-10-CM | POA: Diagnosis not present

## 2019-04-26 ENCOUNTER — Other Ambulatory Visit: Payer: Self-pay | Admitting: Cardiology

## 2019-04-27 ENCOUNTER — Other Ambulatory Visit: Payer: Self-pay

## 2019-04-27 ENCOUNTER — Ambulatory Visit (INDEPENDENT_AMBULATORY_CARE_PROVIDER_SITE_OTHER): Payer: Medicare HMO

## 2019-04-27 DIAGNOSIS — I428 Other cardiomyopathies: Secondary | ICD-10-CM

## 2019-04-27 DIAGNOSIS — R0600 Dyspnea, unspecified: Secondary | ICD-10-CM

## 2019-04-27 NOTE — Progress Notes (Signed)
Complete echocardiogram has been performed.  Jimmy Salih Williamson RDCS, RVT 

## 2019-04-28 ENCOUNTER — Telehealth (INDEPENDENT_AMBULATORY_CARE_PROVIDER_SITE_OTHER): Payer: Medicare HMO | Admitting: Cardiology

## 2019-04-28 ENCOUNTER — Encounter: Payer: Self-pay | Admitting: Cardiology

## 2019-04-28 VITALS — BP 124/69 | HR 94 | Ht 62.5 in | Wt 200.0 lb

## 2019-04-28 DIAGNOSIS — I509 Heart failure, unspecified: Secondary | ICD-10-CM

## 2019-04-28 DIAGNOSIS — I428 Other cardiomyopathies: Secondary | ICD-10-CM | POA: Diagnosis not present

## 2019-04-28 NOTE — Progress Notes (Signed)
Virtual Visit via Telephone Note   This visit type was conducted due to national recommendations for restrictions regarding the COVID-19 Pandemic (e.g. social distancing) in an effort to limit this patient's exposure and mitigate transmission in our community.  Due to her co-morbid illnesses, this patient is at least at moderate risk for complications without adequate follow up.  This format is felt to be most appropriate for this patient at this time.  The patient did not have access to video technology/had technical difficulties with video requiring transitioning to audio format only (telephone).  All issues noted in this document were discussed and addressed.  No physical exam could be performed with this format.  Please refer to the patient's chart for her  consent to telehealth for [facility].   Date:  04/28/2019   ID:  Mindy Love, DOB 05-30-1956, MRN [id number]  Patient Location: Home Provider Location: Office  PCP:  Elisabeth Cara, MD  Cardiologist:  No primary care provider on file.  Electrophysiologist:  None   Evaluation Performed:  Follow-Up Visit  Chief Complaint: Cardiomyopathy and coronary artery disease  History of Present Illness:    Mindy Love is a 63 y.o. female with past medical history of coronary artery disease and cardiomyopathy.  She was supposed to be seen here in follow-up today.  Her echocardiogram revealed mildly depressed ejection fraction from the last evaluation.  She wanted to come in to discuss this but she had fever last night and had some cough and she has called her primary care physician and is planning to go and see her doctor.  No orthopnea or PND.  At the time of my evaluation, the patient is alert awake oriented and in no distress.  The patient does not have symptoms concerning for COVID-19 infection (fever, chills, cough, or new shortness of breath).    Past Medical History:  Diagnosis Date  . Anxiety   . CAD, multiple vessel 03/2011    Status post CABG x4 - LIMA-LAD, SVG-DIAG, SVG-OM, SVG-dRCA.([facility])  . Chronic diastolic CHF (congestive heart failure), NYHA class 2 (HCC)    With mild systolic dysfunction by last echo  . Depression   . Diabetes mellitus   . Frequency   . GERD (gastroesophageal reflux disease)   . Herniated disc   . Hyperlipidemia   . Myocardial infarction ([place]) 10/12   OV Dr Geraldo Pitter  12/22/11 with clearance note on chart , EKG  11/12 on chart  . Paroxysmal atrial fibrillation (HCC)    Documented during COPD exacerbation hospitalization in 2018   Past Surgical History:  Procedure Laterality Date  .  NASAL SURG    . APPENDECTOMY    . BACK SURGERY    . CARDIAC CATHETERIZATION  10/12  . CARDIAC STENTS  01/2011   Unknown details, but would be prior to CABG; BMS prior to recurrent Angina leading to CABG.  . CORONARY ARTERY BYPASS GRAFT  X 4 VESSELS 2012   [facility]: LIMA-LAD, SVG-DIAG, SVG-OM, SVGdRCA  . EYE SURGERY     bilateral lasik  . LUMBAR LAMINECTOMY/DECOMPRESSION MICRODISCECTOMY  06/25/2011   Procedure: LUMBAR LAMINECTOMY/DECOMPRESSION MICRODISCECTOMY;  Surgeon: Tobi Bastos;  Location: [facility];  Service: Orthopedics;  Laterality: Right;  Lumbar Hemi Laminectomy L5 - S1 on the Right/Microdiscectomy on the Right  (X-Ray  . LUMBAR LAMINECTOMY/DECOMPRESSION MICRODISCECTOMY  12/23/2011   Procedure: LUMBAR LAMINECTOMY/DECOMPRESSION MICRODISCECTOMY;  Surgeon: Tobi Bastos, MD;  Location: [facility];  Service: Orthopedics;  Laterality: Right;  Hemi Laminectomy, Microdiscectomy L5-S1 on right  . OOPHORECTOMY    . right above knee amputation    . RIGHT/LEFT HEART CATH AND CORONARY/GRAFT ANGIOGRAPHY N/A 09/16/2018   Procedure: RIGHT/LEFT HEART CATH AND CORONARY/GRAFT ANGIOGRAPHY;  Surgeon: [place], Peter M, MD;  Location: [facility] INVASIVE CV LAB;  Service: Cardiovascular;  Laterality: N/A;  . ROTATOR CUFF REPAIR  right  . TRANSTHORACIC ECHOCARDIOGRAM  12/2016   Mild LV  dilation.  Mild LVH.  Mildly decreased EF of 45 to 50% with postsurgical hypokinesis of the interventricular septum consistent with CABG.  Severe abnormal diastolic filling (grade 2/pseudonormalization).  Moderate-severe LA dilation.  Mild MR.  Mild aortic sclerosis but no stenosis.     Current Meds  Medication Sig  . aspirin EC 81 MG tablet Take 81 mg by mouth daily after breakfast. States LD 12/16/11  . atorvastatin (LIPITOR) 20 MG tablet Take 20 mg by mouth daily.  . bumetanide (BUMEX) 1 MG tablet Take 1 mg by mouth daily. Begin on 02/15/19.  Marland Kitchen clopidogrel (PLAVIX) 75 MG tablet Take 75 mg by mouth daily.  Marland Kitchen gabapentin (NEURONTIN) 600 MG tablet Take 600 mg by mouth 3 (three) times daily.  . insulin aspart (NOVOLOG) 100 UNIT/ML injection Inject 1-10 Units into the skin See admin instructions. Sliding scale per chart for each meal, 201-250- 2 units, 251-300 4 units, 301-350- 6 units, 351-400- 8 units, 401-450, 10 units and call MD.  . insulin degludec (TRESIBA FLEXTOUCH) 100 UNIT/ML SOPN FlexTouch Pen 40 Units at bedtime.  . metolazone (ZAROXOLYN) 2.5 MG tablet Take 1 tablet (2.5 mg total) by mouth daily. TAKE 1 tablet daily for 7 days starting on 03/21/19  . metoprolol succinate (TOPROL-XL) 25 MG 24 hr tablet Take 25 mg by mouth daily.  . nitroGLYCERIN (NITROSTAT) 0.4 MG SL tablet Place 0.4 mg under the tongue every 5 (five) minutes as needed. Chest pain  . Oxycodone HCl 10 MG TABS Take 10 mg by mouth 5 (five) times daily.  . pantoprazole sodium (PROTONIX) 40 mg/20 mL PACK Take by mouth.     Allergies:   Sulfasalazine, Codeine, Jardiance [empagliflozin], Sulfa antibiotics, and Tramadol   Social History   Tobacco Use  . Smoking status: Current Every Day Smoker    Packs/day: 1.00    Types: Cigarettes    Last attempt to quit: 03/23/2009    Years since quitting: 10.1  . Smokeless tobacco: Never Used  Substance Use Topics  . Alcohol use: No  . Drug use: No     Family Hx: The patient's  family history includes Hyperlipidemia in her father; Hypertension in her father.  ROS:   Please see the history of present illness.    As mentioned above All other systems reviewed and are negative.   Prior CV studies:   The following studies were reviewed today:  IMPRESSIONS    1. Left ventricular ejection fraction, by visual estimation, is 30 to 35%.There is mildly increased left ventricular hypertrophy.  2. Left ventricular diastolic parameters are consistent with Grade II diastolic dysfunction (pseudonormalization).  3. Mildly dilated left ventricular internal cavity size.  4. Global right ventricle has normal systolic function.The right ventricular size is normal. No increase in right ventricular wall thickness.  5. Left atrial size was moderately dilated.  6. Right atrial size was normal.  7. The mitral valve is normal in structure. Mild to moderate mitral valve regurgitation. No evidence of mitral stenosis.  8. The tricuspid valve is normal in structure.  9. The aortic valve  is normal in structure. Aortic valve regurgitation is trivial. Mild to moderate aortic valve sclerosis/calcification without any evidence of aortic stenosis.   Labs/Other Tests and Data Reviewed:    EKG:  No ECG reviewed.  Recent Labs: 09/17/2018: ALT 17 09/19/2018: Hemoglobin 12.8; Magnesium 2.2; Platelets 419 03/21/2019: BUN 18; Creatinine, Ser 1.04; Potassium 4.9; Sodium 136   Recent Lipid Panel Lab Results  Component Value Date/Time   CHOL 124 09/17/2018 02:48 AM   TRIG 62 09/17/2018 02:48 AM   HDL 32 (L) 09/17/2018 02:48 AM   CHOLHDL 3.9 09/17/2018 02:48 AM   LDLCALC 80 09/17/2018 02:48 AM    Wt Readings from Last 3 Encounters:  04/28/19 200 lb (90.7 kg)  01/31/19 205 lb (93 kg)  12/21/18 195 lb (88.5 kg)     Objective:    Vital Signs:  BP 124/69 (BP Location: Left Arm, Patient Position: Sitting, Cuff Size: Normal)   Pulse 94   Ht 5' 2.5" (1.588 m)   Wt 200 lb (90.7 kg)   BMI  36.00 kg/m    VITAL SIGNS:  reviewed  ASSESSMENT & PLAN:    1. Coronary artery disease and cardiomyopathy: Patient seems to be tolerating medications well.  Congestive heart failure education was given to the patient and she vocalized understanding.  Echocardiogram report was discussed with her at extensive length and questions were answered to her satisfaction.  As she is having fever and cough I told her to hold off on her appointment for the next 2 to 3 weeks and see her primary care doctor and get this result and will discuss these issues when she sees [place] in follow-up appointment in 2 to 3 weeks.  COVID-19 Education: The signs and symptoms of COVID-19 were discussed with the patient and how to seek care for testing (follow up with PCP or arrange E-visit).  The importance of social distancing was discussed today.  Time:   Today, I have spent 15 minutes with the patient with telehealth technology discussing the above problems.     Medication Adjustments/Labs and Tests Ordered: Current medicines are reviewed at length with the patient today.  Concerns regarding medicines are outlined above.   Tests Ordered: No orders of the defined types were placed in this encounter.   Medication Changes: No orders of the defined types were placed in this encounter.   Follow Up:  In Person 2 to 3 weeks  Signed, Garwin Brothers, MD  04/28/2019 12:10 PM    [facility]

## 2019-04-28 NOTE — Patient Instructions (Signed)
Medication Instructions:  Your physician recommends that you continue on your current medications as directed. Please refer to the Current Medication list given to you today.  *If you need a refill on your cardiac medications before your next appointment, please call your pharmacy*  Lab Work: NONE If you have labs (blood work) drawn today and your tests are completely normal, you will receive your results only by: Marland Kitchen MyChart Message (if you have MyChart) OR . A paper copy in the mail If you have any lab test that is abnormal or we need to change your treatment, we will call you to review the results.  Testing/Procedures: NONE  Follow-Up: At [facility], you and your health needs are our priority.  As part of our continuing mission to provide you with exceptional heart care, we have created designated Provider Care Teams.  These Care Teams include your primary Cardiologist (physician) and Advanced Practice Providers (APPs -  Physician Assistants and Nurse Practitioners) who all work together to provide you with the care you need, when you need it.  Your next appointment:   3 weeks  The format for your next appointment:   In Person  Provider:   Jyl Heinz, MD

## 2019-05-03 ENCOUNTER — Telehealth: Payer: Self-pay

## 2019-05-03 NOTE — Telephone Encounter (Signed)
Unable to leave a message will attempt to contact later. Copy sent to Dr. Gloriann Loan

## 2019-05-03 NOTE — Telephone Encounter (Signed)
-----   Message from Jenean Lindau, MD sent at 04/27/2019  1:55 PM EST ----- Abnormal and moderately depressed ejection fraction.  I think she has an appointment coming up with me in the next few days and I will discuss this with her at length. Jenean Lindau, MD 04/27/2019 1:49 PM

## 2019-05-05 NOTE — Telephone Encounter (Signed)
2nd attempt to call patient

## 2019-05-18 ENCOUNTER — Other Ambulatory Visit: Payer: Self-pay

## 2019-05-18 ENCOUNTER — Ambulatory Visit (INDEPENDENT_AMBULATORY_CARE_PROVIDER_SITE_OTHER): Payer: Medicare HMO | Admitting: Cardiology

## 2019-05-18 ENCOUNTER — Encounter: Payer: Self-pay | Admitting: Cardiology

## 2019-05-18 VITALS — BP 128/60 | HR 67 | Ht 62.5 in

## 2019-05-18 DIAGNOSIS — E782 Mixed hyperlipidemia: Secondary | ICD-10-CM

## 2019-05-18 DIAGNOSIS — L03119 Cellulitis of unspecified part of limb: Secondary | ICD-10-CM

## 2019-05-18 DIAGNOSIS — E1159 Type 2 diabetes mellitus with other circulatory complications: Secondary | ICD-10-CM | POA: Diagnosis not present

## 2019-05-18 DIAGNOSIS — Z1329 Encounter for screening for other suspected endocrine disorder: Secondary | ICD-10-CM | POA: Diagnosis not present

## 2019-05-18 DIAGNOSIS — Z89619 Acquired absence of unspecified leg above knee: Secondary | ICD-10-CM | POA: Diagnosis not present

## 2019-05-18 DIAGNOSIS — I2510 Atherosclerotic heart disease of native coronary artery without angina pectoris: Secondary | ICD-10-CM | POA: Diagnosis not present

## 2019-05-18 MED ORDER — AMOXICILLIN-POT CLAVULANATE 875-125 MG PO TABS: 1.0000 | ORAL_TABLET | Freq: Two times a day (BID) | ORAL | 0 refills | Status: AC

## 2019-05-18 NOTE — Patient Instructions (Signed)
Medication Instructions:  Your physician has recommended you make the following change in your medication: START taking augmentin 875-125mg  (1 tablet) twice daily for 10 days  *If you need a refill on your cardiac medications before your next appointment, please call your pharmacy*  Lab Work: Your physician recommends that you have a BMP, CBC, TSH drawn today  If you have labs (blood work) drawn today and your tests are completely normal, you will receive your results only by: Marland Kitchen MyChart Message (if you have MyChart) OR . A paper copy in the mail If you have any lab test that is abnormal or we need to change your treatment, we will call you to review the results.  Testing/Procedures: NONE  Follow-Up: At [facility], you and your health needs are our priority.  As part of our continuing mission to provide you with exceptional heart care, we have created designated Provider Care Teams.  These Care Teams include your primary Cardiologist (physician) and Advanced Practice Providers (APPs -  Physician Assistants and Nurse Practitioners) who all work together to provide you with the care you need, when you need it.  Your next appointment:   1 month(s)  The format for your next appointment:   In Person  Provider:   Jyl Heinz, MD  Other Instructions Amoxicillin; Clavulanic Acid tablets What is this medicine? AMOXICILLIN; CLAVULANIC ACID (a mox i SIL in; KLAV yoo lan ic AS id) is a penicillin antibiotic. It is used to treat certain kinds of bacterial infections. It will not work for colds, flu, or other viral infections. This medicine may be used for other purposes; ask your health care provider or pharmacist if you have questions. COMMON BRAND NAME(S): Augmentin What should I tell my health care provider before I take this medicine? They need to know if you have any of these conditions:  bowel disease, like colitis  kidney disease  liver disease  mononucleosis  an unusual  or allergic reaction to amoxicillin, penicillin, cephalosporin, other antibiotics, clavulanic acid, other medicines, foods, dyes, or preservatives  pregnant or trying to get pregnant  breast-feeding How should I use this medicine? Take this medicine by mouth with a full glass of water. Follow the directions on the prescription label. Take at the start of a meal. Do not crush or chew. If the tablet has a score line, you may cut it in half at the score line for easier swallowing. Take your medicine at regular intervals. Do not take your medicine more often than directed. Take all of your medicine as directed even if you think you are better. Do not skip doses or stop your medicine early. Talk to your pediatrician regarding the use of this medicine in children. Special care may be needed. Overdosage: If you think you have taken too much of this medicine contact a poison control center or emergency room at once. NOTE: This medicine is only for you. Do not share this medicine with others. What if I miss a dose? If you miss a dose, take it as soon as you can. If it is almost time for your next dose, take only that dose. Do not take double or extra doses. What may interact with this medicine?  allopurinol  anticoagulants  birth control pills  methotrexate  probenecid This list may not describe all possible interactions. Give your health care provider a list of all the medicines, herbs, non-prescription drugs, or dietary supplements you use. Also tell them if you smoke, drink alcohol, or use  illegal drugs. Some items may interact with your medicine. What should I watch for while using this medicine? Tell your doctor or healthcare provider if your symptoms do not improve. This medicine may cause serious skin reactions. They can happen weeks to months after starting the medicine. Contact your healthcare provider right away if you notice fevers or flu-like symptoms with a rash. The rash may be red or  purple and then turn into blisters or peeling of the skin. Or, you might notice a red rash with swelling of the face, lips or lymph nodes in your neck or under your arms. Do not treat diarrhea with over the counter products. Contact your doctor if you have diarrhea that lasts more than 2 days or if it is severe and watery. If you have diabetes, you may get a false-positive result for sugar in your urine. Check with your doctor or healthcare provider. Birth control pills may not work properly while you are taking this medicine. Talk to your doctor about using an extra method of birth control. What side effects may I notice from receiving this medicine? Side effects that you should report to your doctor or health care professional as soon as possible:  allergic reactions like skin rash, itching or hives, swelling of the face, lips, or tongue  breathing problems  dark urine  fever or chills, sore throat  redness, blistering, peeling, or loosening of the skin, including inside the mouth  seizures  trouble passing urine or change in the amount of urine  unusual bleeding, bruising  unusually weak or tired  white patches or sores in the mouth or throat Side effects that usually do not require medical attention (report to your doctor or health care professional if they continue or are bothersome):  diarrhea  dizziness  headache  nausea, vomiting  stomach upset  vaginal or anal irritation This list may not describe all possible side effects. Call your doctor for medical advice about side effects. You may report side effects to FDA at 1-800-FDA-1088. Where should I keep my medicine? Keep out of the reach of children. Store at room temperature below 25 degrees C (77 degrees F). Keep container tightly closed. Throw away any unused medicine after the expiration date. NOTE: This sheet is a summary. It may not cover all possible information. If you have questions about this medicine, talk  to your doctor, pharmacist, or health care provider.  2020 Elsevier/Gold Standard (2018-08-15 09:43:46)

## 2019-05-18 NOTE — Progress Notes (Signed)
Cardiology Office Note:    Date:  05/18/2019   ID:  Mindy Love, DOB August 03, 1955, MRN [id number]  PCP:  Kendell Bane, MD  Cardiologist:  Garwin Brothers, MD   Referring MD: Kendell Bane, MD    ASSESSMENT:    1. Atherosclerosis of native coronary artery of native heart without angina pectoris   2. Mixed hyperlipidemia   3. Thyroid disorder screen   4. Type 2 diabetes mellitus with vascular disease (HCC)   5. Above-knee amputee, unspecified laterality (HCC)   6. Cellulitis of foot    PLAN:    In order of problems listed above:  1. Cellulitis of the lower extremity: I discussed my findings with the patient at extensive length.  I am a little concerned about these findings especially in view of the fact that she is a diabetic.  It also happens that 2 days of Friday.  In view of this we will do blood work today including CBC.  I will initiate her on Augmentin 875 mg twice daily for 10 days.  I told the to see her primary care physician early next week Monday or Tuesday to get a follow-up assessment.  She knows to go to the nearest emergency room if her foot gets worse.  At this time there is increased warmth and redness. 2. Cardiomyopathy: Ischemic in nature.  Again patient is not very compliant with medical advice.  I discussed this with her at extensive length.  I also counseled her about quitting smoking.  I will review these issues in a month when I see her in follow-up.  Her blood pressure is stable.  She needs to be noncompliant with her medical care for [place] to make improvement in her issues of morbidity and mortality.  I discussed this with her at length and she promises to do better.   Medication Adjustments/Labs and Tests Ordered: Current medicines are reviewed at length with the patient today.  Concerns regarding medicines are outlined above.  Orders Placed This Encounter  Procedures   Basic Metabolic Panel (BMET)   CBC   TSH   Meds ordered this encounter  Medications    amoxicillin-clavulanate (AUGMENTIN) 875-125 MG tablet    Sig: Take 1 tablet by mouth 2 (two) times daily.    Dispense:  20 tablet    Refill:  0     Chief Complaint  Patient presents with   Follow-up     History of Present Illness:    Mindy Love is a 63 y.o. female.  Patient has past medical history of coronary artery disease post CABG surgery, essential hypertension, dyslipidemia and diabetes mellitus.  Unfortunately she does not take the best care of herself.  She mentions to me that she had some leg swelling and therefore she has increased her diuretic.  No chest pain orthopnea or PND.  Past Medical History:  Diagnosis Date   Anxiety    CAD, multiple vessel 03/2011   Status post CABG x4 - LIMA-LAD, SVG-DIAG, SVG-OM, SVG-dRCA.([facility])   Chronic diastolic CHF (congestive heart failure), NYHA class 2 (HCC)    With mild systolic dysfunction by last echo   Depression    Diabetes mellitus    Frequency    GERD (gastroesophageal reflux disease)    Herniated disc    Hyperlipidemia    Myocardial infarction (HCC) 10/12   OV Dr Tomie China  12/22/11 with clearance note on chart , EKG  11/12 on chart   Paroxysmal atrial fibrillation (HCC)  Documented during COPD exacerbation hospitalization in 2018    Past Surgical History:  Procedure Laterality Date    NASAL SURG     APPENDECTOMY     BACK SURGERY     CARDIAC CATHETERIZATION  10/12   CARDIAC STENTS  01/2011   Unknown details, but would be prior to CABG; BMS prior to recurrent Angina leading to CABG.   CORONARY ARTERY BYPASS GRAFT  X 4 VESSELS 2012   [facility]: LIMA-LAD, SVG[facility]DIAG[facility]   EYE SURGERY     bilateral lasik   LUMBAR LAMINECTOMY/DECOMPRESSION MICRODISCECTOMY  06/25/2011   Procedure: LUMBAR LAMINECTOMY/DECOMPRESSION MICRODISCECTOMY;  Surgeon: Jacki Conesonald A Gioffre;  Location: [facility];  Service: Orthopedics;  Laterality: Right;  Lumbar Hemi Laminectomy L5 - S1  on the Right/Microdiscectomy on the Right  (X-Ray   LUMBAR LAMINECTOMY/DECOMPRESSION MICRODISCECTOMY  12/23/2011   Procedure: LUMBAR LAMINECTOMY/DECOMPRESSION MICRODISCECTOMY;  Surgeon: Jacki Conesonald A Gioffre, MD;  Location: [facility];  Service: Orthopedics;  Laterality: Right;  Hemi Laminectomy, Microdiscectomy L5-S1 on right   OOPHORECTOMY     right above knee amputation     RIGHT/LEFT HEART CATH AND CORONARY/GRAFT ANGIOGRAPHY N/A 09/16/2018   Procedure: RIGHT/LEFT HEART CATH AND CORONARY/GRAFT ANGIOGRAPHY;  Surgeon: [place], Peter M, MD;  [facility];  Service: Cardiovascular;  Laterality: N/A;   ROTATOR CUFF REPAIR  right   TRANSTHORACIC ECHOCARDIOGRAM  12/2016   Mild LV dilation.  Mild LVH.  Mildly decreased EF of 45 to 50% with postsurgical hypokinesis of the interventricular septum consistent with CABG.  Severe abnormal diastolic filling (grade 2/pseudonormalization).  Moderate-severe LA dilation.  Mild MR.  Mild aortic sclerosis but no stenosis.    Current Medications: Current Meds  Medication Sig   aspirin EC 81 MG tablet Take 81 mg by mouth daily after breakfast. States LD 12/16/11   atorvastatin (LIPITOR) 20 MG tablet Take 20 mg by mouth daily.   bumetanide (BUMEX) 1 MG tablet Take 1 mg by mouth daily. Begin on 02/15/19.   clopidogrel (PLAVIX) 75 MG tablet Take 75 mg by mouth daily.   DULoxetine (CYMBALTA) 60 MG capsule Take 120 mg by mouth daily.    furosemide (LASIX) 40 MG tablet Take 40 mg by mouth every morning.   gabapentin (NEURONTIN) 600 MG tablet Take 600 mg by mouth 3 (three) times daily.   insulin aspart (NOVOLOG) 100 UNIT/ML injection Inject 1-10 Units into the skin See admin instructions. Sliding scale per chart for each meal, 201-250- 2 units, 251-300 4 units, 301-350- 6 units, 351-400- 8 units, 401-450, 10 units and call MD.   insulin degludec (TRESIBA FLEXTOUCH) 100 UNIT/ML SOPN FlexTouch Pen 40 Units at bedtime.   metoprolol succinate (TOPROL-XL) 25  MG 24 hr tablet Take 25 mg by mouth daily.   nitroGLYCERIN (NITROSTAT) 0.4 MG SL tablet Place 0.4 mg under the tongue every 5 (five) minutes as needed. Chest pain   Oxycodone HCl 10 MG TABS Take 10 mg by mouth 5 (five) times daily.   pantoprazole (PROTONIX) 40 MG tablet Take 40 mg by mouth 2 (two) times daily.   sacubitril-valsartan (ENTRESTO) 24-26 MG Take 1 tablet by mouth 2 (two) times daily.   [DISCONTINUED] pantoprazole sodium (PROTONIX) 40 mg/20 mL PACK Take by mouth.     Allergies:   Sulfasalazine, Codeine, Jardiance [empagliflozin], Sulfa antibiotics, and Tramadol   Social History   Socioeconomic History   Marital status: Married    Spouse name: Not on file   Number of children: Not on file  Years of education: Not on file   Highest education level: Not on file  Occupational History   Not on file  Social Needs   Financial resource strain: Not on file   Food insecurity    Worry: Not on file    Inability: Not on file   Transportation needs    Medical: Not on file    Non-medical: Not on file  Tobacco Use   Smoking status: Current Every Day Smoker    Packs/day: 1.00    Types: Cigarettes    Last attempt to quit: 03/23/2009    Years since quitting: 10.1   Smokeless tobacco: Never Used  Substance and Sexual Activity   Alcohol use: No   Drug use: No   Sexual activity: Not on file  Lifestyle   Physical activity    Days per week: 0 days    Minutes per session: 0 min   Stress: Only a little  Relationships   Event organiser on phone: Not on file    Gets together: Not on file    Attends religious service: Not on file    Active member of club or organization: Not on file    Attends meetings of clubs or organizations: Not on file    Relationship status: Not on file  Other Topics Concern   Not on file  Social History Narrative   Not on file     Family History: The patient's family history includes Hyperlipidemia in her father;  Hypertension in her father.  ROS:   Please see the history of present illness.    All other systems reviewed and are negative.  EKGs/Labs/Other Studies Reviewed:    The following studies were reviewed today: Study date: 09/16/18  Physicians  Panel Physicians Referring Physician Case Authorizing Physician  [place], Peter M, MD (Primary)    Procedures  RIGHT/LEFT HEART CATH AND CORONARY/GRAFT ANGIOGRAPHY  Conclusion    Prox LAD lesion is 100% stenosed.  Ost 1st Mrg lesion is 100% stenosed.  Prox RCA to Mid RCA lesion is 100% stenosed.  LIMA graft was visualized by angiography and is normal in caliber.  The graft exhibits no disease.  Seq SVG- second diagonal and OM1 graft was visualized by angiography and is normal in caliber.  The graft exhibits no disease.  SVG graft was visualized by angiography and is normal in caliber.  The graft exhibits no disease.  There is mild to moderate left ventricular systolic dysfunction.  The left ventricular ejection fraction is 35-45% by visual estimate.  LV end diastolic pressure is mildly elevated.   1. 3 vessel occlusive CAD.     -100% LAD after a tiny first diagonal    - 100% OM 1    - 100% proximal to mid RCA at site of prior stent 2. Patent LIMA to the LAD 3. Patent sequential SVG to the second diagonal and OM 1 4. Patent SVG to the distal RCA 5. Moderate LV dysfunction. EF estimated at 40-45% 6. Mildly elevated LV filling pressures 7. Normal right heart pressures.  8. Good cardiac output.  Plan: continue medical therapy. Plan to transition to oral lasix tomorrow.       1. Left ventricular ejection fraction, by visual estimation, is 30 to 35%.There is mildly increased left ventricular hypertrophy.  2. Left ventricular diastolic parameters are consistent with Grade II diastolic dysfunction (pseudonormalization).  3. Mildly dilated left ventricular internal cavity size.  4. Global right ventricle has normal systolic  function.The  right ventricular size is normal. No increase in right ventricular wall thickness.  5. Left atrial size was moderately dilated.  6. Right atrial size was normal.  7. The mitral valve is normal in structure. Mild to moderate mitral valve regurgitation. No evidence of mitral stenosis.  8. The tricuspid valve is normal in structure.  9. The aortic valve is normal in structure. Aortic valve regurgitation is trivial. Mild to moderate aortic valve sclerosis/calcification without any evidence of aortic stenosis.   Recent Labs: 09/17/2018: ALT 17 09/19/2018: Hemoglobin 12.8; Magnesium 2.2; Platelets 419 03/21/2019: BUN 18; Creatinine, Ser 1.04; Potassium 4.9; Sodium 136  Recent Lipid Panel    Component Value Date/Time   CHOL 124 09/17/2018 0248   TRIG 62 09/17/2018 0248   HDL 32 (L) 09/17/2018 0248   CHOLHDL 3.9 09/17/2018 0248   VLDL 12 09/17/2018 0248   LDLCALC 80 09/17/2018 0248    Physical Exam:    VS:  BP 128/60 (BP Location: Left Arm, Patient Position: Sitting, Cuff Size: Normal)    Pulse 67    Ht 5' 2.5" (1.588 m)    SpO2 93%    BMI 36.00 kg/m     Wt Readings from Last 3 Encounters:  04/28/19 200 lb (90.7 kg)  01/31/19 205 lb (93 kg)  12/21/18 195 lb (88.5 kg)     GEN: Patient is in no acute distress HEENT: Normal NECK: No JVD; No carotid bruits LYMPHATICS: No lymphadenopathy CARDIAC: Hear sounds regular, 2/6 systolic murmur at the apex. RESPIRATORY:  Clear to auscultation without rales, wheezing or rhonchi  ABDOMEN: Soft, non-tender, non-distended MUSCULOSKELETAL: She has an amputated leg.  The other leg has redness and increased warmth and is suggestive of cellulitis. SKIN: Warm and dry NEUROLOGIC:  Alert and oriented x 3 PSYCHIATRIC:  Normal affect   Signed, Jenean Lindau, MD  05/18/2019 12:04 PM    [facility]

## 2019-05-19 LAB — BASIC METABOLIC PANEL
BUN/Creatinine Ratio: 16 (ref 12–28)
BUN: 17 mg/dL (ref 8–27)
CO2: 27 mmol/L (ref 20–29)
Calcium: 9.1 mg/dL (ref 8.7–10.3)
Chloride: 96 mmol/L (ref 96–106)
Creatinine, Ser: 1.04 mg/dL — ABNORMAL HIGH (ref 0.57–1.00)
GFR calc Af Amer: 67 mL/min/{1.73_m2} (ref >59)
GFR calc non Af Amer: 58 mL/min/{1.73_m2} — ABNORMAL LOW (ref >59)
Glucose: 161 mg/dL — ABNORMAL HIGH (ref 65–99)
Potassium: 4.8 mmol/L (ref 3.5–5.2)
Sodium: 140 mmol/L (ref 134–144)

## 2019-05-19 LAB — CBC
Hematocrit: 36.4 % (ref 34.0–46.6)
Hemoglobin: 11.1 g/dL (ref 11.1–15.9)
MCH: 25.8 pg — ABNORMAL LOW (ref 26.6–33.0)
MCHC: 30.5 g/dL — ABNORMAL LOW (ref 31.5–35.7)
MCV: 85 fL (ref 79–97)
Platelets: 434 10*3/uL (ref 150–450)
RBC: 4.3 x10E6/uL (ref 3.77–5.28)
RDW: 16.3 % — ABNORMAL HIGH (ref 11.7–15.4)
WBC: 11.4 10*3/uL — ABNORMAL HIGH (ref 3.4–10.8)

## 2019-05-19 LAB — TSH: TSH: 3.29 u[IU]/mL (ref 0.450–4.500)

## 2019-05-23 DIAGNOSIS — J441 Chronic obstructive pulmonary disease with (acute) exacerbation: Secondary | ICD-10-CM | POA: Diagnosis not present

## 2019-05-24 ENCOUNTER — Telehealth: Payer: Self-pay

## 2019-05-24 NOTE — Telephone Encounter (Signed)
-----   Message from Jenean Lindau, MD sent at 05/19/2019 11:56 AM EST ----- Inform patient and send copy to primary care.  The results of the study is unremarkable. Please inform patient. I will discuss in detail at next appointment. Cc  primary care/referring physician Jenean Lindau, MD 05/19/2019 11:56 AM

## 2019-05-24 NOTE — Telephone Encounter (Signed)
Left message for patient to call office for lab results, copy sent to Dr. Gloriann Loan.

## 2019-06-05 ENCOUNTER — Telehealth: Payer: Self-pay

## 2019-06-05 NOTE — Telephone Encounter (Signed)
-----   Message from Rajan R Revankar, MD sent at 05/19/2019 11:56 AM EST ----- Inform patient and send copy to primary care.  The results of the study is unremarkable. Please inform patient. I will discuss in detail at next appointment. Cc  primary care/referring physician Rajan R Revankar, MD 05/19/2019 11:56 AM 

## 2019-06-05 NOTE — Telephone Encounter (Signed)
Unable to leave a vm, will continue efforts. Copy sent to Dr. Gloriann Loan

## 2019-06-13 NOTE — Telephone Encounter (Signed)
Results relayed, no further questions. 

## 2019-06-19 DIAGNOSIS — I10 Essential (primary) hypertension: Secondary | ICD-10-CM | POA: Diagnosis not present

## 2019-06-19 DIAGNOSIS — R1084 Generalized abdominal pain: Secondary | ICD-10-CM | POA: Diagnosis not present

## 2019-06-19 DIAGNOSIS — R0902 Hypoxemia: Secondary | ICD-10-CM | POA: Diagnosis not present

## 2019-06-19 DIAGNOSIS — R52 Pain, unspecified: Secondary | ICD-10-CM | POA: Diagnosis not present

## 2019-06-19 DIAGNOSIS — R109 Unspecified abdominal pain: Secondary | ICD-10-CM | POA: Diagnosis not present

## 2019-06-19 DIAGNOSIS — R140 Abdominal distension (gaseous): Secondary | ICD-10-CM | POA: Diagnosis not present

## 2019-06-22 ENCOUNTER — Ambulatory Visit: Payer: Medicare HMO | Admitting: Cardiology

## 2019-06-28 DIAGNOSIS — R109 Unspecified abdominal pain: Secondary | ICD-10-CM | POA: Diagnosis not present

## 2019-06-28 DIAGNOSIS — I517 Cardiomegaly: Secondary | ICD-10-CM | POA: Diagnosis not present

## 2019-06-28 DIAGNOSIS — J9811 Atelectasis: Secondary | ICD-10-CM | POA: Diagnosis not present

## 2019-06-28 DIAGNOSIS — R918 Other nonspecific abnormal finding of lung field: Secondary | ICD-10-CM | POA: Diagnosis not present

## 2019-06-28 DIAGNOSIS — Z03818 Encounter for observation for suspected exposure to other biological agents ruled out: Secondary | ICD-10-CM | POA: Diagnosis not present

## 2019-06-28 DIAGNOSIS — Z20822 Contact with and (suspected) exposure to covid-19: Secondary | ICD-10-CM | POA: Diagnosis not present

## 2019-06-28 DIAGNOSIS — N390 Urinary tract infection, site not specified: Secondary | ICD-10-CM | POA: Diagnosis not present

## 2019-06-28 DIAGNOSIS — I5022 Chronic systolic (congestive) heart failure: Secondary | ICD-10-CM | POA: Diagnosis not present

## 2019-06-28 DIAGNOSIS — Z89611 Acquired absence of right leg above knee: Secondary | ICD-10-CM | POA: Diagnosis not present

## 2019-06-28 DIAGNOSIS — I130 Hypertensive heart and chronic kidney disease with heart failure and stage 1 through stage 4 chronic kidney disease, or unspecified chronic kidney disease: Secondary | ICD-10-CM | POA: Diagnosis not present

## 2019-06-28 DIAGNOSIS — J9601 Acute respiratory failure with hypoxia: Secondary | ICD-10-CM | POA: Diagnosis not present

## 2019-06-28 DIAGNOSIS — D509 Iron deficiency anemia, unspecified: Secondary | ICD-10-CM | POA: Diagnosis not present

## 2019-06-28 DIAGNOSIS — J441 Chronic obstructive pulmonary disease with (acute) exacerbation: Secondary | ICD-10-CM | POA: Diagnosis not present

## 2019-06-28 DIAGNOSIS — B9620 Unspecified Escherichia coli [E. coli] as the cause of diseases classified elsewhere: Secondary | ICD-10-CM | POA: Diagnosis not present

## 2019-06-28 DIAGNOSIS — K760 Fatty (change of) liver, not elsewhere classified: Secondary | ICD-10-CM | POA: Diagnosis not present

## 2019-06-28 DIAGNOSIS — R69 Illness, unspecified: Secondary | ICD-10-CM | POA: Diagnosis not present

## 2019-06-28 DIAGNOSIS — E559 Vitamin D deficiency, unspecified: Secondary | ICD-10-CM | POA: Diagnosis not present

## 2019-06-28 DIAGNOSIS — E1165 Type 2 diabetes mellitus with hyperglycemia: Secondary | ICD-10-CM | POA: Diagnosis not present

## 2019-06-28 DIAGNOSIS — R7989 Other specified abnormal findings of blood chemistry: Secondary | ICD-10-CM | POA: Diagnosis not present

## 2019-06-28 DIAGNOSIS — I2510 Atherosclerotic heart disease of native coronary artery without angina pectoris: Secondary | ICD-10-CM | POA: Diagnosis not present

## 2019-06-28 DIAGNOSIS — E1151 Type 2 diabetes mellitus with diabetic peripheral angiopathy without gangrene: Secondary | ICD-10-CM | POA: Diagnosis not present

## 2019-06-28 DIAGNOSIS — R0602 Shortness of breath: Secondary | ICD-10-CM | POA: Diagnosis not present

## 2019-06-28 DIAGNOSIS — I509 Heart failure, unspecified: Secondary | ICD-10-CM | POA: Diagnosis not present

## 2019-06-28 DIAGNOSIS — I5043 Acute on chronic combined systolic (congestive) and diastolic (congestive) heart failure: Secondary | ICD-10-CM | POA: Diagnosis not present

## 2019-06-28 DIAGNOSIS — Z951 Presence of aortocoronary bypass graft: Secondary | ICD-10-CM | POA: Diagnosis not present

## 2019-06-28 DIAGNOSIS — R52 Pain, unspecified: Secondary | ICD-10-CM | POA: Diagnosis not present

## 2019-06-28 DIAGNOSIS — R0689 Other abnormalities of breathing: Secondary | ICD-10-CM | POA: Diagnosis not present

## 2019-06-28 DIAGNOSIS — R0902 Hypoxemia: Secondary | ICD-10-CM | POA: Diagnosis not present

## 2019-06-28 DIAGNOSIS — J189 Pneumonia, unspecified organism: Secondary | ICD-10-CM | POA: Diagnosis not present

## 2019-07-07 MED ORDER — METHYLPREDNISOLONE SODIUM SUCC 40 MG IJ SOLR
40.00 | INTRAMUSCULAR | Status: DC
Start: 2019-07-07 — End: 2019-07-07

## 2019-07-07 MED ORDER — ASPIRIN 81 MG PO TBEC
81.00 | DELAYED_RELEASE_TABLET | ORAL | Status: DC
Start: 2019-07-07 — End: 2019-07-07

## 2019-07-07 MED ORDER — CLOPIDOGREL BISULFATE 75 MG PO TABS
75.00 | ORAL_TABLET | ORAL | Status: DC
Start: 2019-07-07 — End: 2019-07-07

## 2019-07-07 MED ORDER — OXYCODONE HCL 5 MG PO TABS
10.00 | ORAL_TABLET | ORAL | Status: DC
Start: ? — End: 2019-07-07

## 2019-07-07 MED ORDER — DORNASE ALFA 2.5 MG/2.5ML IN SOLN
2.50 | RESPIRATORY_TRACT | Status: DC
Start: 2019-07-06 — End: 2019-07-07

## 2019-07-07 MED ORDER — GLUCAGON (RDNA) 1 MG IJ KIT
1.00 | PACK | INTRAMUSCULAR | Status: DC
Start: ? — End: 2019-07-07

## 2019-07-07 MED ORDER — SACCHAROMYCES BOULARDII 250 MG PO CAPS
250.00 | ORAL_CAPSULE | ORAL | Status: DC
Start: 2019-07-06 — End: 2019-07-07

## 2019-07-07 MED ORDER — SENNOSIDES 8.6 MG PO TABS
1.00 | ORAL_TABLET | ORAL | Status: DC
Start: ? — End: 2019-07-07

## 2019-07-07 MED ORDER — ACETAMINOPHEN 325 MG PO TABS
650.00 | ORAL_TABLET | ORAL | Status: DC
Start: ? — End: 2019-07-07

## 2019-07-07 MED ORDER — INSULIN LISPRO 100 UNIT/ML ~~LOC~~ SOLN
0.00 | SUBCUTANEOUS | Status: DC
Start: 2019-07-06 — End: 2019-07-07

## 2019-07-07 MED ORDER — ONDANSETRON HCL 4 MG/2ML IJ SOLN
4.00 | INTRAMUSCULAR | Status: DC
Start: ? — End: 2019-07-07

## 2019-07-07 MED ORDER — IPRATROPIUM-ALBUTEROL 0.5-2.5 (3) MG/3ML IN SOLN
3.00 | RESPIRATORY_TRACT | Status: DC
Start: ? — End: 2019-07-07

## 2019-07-07 MED ORDER — DULOXETINE HCL 60 MG PO CPEP
120.00 | ORAL_CAPSULE | ORAL | Status: DC
Start: 2019-07-07 — End: 2019-07-07

## 2019-07-07 MED ORDER — PANTOPRAZOLE SODIUM 40 MG PO TBEC
40.00 | DELAYED_RELEASE_TABLET | ORAL | Status: DC
Start: 2019-07-06 — End: 2019-07-07

## 2019-07-07 MED ORDER — POLYVINYL ALCOHOL 1.4 % OP SOLN
1.00 | OPHTHALMIC | Status: DC
Start: ? — End: 2019-07-07

## 2019-07-07 MED ORDER — IPRATROPIUM-ALBUTEROL 0.5-2.5 (3) MG/3ML IN SOLN
3.00 | RESPIRATORY_TRACT | Status: DC
Start: 2019-07-06 — End: 2019-07-07

## 2019-07-07 MED ORDER — INSULIN GLARGINE 100 UNIT/ML ~~LOC~~ SOLN
50.00 | SUBCUTANEOUS | Status: DC
Start: 2019-07-06 — End: 2019-07-07

## 2019-07-07 MED ORDER — MELATONIN 3 MG PO TABS
3.00 | ORAL_TABLET | ORAL | Status: DC
Start: ? — End: 2019-07-07

## 2019-07-07 MED ORDER — ENOXAPARIN SODIUM 30 MG/0.3ML ~~LOC~~ SOLN
30.00 | SUBCUTANEOUS | Status: DC
Start: 2019-07-07 — End: 2019-07-07

## 2019-07-07 MED ORDER — GUAIFENESIN ER 600 MG PO TB12
1200.00 | ORAL_TABLET | ORAL | Status: DC
Start: 2019-07-06 — End: 2019-07-07

## 2019-07-07 MED ORDER — BENZONATATE 100 MG PO CAPS
100.00 | ORAL_CAPSULE | ORAL | Status: DC
Start: ? — End: 2019-07-07

## 2019-07-07 MED ORDER — GABAPENTIN 600 MG PO TABS
300.00 | ORAL_TABLET | ORAL | Status: DC
Start: 2019-07-06 — End: 2019-07-07

## 2019-07-07 MED ORDER — NICOTINE 14 MG/24HR TD PT24
1.00 | MEDICATED_PATCH | TRANSDERMAL | Status: DC
Start: ? — End: 2019-07-07

## 2019-07-07 MED ORDER — POLYETHYLENE GLYCOL 3350 17 GM/SCOOP PO POWD
17.00 | ORAL | Status: DC
Start: ? — End: 2019-07-07

## 2019-07-07 MED ORDER — DEXTROSE 50 % IV SOLN
50.00 | INTRAVENOUS | Status: DC
Start: ? — End: 2019-07-07

## 2019-07-07 MED ORDER — METOPROLOL SUCCINATE ER 25 MG PO TB24
25.00 | ORAL_TABLET | ORAL | Status: DC
Start: 2019-07-07 — End: 2019-07-07

## 2019-07-07 MED ORDER — MORPHINE SULFATE 2 MG/ML IJ SOLN
0.50 | INTRAMUSCULAR | Status: DC
Start: ? — End: 2019-07-07

## 2019-07-07 MED ORDER — ATORVASTATIN CALCIUM 20 MG PO TABS
20.00 | ORAL_TABLET | ORAL | Status: DC
Start: 2019-07-07 — End: 2019-07-07

## 2019-07-07 MED ORDER — GLUCOSE 40 % PO GEL
ORAL | Status: DC
Start: ? — End: 2019-07-07

## 2019-07-07 MED ORDER — GENERIC EXTERNAL MEDICATION
1.00 | Status: DC
Start: 2019-07-06 — End: 2019-07-07

## 2019-07-07 MED ORDER — SACUBITRIL-VALSARTAN 24-26 MG PO TABS
1.00 | ORAL_TABLET | ORAL | Status: DC
Start: 2019-07-06 — End: 2019-07-07

## 2019-07-07 MED ORDER — ALBUTEROL SULFATE (2.5 MG/3ML) 0.083% IN NEBU
2.50 | INHALATION_SOLUTION | RESPIRATORY_TRACT | Status: DC
Start: ? — End: 2019-07-07

## 2019-07-07 MED ORDER — ERGOCALCIFEROL 1.25 MG (50000 UT) PO CAPS
50000.00 | ORAL_CAPSULE | ORAL | Status: DC
Start: 2019-07-08 — End: 2019-07-07

## 2019-07-07 MED ORDER — FUROSEMIDE 10 MG/ML IJ SOLN
60.00 | INTRAMUSCULAR | Status: DC
Start: 2019-07-07 — End: 2019-07-07

## 2019-07-07 MED ORDER — GLUCOSE-VITAMIN C 4-6 GM-MG PO CHEW
12.00 | CHEWABLE_TABLET | ORAL | Status: DC
Start: ? — End: 2019-07-07

## 2019-07-13 DIAGNOSIS — J441 Chronic obstructive pulmonary disease with (acute) exacerbation: Secondary | ICD-10-CM | POA: Diagnosis not present

## 2019-07-13 DIAGNOSIS — M5441 Lumbago with sciatica, right side: Secondary | ICD-10-CM | POA: Diagnosis not present

## 2019-07-13 DIAGNOSIS — M5442 Lumbago with sciatica, left side: Secondary | ICD-10-CM | POA: Diagnosis not present

## 2019-07-13 DIAGNOSIS — I5043 Acute on chronic combined systolic (congestive) and diastolic (congestive) heart failure: Secondary | ICD-10-CM | POA: Diagnosis not present

## 2019-07-13 DIAGNOSIS — G8929 Other chronic pain: Secondary | ICD-10-CM | POA: Diagnosis not present

## 2019-07-13 DIAGNOSIS — I10 Essential (primary) hypertension: Secondary | ICD-10-CM | POA: Diagnosis not present

## 2019-07-13 DIAGNOSIS — R0902 Hypoxemia: Secondary | ICD-10-CM | POA: Diagnosis not present

## 2019-07-14 DIAGNOSIS — M47816 Spondylosis without myelopathy or radiculopathy, lumbar region: Secondary | ICD-10-CM | POA: Diagnosis not present

## 2019-07-14 DIAGNOSIS — Z1389 Encounter for screening for other disorder: Secondary | ICD-10-CM | POA: Diagnosis not present

## 2019-07-14 DIAGNOSIS — M961 Postlaminectomy syndrome, not elsewhere classified: Secondary | ICD-10-CM | POA: Diagnosis not present

## 2019-07-14 DIAGNOSIS — M5416 Radiculopathy, lumbar region: Secondary | ICD-10-CM | POA: Diagnosis not present

## 2019-07-14 DIAGNOSIS — Z79891 Long term (current) use of opiate analgesic: Secondary | ICD-10-CM | POA: Diagnosis not present

## 2019-07-14 DIAGNOSIS — G894 Chronic pain syndrome: Secondary | ICD-10-CM | POA: Diagnosis not present

## 2019-07-30 DIAGNOSIS — Z89611 Acquired absence of right leg above knee: Secondary | ICD-10-CM | POA: Diagnosis not present

## 2019-08-10 IMAGING — DX PORTABLE CHEST - 1 VIEW
1 series · 1 of 1 positions shown · non-contrast
Comparison: Portable exam 6141 hours compared to 09/12/2018 at 9909
hours

CLINICAL DATA: Chest pain, hypertension, diabetes mellitus,
coronary artery disease post MI, GERD

EXAM:
PORTABLE CHEST 1 VIEW

[chest ap]
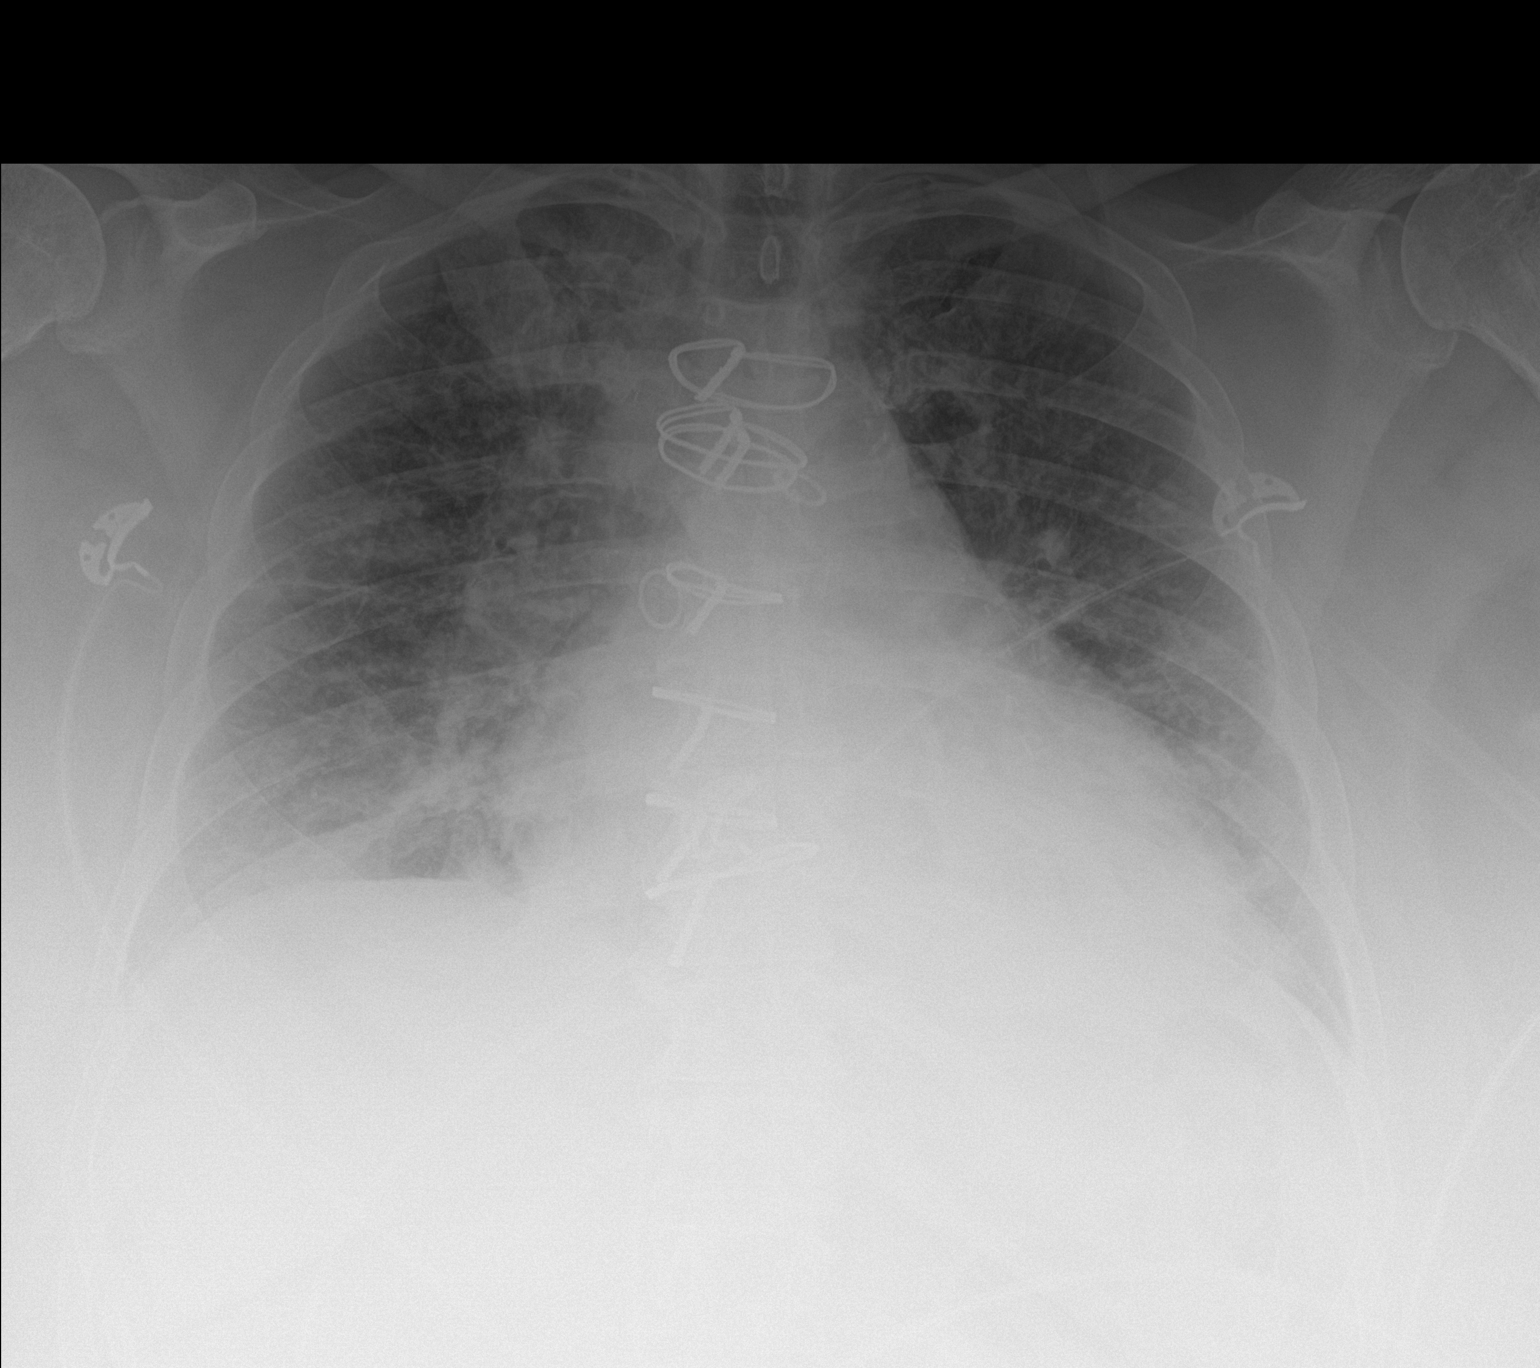

[1 of 1 positions shown; findings below may reference images not displayed]

FINDINGS: Enlargement of cardiac silhouette post CABG.

Slight pulmonary vascular congestion.

Pulmonary infiltrates favor pulmonary edema over infection.

No pleural effusion or pneumothorax.

Bones demineralized.
IMPRESSION: Enlargement of cardiac silhouette post CABG with pulmonary vascular
congestion and suspected mild pulmonary edema.

## 2019-08-13 IMAGING — DX PORTABLE CHEST - 1 VIEW
1 series · 1 of 1 positions shown · non-contrast
Comparison: 09/12/2018

CLINICAL DATA: Shortness of breath and cough

EXAM:
PORTABLE CHEST 1 VIEW

[chest ap]
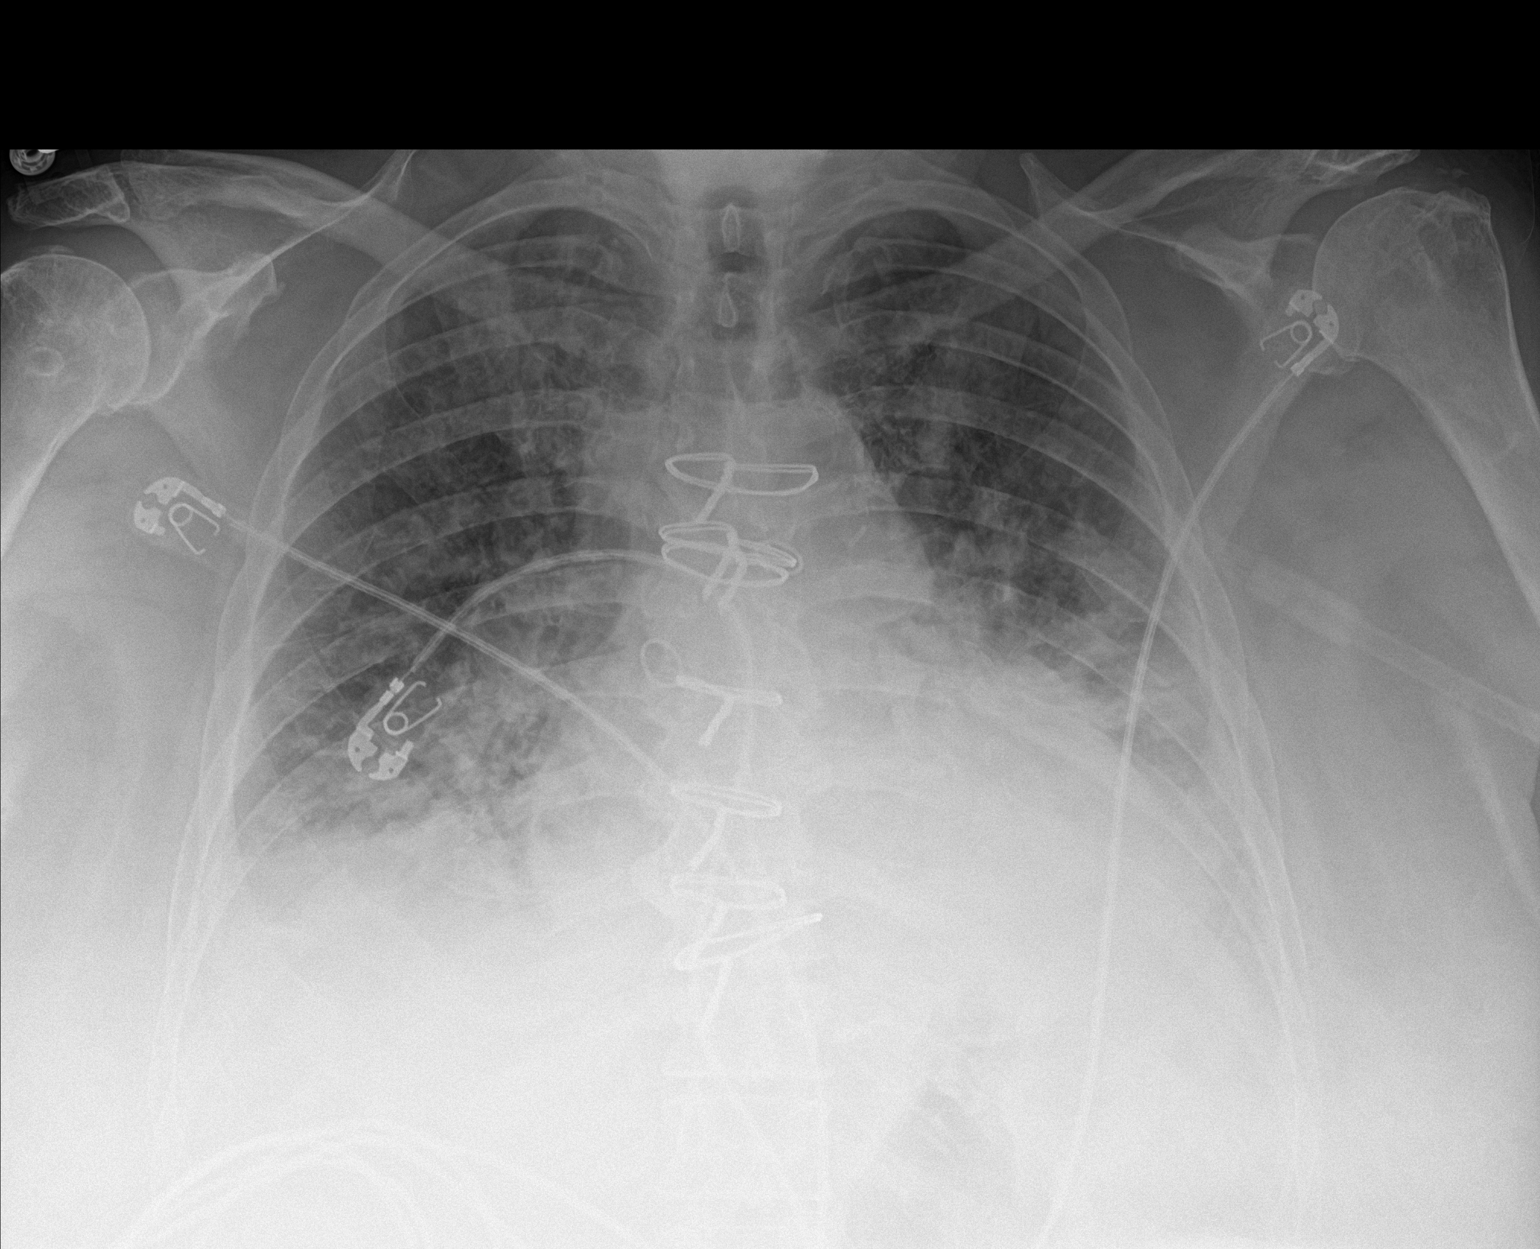

[1 of 1 positions shown; findings below may reference images not displayed]

FINDINGS: Cardiac shadow is within normal limits. Lungs are well aerated
bilaterally. Diffuse patchy opacities are noted bilaterally
increased in the interval from the prior exam consistent with CHF.
Underlying atelectasis/early infiltrate is noted in the bases
bilaterally. No pneumothorax is seen. The bony structures are within
normal limits. Postsurgical changes are again noted.
IMPRESSION: Changes consistent with CHF with superimposed bibasilar
atelectasis/early infiltrates.

## 2019-08-14 DIAGNOSIS — M961 Postlaminectomy syndrome, not elsewhere classified: Secondary | ICD-10-CM | POA: Diagnosis not present

## 2019-08-14 DIAGNOSIS — Z1389 Encounter for screening for other disorder: Secondary | ICD-10-CM | POA: Diagnosis not present

## 2019-08-14 DIAGNOSIS — M47816 Spondylosis without myelopathy or radiculopathy, lumbar region: Secondary | ICD-10-CM | POA: Diagnosis not present

## 2019-08-14 DIAGNOSIS — Z79891 Long term (current) use of opiate analgesic: Secondary | ICD-10-CM | POA: Diagnosis not present

## 2019-08-14 DIAGNOSIS — G894 Chronic pain syndrome: Secondary | ICD-10-CM | POA: Diagnosis not present

## 2019-08-14 DIAGNOSIS — M5416 Radiculopathy, lumbar region: Secondary | ICD-10-CM | POA: Diagnosis not present

## 2019-08-14 IMAGING — DX PORTABLE CHEST - 1 VIEW
1 series · 1 of 1 positions shown · non-contrast
Comparison: 09/15/2018

CLINICAL DATA: CHF

EXAM:
PORTABLE CHEST 1 VIEW

[chest]
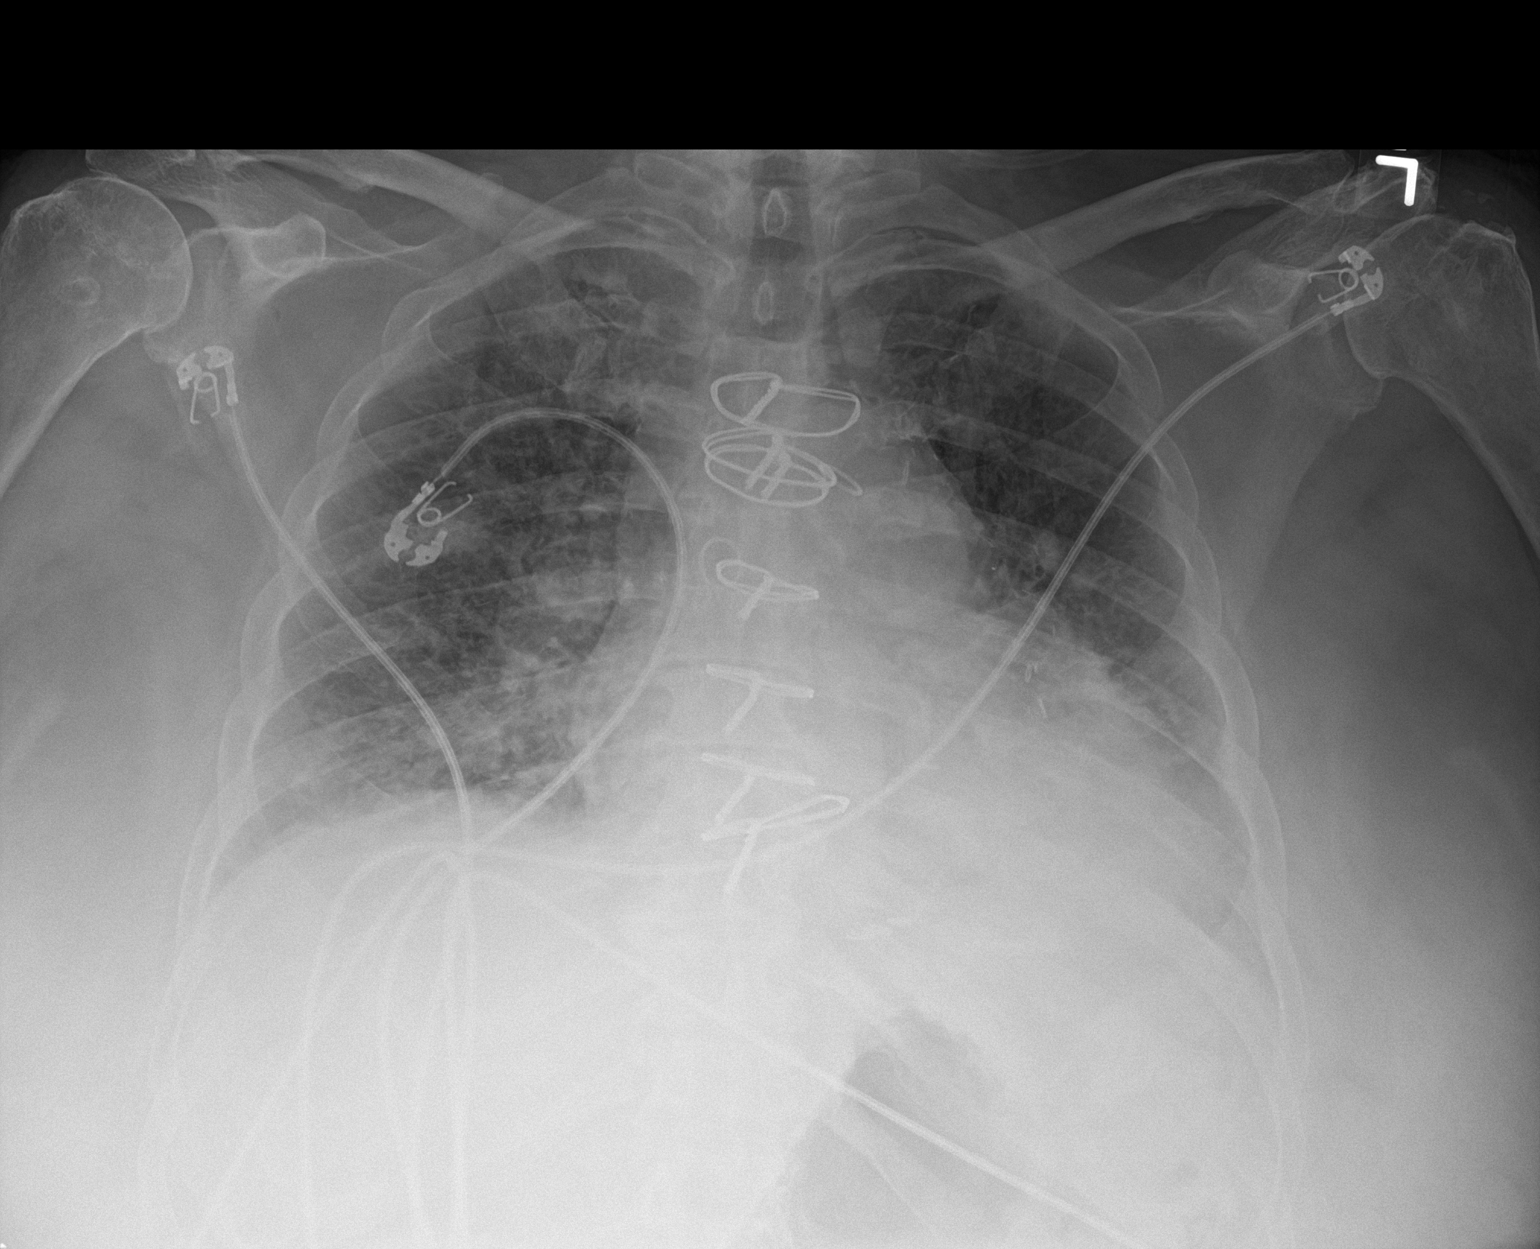

[1 of 1 positions shown; findings below may reference images not displayed]

FINDINGS: Prior CABG. Cardiomegaly. Bilateral interstitial and airspace
opacities, likely edema/CHF, slightly improved since prior study.
Improving effusions. No acute bony abnormality.
IMPRESSION: Improving pulmonary edema and pleural effusions. Continued mild CHF.

## 2019-08-25 DIAGNOSIS — I739 Peripheral vascular disease, unspecified: Secondary | ICD-10-CM | POA: Diagnosis not present

## 2019-08-25 DIAGNOSIS — R0602 Shortness of breath: Secondary | ICD-10-CM | POA: Diagnosis not present

## 2019-08-25 DIAGNOSIS — E118 Type 2 diabetes mellitus with unspecified complications: Secondary | ICD-10-CM | POA: Diagnosis not present

## 2019-08-25 DIAGNOSIS — I130 Hypertensive heart and chronic kidney disease with heart failure and stage 1 through stage 4 chronic kidney disease, or unspecified chronic kidney disease: Secondary | ICD-10-CM | POA: Diagnosis not present

## 2019-08-25 DIAGNOSIS — I509 Heart failure, unspecified: Secondary | ICD-10-CM | POA: Diagnosis not present

## 2019-08-25 DIAGNOSIS — Z794 Long term (current) use of insulin: Secondary | ICD-10-CM | POA: Diagnosis not present

## 2019-08-25 DIAGNOSIS — J9611 Chronic respiratory failure with hypoxia: Secondary | ICD-10-CM | POA: Diagnosis not present

## 2019-08-25 DIAGNOSIS — I517 Cardiomegaly: Secondary | ICD-10-CM | POA: Diagnosis not present

## 2019-08-25 DIAGNOSIS — I10 Essential (primary) hypertension: Secondary | ICD-10-CM | POA: Diagnosis not present

## 2019-08-25 DIAGNOSIS — E1151 Type 2 diabetes mellitus with diabetic peripheral angiopathy without gangrene: Secondary | ICD-10-CM | POA: Diagnosis not present

## 2019-08-25 DIAGNOSIS — R0989 Other specified symptoms and signs involving the circulatory and respiratory systems: Secondary | ICD-10-CM | POA: Diagnosis not present

## 2019-08-25 DIAGNOSIS — I5043 Acute on chronic combined systolic (congestive) and diastolic (congestive) heart failure: Secondary | ICD-10-CM | POA: Diagnosis not present

## 2019-08-25 DIAGNOSIS — Z89611 Acquired absence of right leg above knee: Secondary | ICD-10-CM | POA: Diagnosis not present

## 2019-08-25 DIAGNOSIS — E785 Hyperlipidemia, unspecified: Secondary | ICD-10-CM | POA: Diagnosis not present

## 2019-08-25 DIAGNOSIS — N183 Chronic kidney disease, stage 3 unspecified: Secondary | ICD-10-CM | POA: Diagnosis not present

## 2019-08-25 DIAGNOSIS — Z720 Tobacco use: Secondary | ICD-10-CM | POA: Diagnosis not present

## 2019-08-25 DIAGNOSIS — M7989 Other specified soft tissue disorders: Secondary | ICD-10-CM | POA: Diagnosis not present

## 2019-08-25 DIAGNOSIS — R591 Generalized enlarged lymph nodes: Secondary | ICD-10-CM | POA: Diagnosis not present

## 2019-08-25 DIAGNOSIS — I2510 Atherosclerotic heart disease of native coronary artery without angina pectoris: Secondary | ICD-10-CM | POA: Diagnosis not present

## 2019-08-25 DIAGNOSIS — Z20822 Contact with and (suspected) exposure to covid-19: Secondary | ICD-10-CM | POA: Diagnosis not present

## 2019-08-25 DIAGNOSIS — I2699 Other pulmonary embolism without acute cor pulmonale: Secondary | ICD-10-CM | POA: Diagnosis not present

## 2019-08-25 DIAGNOSIS — E1122 Type 2 diabetes mellitus with diabetic chronic kidney disease: Secondary | ICD-10-CM | POA: Diagnosis not present

## 2019-08-25 DIAGNOSIS — I110 Hypertensive heart disease with heart failure: Secondary | ICD-10-CM | POA: Diagnosis not present

## 2019-08-25 DIAGNOSIS — J9621 Acute and chronic respiratory failure with hypoxia: Secondary | ICD-10-CM | POA: Diagnosis not present

## 2019-08-25 DIAGNOSIS — Z951 Presence of aortocoronary bypass graft: Secondary | ICD-10-CM | POA: Diagnosis not present

## 2019-08-25 DIAGNOSIS — R109 Unspecified abdominal pain: Secondary | ICD-10-CM | POA: Diagnosis not present

## 2019-08-25 DIAGNOSIS — Z03818 Encounter for observation for suspected exposure to other biological agents ruled out: Secondary | ICD-10-CM | POA: Diagnosis not present

## 2019-08-25 DIAGNOSIS — E1165 Type 2 diabetes mellitus with hyperglycemia: Secondary | ICD-10-CM | POA: Diagnosis not present

## 2019-08-25 DIAGNOSIS — R05 Cough: Secondary | ICD-10-CM | POA: Diagnosis not present

## 2019-08-25 DIAGNOSIS — R69 Illness, unspecified: Secondary | ICD-10-CM | POA: Diagnosis not present

## 2019-08-25 DIAGNOSIS — N390 Urinary tract infection, site not specified: Secondary | ICD-10-CM | POA: Diagnosis not present

## 2019-08-25 DIAGNOSIS — I2693 Single subsegmental pulmonary embolism without acute cor pulmonale: Secondary | ICD-10-CM | POA: Diagnosis not present

## 2019-08-25 DIAGNOSIS — J441 Chronic obstructive pulmonary disease with (acute) exacerbation: Secondary | ICD-10-CM | POA: Diagnosis not present

## 2019-09-08 ENCOUNTER — Ambulatory Visit: Payer: Medicare HMO | Admitting: Cardiology

## 2019-09-20 ENCOUNTER — Ambulatory Visit: Payer: Medicare HMO | Admitting: Cardiology

## 2019-09-20 DIAGNOSIS — J441 Chronic obstructive pulmonary disease with (acute) exacerbation: Secondary | ICD-10-CM | POA: Diagnosis not present

## 2019-09-20 DIAGNOSIS — I2694 Multiple subsegmental pulmonary emboli without acute cor pulmonale: Secondary | ICD-10-CM | POA: Diagnosis not present

## 2019-09-20 DIAGNOSIS — E785 Hyperlipidemia, unspecified: Secondary | ICD-10-CM | POA: Diagnosis not present

## 2019-09-20 DIAGNOSIS — M545 Low back pain: Secondary | ICD-10-CM | POA: Diagnosis not present

## 2019-09-20 DIAGNOSIS — E1169 Type 2 diabetes mellitus with other specified complication: Secondary | ICD-10-CM | POA: Diagnosis not present

## 2019-09-20 DIAGNOSIS — I5043 Acute on chronic combined systolic (congestive) and diastolic (congestive) heart failure: Secondary | ICD-10-CM | POA: Diagnosis not present

## 2019-09-20 DIAGNOSIS — N183 Chronic kidney disease, stage 3 unspecified: Secondary | ICD-10-CM | POA: Diagnosis not present

## 2019-09-20 DIAGNOSIS — I10 Essential (primary) hypertension: Secondary | ICD-10-CM | POA: Diagnosis not present

## 2019-09-20 DIAGNOSIS — R0902 Hypoxemia: Secondary | ICD-10-CM | POA: Diagnosis not present

## 2019-09-20 DIAGNOSIS — J9601 Acute respiratory failure with hypoxia: Secondary | ICD-10-CM | POA: Diagnosis not present

## 2019-09-25 DIAGNOSIS — R918 Other nonspecific abnormal finding of lung field: Secondary | ICD-10-CM | POA: Diagnosis not present

## 2019-09-25 DIAGNOSIS — I2699 Other pulmonary embolism without acute cor pulmonale: Secondary | ICD-10-CM | POA: Diagnosis not present

## 2019-09-25 DIAGNOSIS — R0600 Dyspnea, unspecified: Secondary | ICD-10-CM | POA: Diagnosis not present

## 2019-09-25 DIAGNOSIS — J9611 Chronic respiratory failure with hypoxia: Secondary | ICD-10-CM | POA: Diagnosis not present

## 2019-09-25 DIAGNOSIS — R0602 Shortness of breath: Secondary | ICD-10-CM | POA: Diagnosis not present

## 2019-09-25 DIAGNOSIS — R69 Illness, unspecified: Secondary | ICD-10-CM | POA: Diagnosis not present

## 2019-09-27 DIAGNOSIS — Z89611 Acquired absence of right leg above knee: Secondary | ICD-10-CM | POA: Diagnosis not present

## 2019-10-02 DIAGNOSIS — I5042 Chronic combined systolic (congestive) and diastolic (congestive) heart failure: Secondary | ICD-10-CM | POA: Diagnosis not present

## 2019-10-02 DIAGNOSIS — I255 Ischemic cardiomyopathy: Secondary | ICD-10-CM | POA: Diagnosis not present

## 2019-10-02 DIAGNOSIS — Z7901 Long term (current) use of anticoagulants: Secondary | ICD-10-CM | POA: Diagnosis not present

## 2019-10-02 DIAGNOSIS — I10 Essential (primary) hypertension: Secondary | ICD-10-CM | POA: Diagnosis not present

## 2019-10-02 DIAGNOSIS — I2694 Multiple subsegmental pulmonary emboli without acute cor pulmonale: Secondary | ICD-10-CM | POA: Diagnosis not present

## 2019-10-02 DIAGNOSIS — R69 Illness, unspecified: Secondary | ICD-10-CM | POA: Diagnosis not present

## 2019-10-02 DIAGNOSIS — N1831 Chronic kidney disease, stage 3a: Secondary | ICD-10-CM | POA: Diagnosis not present

## 2019-10-05 DIAGNOSIS — J9610 Chronic respiratory failure, unspecified whether with hypoxia or hypercapnia: Secondary | ICD-10-CM | POA: Diagnosis not present

## 2019-10-12 DIAGNOSIS — L97921 Non-pressure chronic ulcer of unspecified part of left lower leg limited to breakdown of skin: Secondary | ICD-10-CM | POA: Diagnosis not present

## 2019-10-12 DIAGNOSIS — I10 Essential (primary) hypertension: Secondary | ICD-10-CM | POA: Diagnosis not present

## 2019-10-12 DIAGNOSIS — N1831 Chronic kidney disease, stage 3a: Secondary | ICD-10-CM | POA: Diagnosis not present

## 2019-10-12 DIAGNOSIS — I5042 Chronic combined systolic (congestive) and diastolic (congestive) heart failure: Secondary | ICD-10-CM | POA: Diagnosis not present

## 2019-10-12 DIAGNOSIS — R69 Illness, unspecified: Secondary | ICD-10-CM | POA: Diagnosis not present

## 2019-10-12 DIAGNOSIS — J9611 Chronic respiratory failure with hypoxia: Secondary | ICD-10-CM | POA: Diagnosis not present

## 2019-10-21 DIAGNOSIS — R069 Unspecified abnormalities of breathing: Secondary | ICD-10-CM | POA: Diagnosis not present

## 2019-10-21 DIAGNOSIS — E1165 Type 2 diabetes mellitus with hyperglycemia: Secondary | ICD-10-CM | POA: Diagnosis not present

## 2019-10-21 DIAGNOSIS — R0902 Hypoxemia: Secondary | ICD-10-CM | POA: Diagnosis not present

## 2019-10-21 DIAGNOSIS — R0602 Shortness of breath: Secondary | ICD-10-CM | POA: Diagnosis not present

## 2019-10-21 DIAGNOSIS — R05 Cough: Secondary | ICD-10-CM | POA: Diagnosis not present

## 2019-10-22 DIAGNOSIS — I739 Peripheral vascular disease, unspecified: Secondary | ICD-10-CM | POA: Diagnosis not present

## 2019-10-22 DIAGNOSIS — Z86718 Personal history of other venous thrombosis and embolism: Secondary | ICD-10-CM | POA: Diagnosis not present

## 2019-10-22 DIAGNOSIS — Z03818 Encounter for observation for suspected exposure to other biological agents ruled out: Secondary | ICD-10-CM | POA: Diagnosis not present

## 2019-10-22 DIAGNOSIS — L89152 Pressure ulcer of sacral region, stage 2: Secondary | ICD-10-CM | POA: Diagnosis not present

## 2019-10-22 DIAGNOSIS — E119 Type 2 diabetes mellitus without complications: Secondary | ICD-10-CM | POA: Diagnosis not present

## 2019-10-22 DIAGNOSIS — E1143 Type 2 diabetes mellitus with diabetic autonomic (poly)neuropathy: Secondary | ICD-10-CM | POA: Diagnosis not present

## 2019-10-22 DIAGNOSIS — I130 Hypertensive heart and chronic kidney disease with heart failure and stage 1 through stage 4 chronic kidney disease, or unspecified chronic kidney disease: Secondary | ICD-10-CM | POA: Diagnosis not present

## 2019-10-22 DIAGNOSIS — J441 Chronic obstructive pulmonary disease with (acute) exacerbation: Secondary | ICD-10-CM | POA: Diagnosis not present

## 2019-10-22 DIAGNOSIS — G8929 Other chronic pain: Secondary | ICD-10-CM | POA: Diagnosis not present

## 2019-10-22 DIAGNOSIS — I5043 Acute on chronic combined systolic (congestive) and diastolic (congestive) heart failure: Secondary | ICD-10-CM | POA: Diagnosis not present

## 2019-10-22 DIAGNOSIS — Z794 Long term (current) use of insulin: Secondary | ICD-10-CM | POA: Diagnosis not present

## 2019-10-22 DIAGNOSIS — I2510 Atherosclerotic heart disease of native coronary artery without angina pectoris: Secondary | ICD-10-CM | POA: Diagnosis not present

## 2019-10-22 DIAGNOSIS — I214 Non-ST elevation (NSTEMI) myocardial infarction: Secondary | ICD-10-CM | POA: Diagnosis not present

## 2019-10-22 DIAGNOSIS — E1122 Type 2 diabetes mellitus with diabetic chronic kidney disease: Secondary | ICD-10-CM | POA: Diagnosis not present

## 2019-10-22 DIAGNOSIS — R0600 Dyspnea, unspecified: Secondary | ICD-10-CM | POA: Diagnosis not present

## 2019-10-22 DIAGNOSIS — L89310 Pressure ulcer of right buttock, unstageable: Secondary | ICD-10-CM | POA: Diagnosis not present

## 2019-10-22 DIAGNOSIS — K3184 Gastroparesis: Secondary | ICD-10-CM | POA: Diagnosis not present

## 2019-10-22 DIAGNOSIS — I5020 Unspecified systolic (congestive) heart failure: Secondary | ICD-10-CM | POA: Diagnosis not present

## 2019-10-22 DIAGNOSIS — N1831 Chronic kidney disease, stage 3a: Secondary | ICD-10-CM | POA: Diagnosis not present

## 2019-10-22 DIAGNOSIS — R778 Other specified abnormalities of plasma proteins: Secondary | ICD-10-CM | POA: Diagnosis not present

## 2019-10-22 DIAGNOSIS — E118 Type 2 diabetes mellitus with unspecified complications: Secondary | ICD-10-CM | POA: Diagnosis not present

## 2019-10-22 DIAGNOSIS — Z20822 Contact with and (suspected) exposure to covid-19: Secondary | ICD-10-CM | POA: Diagnosis not present

## 2019-10-22 DIAGNOSIS — I351 Nonrheumatic aortic (valve) insufficiency: Secondary | ICD-10-CM | POA: Diagnosis not present

## 2019-10-22 DIAGNOSIS — I10 Essential (primary) hypertension: Secondary | ICD-10-CM | POA: Diagnosis not present

## 2019-10-22 DIAGNOSIS — J9621 Acute and chronic respiratory failure with hypoxia: Secondary | ICD-10-CM | POA: Diagnosis not present

## 2019-10-22 DIAGNOSIS — J9620 Acute and chronic respiratory failure, unspecified whether with hypoxia or hypercapnia: Secondary | ICD-10-CM | POA: Diagnosis not present

## 2019-10-22 DIAGNOSIS — J189 Pneumonia, unspecified organism: Secondary | ICD-10-CM | POA: Diagnosis not present

## 2019-10-22 DIAGNOSIS — E1151 Type 2 diabetes mellitus with diabetic peripheral angiopathy without gangrene: Secondary | ICD-10-CM | POA: Diagnosis not present

## 2019-10-22 DIAGNOSIS — I340 Nonrheumatic mitral (valve) insufficiency: Secondary | ICD-10-CM | POA: Diagnosis not present

## 2019-10-22 DIAGNOSIS — I509 Heart failure, unspecified: Secondary | ICD-10-CM | POA: Diagnosis not present

## 2019-10-22 DIAGNOSIS — J449 Chronic obstructive pulmonary disease, unspecified: Secondary | ICD-10-CM | POA: Diagnosis not present

## 2019-10-27 DIAGNOSIS — Z89611 Acquired absence of right leg above knee: Secondary | ICD-10-CM | POA: Diagnosis not present

## 2019-11-09 DIAGNOSIS — N1831 Chronic kidney disease, stage 3a: Secondary | ICD-10-CM | POA: Diagnosis not present

## 2019-11-09 DIAGNOSIS — I5043 Acute on chronic combined systolic (congestive) and diastolic (congestive) heart failure: Secondary | ICD-10-CM | POA: Diagnosis not present

## 2019-11-09 DIAGNOSIS — Z598 Other problems related to housing and economic circumstances: Secondary | ICD-10-CM | POA: Diagnosis not present

## 2019-11-09 DIAGNOSIS — E119 Type 2 diabetes mellitus without complications: Secondary | ICD-10-CM | POA: Diagnosis not present

## 2019-11-09 DIAGNOSIS — I5042 Chronic combined systolic (congestive) and diastolic (congestive) heart failure: Secondary | ICD-10-CM | POA: Diagnosis not present

## 2019-11-09 DIAGNOSIS — I2510 Atherosclerotic heart disease of native coronary artery without angina pectoris: Secondary | ICD-10-CM | POA: Diagnosis not present

## 2019-11-09 DIAGNOSIS — I10 Essential (primary) hypertension: Secondary | ICD-10-CM | POA: Diagnosis not present

## 2019-11-09 DIAGNOSIS — I255 Ischemic cardiomyopathy: Secondary | ICD-10-CM | POA: Diagnosis not present

## 2019-11-27 DIAGNOSIS — Z89611 Acquired absence of right leg above knee: Secondary | ICD-10-CM | POA: Diagnosis not present

## 2019-11-30 DIAGNOSIS — J432 Centrilobular emphysema: Secondary | ICD-10-CM | POA: Diagnosis not present

## 2019-11-30 DIAGNOSIS — I700 Atherosclerosis of aorta: Secondary | ICD-10-CM | POA: Diagnosis not present

## 2019-11-30 DIAGNOSIS — R918 Other nonspecific abnormal finding of lung field: Secondary | ICD-10-CM | POA: Diagnosis not present

## 2019-12-11 DIAGNOSIS — R0602 Shortness of breath: Secondary | ICD-10-CM | POA: Diagnosis not present

## 2019-12-11 DIAGNOSIS — Z87891 Personal history of nicotine dependence: Secondary | ICD-10-CM | POA: Diagnosis not present

## 2019-12-11 DIAGNOSIS — G479 Sleep disorder, unspecified: Secondary | ICD-10-CM | POA: Diagnosis not present

## 2019-12-11 DIAGNOSIS — R5383 Other fatigue: Secondary | ICD-10-CM | POA: Diagnosis not present

## 2019-12-11 DIAGNOSIS — R0683 Snoring: Secondary | ICD-10-CM | POA: Diagnosis not present

## 2019-12-11 DIAGNOSIS — J984 Other disorders of lung: Secondary | ICD-10-CM | POA: Diagnosis not present

## 2019-12-11 DIAGNOSIS — I2699 Other pulmonary embolism without acute cor pulmonale: Secondary | ICD-10-CM | POA: Diagnosis not present

## 2019-12-11 DIAGNOSIS — R918 Other nonspecific abnormal finding of lung field: Secondary | ICD-10-CM | POA: Diagnosis not present

## 2019-12-19 DIAGNOSIS — Z89611 Acquired absence of right leg above knee: Secondary | ICD-10-CM | POA: Diagnosis not present

## 2019-12-19 DIAGNOSIS — E1169 Type 2 diabetes mellitus with other specified complication: Secondary | ICD-10-CM | POA: Diagnosis not present

## 2019-12-19 DIAGNOSIS — E11622 Type 2 diabetes mellitus with other skin ulcer: Secondary | ICD-10-CM | POA: Diagnosis not present

## 2019-12-19 DIAGNOSIS — L89153 Pressure ulcer of sacral region, stage 3: Secondary | ICD-10-CM | POA: Diagnosis not present

## 2019-12-19 DIAGNOSIS — L89313 Pressure ulcer of right buttock, stage 3: Secondary | ICD-10-CM | POA: Diagnosis not present

## 2019-12-20 DIAGNOSIS — L89313 Pressure ulcer of right buttock, stage 3: Secondary | ICD-10-CM | POA: Diagnosis not present

## 2019-12-20 DIAGNOSIS — L89153 Pressure ulcer of sacral region, stage 3: Secondary | ICD-10-CM | POA: Diagnosis not present

## 2019-12-20 DIAGNOSIS — E11622 Type 2 diabetes mellitus with other skin ulcer: Secondary | ICD-10-CM | POA: Diagnosis not present

## 2019-12-27 DIAGNOSIS — Z89611 Acquired absence of right leg above knee: Secondary | ICD-10-CM | POA: Diagnosis not present

## 2020-01-04 DIAGNOSIS — I509 Heart failure, unspecified: Secondary | ICD-10-CM | POA: Diagnosis not present

## 2020-01-27 DIAGNOSIS — Z89611 Acquired absence of right leg above knee: Secondary | ICD-10-CM | POA: Diagnosis not present

## 2020-01-30 DIAGNOSIS — I351 Nonrheumatic aortic (valve) insufficiency: Secondary | ICD-10-CM | POA: Diagnosis not present

## 2020-01-30 DIAGNOSIS — I119 Hypertensive heart disease without heart failure: Secondary | ICD-10-CM | POA: Diagnosis not present

## 2020-01-30 DIAGNOSIS — E875 Hyperkalemia: Secondary | ICD-10-CM | POA: Diagnosis not present

## 2020-01-30 DIAGNOSIS — R0602 Shortness of breath: Secondary | ICD-10-CM | POA: Diagnosis not present

## 2020-01-30 DIAGNOSIS — I161 Hypertensive emergency: Secondary | ICD-10-CM | POA: Diagnosis not present

## 2020-01-30 DIAGNOSIS — I313 Pericardial effusion (noninflammatory): Secondary | ICD-10-CM | POA: Diagnosis not present

## 2020-01-30 DIAGNOSIS — N178 Other acute kidney failure: Secondary | ICD-10-CM | POA: Diagnosis not present

## 2020-01-30 DIAGNOSIS — E785 Hyperlipidemia, unspecified: Secondary | ICD-10-CM | POA: Diagnosis not present

## 2020-01-30 DIAGNOSIS — E1169 Type 2 diabetes mellitus with other specified complication: Secondary | ICD-10-CM | POA: Diagnosis not present

## 2020-01-30 DIAGNOSIS — I5023 Acute on chronic systolic (congestive) heart failure: Secondary | ICD-10-CM | POA: Diagnosis not present

## 2020-01-30 DIAGNOSIS — E669 Obesity, unspecified: Secondary | ICD-10-CM | POA: Diagnosis not present

## 2020-01-30 DIAGNOSIS — L03116 Cellulitis of left lower limb: Secondary | ICD-10-CM | POA: Diagnosis not present

## 2020-01-30 DIAGNOSIS — I110 Hypertensive heart disease with heart failure: Secondary | ICD-10-CM | POA: Diagnosis not present

## 2020-01-30 DIAGNOSIS — Z7901 Long term (current) use of anticoagulants: Secondary | ICD-10-CM | POA: Diagnosis not present

## 2020-01-30 DIAGNOSIS — I130 Hypertensive heart and chronic kidney disease with heart failure and stage 1 through stage 4 chronic kidney disease, or unspecified chronic kidney disease: Secondary | ICD-10-CM | POA: Diagnosis not present

## 2020-01-30 DIAGNOSIS — J811 Chronic pulmonary edema: Secondary | ICD-10-CM | POA: Diagnosis not present

## 2020-01-30 DIAGNOSIS — I160 Hypertensive urgency: Secondary | ICD-10-CM | POA: Diagnosis not present

## 2020-01-30 DIAGNOSIS — I361 Nonrheumatic tricuspid (valve) insufficiency: Secondary | ICD-10-CM | POA: Diagnosis not present

## 2020-01-30 DIAGNOSIS — E1165 Type 2 diabetes mellitus with hyperglycemia: Secondary | ICD-10-CM | POA: Diagnosis not present

## 2020-01-30 DIAGNOSIS — I700 Atherosclerosis of aorta: Secondary | ICD-10-CM | POA: Diagnosis not present

## 2020-01-30 DIAGNOSIS — I517 Cardiomegaly: Secondary | ICD-10-CM | POA: Diagnosis not present

## 2020-01-30 DIAGNOSIS — I509 Heart failure, unspecified: Secondary | ICD-10-CM | POA: Diagnosis not present

## 2020-01-30 DIAGNOSIS — I2510 Atherosclerotic heart disease of native coronary artery without angina pectoris: Secondary | ICD-10-CM | POA: Diagnosis not present

## 2020-01-30 DIAGNOSIS — I255 Ischemic cardiomyopathy: Secondary | ICD-10-CM | POA: Diagnosis not present

## 2020-01-30 DIAGNOSIS — R52 Pain, unspecified: Secondary | ICD-10-CM | POA: Diagnosis not present

## 2020-01-30 DIAGNOSIS — I959 Hypotension, unspecified: Secondary | ICD-10-CM | POA: Diagnosis not present

## 2020-01-30 DIAGNOSIS — J9601 Acute respiratory failure with hypoxia: Secondary | ICD-10-CM | POA: Diagnosis not present

## 2020-01-30 DIAGNOSIS — I340 Nonrheumatic mitral (valve) insufficiency: Secondary | ICD-10-CM | POA: Diagnosis not present

## 2020-01-30 DIAGNOSIS — R069 Unspecified abnormalities of breathing: Secondary | ICD-10-CM | POA: Diagnosis not present

## 2020-01-30 DIAGNOSIS — T671XXA Heat syncope, initial encounter: Secondary | ICD-10-CM | POA: Diagnosis not present

## 2020-01-30 DIAGNOSIS — J90 Pleural effusion, not elsewhere classified: Secondary | ICD-10-CM | POA: Diagnosis not present

## 2020-01-30 DIAGNOSIS — I5020 Unspecified systolic (congestive) heart failure: Secondary | ICD-10-CM | POA: Diagnosis not present

## 2020-01-30 DIAGNOSIS — N183 Chronic kidney disease, stage 3 unspecified: Secondary | ICD-10-CM | POA: Diagnosis not present

## 2020-01-30 DIAGNOSIS — I739 Peripheral vascular disease, unspecified: Secondary | ICD-10-CM | POA: Diagnosis not present

## 2020-01-30 DIAGNOSIS — Z720 Tobacco use: Secondary | ICD-10-CM | POA: Diagnosis not present

## 2020-01-31 DIAGNOSIS — I351 Nonrheumatic aortic (valve) insufficiency: Secondary | ICD-10-CM

## 2020-01-31 DIAGNOSIS — J90 Pleural effusion, not elsewhere classified: Secondary | ICD-10-CM

## 2020-01-31 DIAGNOSIS — I361 Nonrheumatic tricuspid (valve) insufficiency: Secondary | ICD-10-CM

## 2020-01-31 DIAGNOSIS — I340 Nonrheumatic mitral (valve) insufficiency: Secondary | ICD-10-CM

## 2020-02-03 DIAGNOSIS — I739 Peripheral vascular disease, unspecified: Secondary | ICD-10-CM

## 2020-02-03 DIAGNOSIS — I5020 Unspecified systolic (congestive) heart failure: Secondary | ICD-10-CM

## 2020-02-03 DIAGNOSIS — E785 Hyperlipidemia, unspecified: Secondary | ICD-10-CM

## 2020-02-03 DIAGNOSIS — E669 Obesity, unspecified: Secondary | ICD-10-CM

## 2020-02-03 DIAGNOSIS — I2510 Atherosclerotic heart disease of native coronary artery without angina pectoris: Secondary | ICD-10-CM

## 2020-02-03 DIAGNOSIS — E1169 Type 2 diabetes mellitus with other specified complication: Secondary | ICD-10-CM

## 2020-02-03 DIAGNOSIS — Z720 Tobacco use: Secondary | ICD-10-CM

## 2020-02-03 DIAGNOSIS — Z7901 Long term (current) use of anticoagulants: Secondary | ICD-10-CM

## 2020-02-03 DIAGNOSIS — N183 Chronic kidney disease, stage 3 unspecified: Secondary | ICD-10-CM

## 2020-02-10 DIAGNOSIS — R0602 Shortness of breath: Secondary | ICD-10-CM | POA: Diagnosis not present

## 2020-02-10 DIAGNOSIS — M7989 Other specified soft tissue disorders: Secondary | ICD-10-CM | POA: Diagnosis not present

## 2020-02-10 DIAGNOSIS — R0902 Hypoxemia: Secondary | ICD-10-CM | POA: Diagnosis not present

## 2020-02-10 DIAGNOSIS — Z03818 Encounter for observation for suspected exposure to other biological agents ruled out: Secondary | ICD-10-CM | POA: Diagnosis not present

## 2020-02-10 DIAGNOSIS — K589 Irritable bowel syndrome without diarrhea: Secondary | ICD-10-CM | POA: Diagnosis not present

## 2020-02-10 DIAGNOSIS — R569 Unspecified convulsions: Secondary | ICD-10-CM | POA: Diagnosis not present

## 2020-02-10 DIAGNOSIS — I2510 Atherosclerotic heart disease of native coronary artery without angina pectoris: Secondary | ICD-10-CM | POA: Diagnosis not present

## 2020-02-10 DIAGNOSIS — Z8673 Personal history of transient ischemic attack (TIA), and cerebral infarction without residual deficits: Secondary | ICD-10-CM | POA: Diagnosis not present

## 2020-02-10 DIAGNOSIS — J439 Emphysema, unspecified: Secondary | ICD-10-CM | POA: Diagnosis not present

## 2020-02-10 DIAGNOSIS — I509 Heart failure, unspecified: Secondary | ICD-10-CM | POA: Diagnosis not present

## 2020-02-10 DIAGNOSIS — I87312 Chronic venous hypertension (idiopathic) with ulcer of left lower extremity: Secondary | ICD-10-CM | POA: Diagnosis not present

## 2020-02-10 DIAGNOSIS — I501 Left ventricular failure: Secondary | ICD-10-CM | POA: Diagnosis not present

## 2020-02-10 DIAGNOSIS — I517 Cardiomegaly: Secondary | ICD-10-CM | POA: Diagnosis not present

## 2020-02-10 DIAGNOSIS — E1143 Type 2 diabetes mellitus with diabetic autonomic (poly)neuropathy: Secondary | ICD-10-CM | POA: Diagnosis not present

## 2020-02-10 DIAGNOSIS — I252 Old myocardial infarction: Secondary | ICD-10-CM | POA: Diagnosis not present

## 2020-02-10 DIAGNOSIS — J80 Acute respiratory distress syndrome: Secondary | ICD-10-CM | POA: Diagnosis not present

## 2020-02-10 DIAGNOSIS — R55 Syncope and collapse: Secondary | ICD-10-CM | POA: Diagnosis not present

## 2020-02-10 DIAGNOSIS — I959 Hypotension, unspecified: Secondary | ICD-10-CM | POA: Diagnosis not present

## 2020-02-10 DIAGNOSIS — I5022 Chronic systolic (congestive) heart failure: Secondary | ICD-10-CM | POA: Diagnosis not present

## 2020-02-10 DIAGNOSIS — K3184 Gastroparesis: Secondary | ICD-10-CM | POA: Diagnosis not present

## 2020-02-10 DIAGNOSIS — I110 Hypertensive heart disease with heart failure: Secondary | ICD-10-CM | POA: Diagnosis not present

## 2020-02-22 DIAGNOSIS — E782 Mixed hyperlipidemia: Secondary | ICD-10-CM | POA: Diagnosis not present

## 2020-02-22 DIAGNOSIS — Z794 Long term (current) use of insulin: Secondary | ICD-10-CM | POA: Diagnosis not present

## 2020-02-22 DIAGNOSIS — I472 Ventricular tachycardia: Secondary | ICD-10-CM | POA: Diagnosis not present

## 2020-02-22 DIAGNOSIS — E118 Type 2 diabetes mellitus with unspecified complications: Secondary | ICD-10-CM | POA: Diagnosis not present

## 2020-02-22 DIAGNOSIS — I255 Ischemic cardiomyopathy: Secondary | ICD-10-CM | POA: Diagnosis not present

## 2020-02-22 DIAGNOSIS — I739 Peripheral vascular disease, unspecified: Secondary | ICD-10-CM | POA: Diagnosis not present

## 2020-02-22 DIAGNOSIS — I2510 Atherosclerotic heart disease of native coronary artery without angina pectoris: Secondary | ICD-10-CM | POA: Diagnosis not present

## 2020-02-22 DIAGNOSIS — E785 Hyperlipidemia, unspecified: Secondary | ICD-10-CM | POA: Diagnosis not present

## 2020-02-22 DIAGNOSIS — I5042 Chronic combined systolic (congestive) and diastolic (congestive) heart failure: Secondary | ICD-10-CM | POA: Diagnosis not present

## 2020-02-22 DIAGNOSIS — R0989 Other specified symptoms and signs involving the circulatory and respiratory systems: Secondary | ICD-10-CM | POA: Diagnosis not present

## 2020-02-27 DIAGNOSIS — Z89611 Acquired absence of right leg above knee: Secondary | ICD-10-CM | POA: Diagnosis not present

## 2020-03-06 DIAGNOSIS — I509 Heart failure, unspecified: Secondary | ICD-10-CM | POA: Diagnosis not present

## 2020-03-10 DIAGNOSIS — I2510 Atherosclerotic heart disease of native coronary artery without angina pectoris: Secondary | ICD-10-CM | POA: Diagnosis not present

## 2020-03-10 DIAGNOSIS — I509 Heart failure, unspecified: Secondary | ICD-10-CM | POA: Diagnosis not present

## 2020-03-10 DIAGNOSIS — J9621 Acute and chronic respiratory failure with hypoxia: Secondary | ICD-10-CM | POA: Diagnosis not present

## 2020-03-10 DIAGNOSIS — I5023 Acute on chronic systolic (congestive) heart failure: Secondary | ICD-10-CM | POA: Diagnosis not present

## 2020-03-10 DIAGNOSIS — L03116 Cellulitis of left lower limb: Secondary | ICD-10-CM | POA: Diagnosis not present

## 2020-03-10 DIAGNOSIS — R0602 Shortness of breath: Secondary | ICD-10-CM | POA: Diagnosis not present

## 2020-03-10 DIAGNOSIS — J441 Chronic obstructive pulmonary disease with (acute) exacerbation: Secondary | ICD-10-CM | POA: Diagnosis not present

## 2020-03-10 DIAGNOSIS — E785 Hyperlipidemia, unspecified: Secondary | ICD-10-CM | POA: Diagnosis not present

## 2020-03-10 DIAGNOSIS — R0789 Other chest pain: Secondary | ICD-10-CM | POA: Diagnosis not present

## 2020-03-10 DIAGNOSIS — Z951 Presence of aortocoronary bypass graft: Secondary | ICD-10-CM | POA: Diagnosis not present

## 2020-03-10 DIAGNOSIS — I10 Essential (primary) hypertension: Secondary | ICD-10-CM | POA: Diagnosis not present

## 2020-03-10 DIAGNOSIS — K7689 Other specified diseases of liver: Secondary | ICD-10-CM | POA: Diagnosis not present

## 2020-03-10 DIAGNOSIS — Z89619 Acquired absence of unspecified leg above knee: Secondary | ICD-10-CM | POA: Diagnosis not present

## 2020-03-10 DIAGNOSIS — R079 Chest pain, unspecified: Secondary | ICD-10-CM | POA: Diagnosis not present

## 2020-03-10 DIAGNOSIS — R69 Illness, unspecified: Secondary | ICD-10-CM | POA: Diagnosis not present

## 2020-03-10 DIAGNOSIS — Z20822 Contact with and (suspected) exposure to covid-19: Secondary | ICD-10-CM | POA: Diagnosis not present

## 2020-03-10 DIAGNOSIS — E1165 Type 2 diabetes mellitus with hyperglycemia: Secondary | ICD-10-CM | POA: Diagnosis not present

## 2020-03-10 DIAGNOSIS — N170 Acute kidney failure with tubular necrosis: Secondary | ICD-10-CM | POA: Diagnosis not present

## 2020-03-10 DIAGNOSIS — I110 Hypertensive heart disease with heart failure: Secondary | ICD-10-CM | POA: Diagnosis not present

## 2020-03-10 DIAGNOSIS — I739 Peripheral vascular disease, unspecified: Secondary | ICD-10-CM | POA: Diagnosis not present

## 2020-03-10 DIAGNOSIS — I130 Hypertensive heart and chronic kidney disease with heart failure and stage 1 through stage 4 chronic kidney disease, or unspecified chronic kidney disease: Secondary | ICD-10-CM | POA: Diagnosis not present

## 2020-03-10 DIAGNOSIS — R0689 Other abnormalities of breathing: Secondary | ICD-10-CM | POA: Diagnosis not present

## 2020-03-10 DIAGNOSIS — I255 Ischemic cardiomyopathy: Secondary | ICD-10-CM | POA: Diagnosis not present

## 2020-03-11 DIAGNOSIS — I509 Heart failure, unspecified: Secondary | ICD-10-CM

## 2020-03-11 DIAGNOSIS — I739 Peripheral vascular disease, unspecified: Secondary | ICD-10-CM

## 2020-03-11 DIAGNOSIS — I10 Essential (primary) hypertension: Secondary | ICD-10-CM

## 2020-03-11 DIAGNOSIS — I5023 Acute on chronic systolic (congestive) heart failure: Secondary | ICD-10-CM

## 2020-03-11 DIAGNOSIS — E785 Hyperlipidemia, unspecified: Secondary | ICD-10-CM

## 2020-03-11 DIAGNOSIS — I255 Ischemic cardiomyopathy: Secondary | ICD-10-CM

## 2020-03-11 DIAGNOSIS — E1165 Type 2 diabetes mellitus with hyperglycemia: Secondary | ICD-10-CM

## 2020-03-11 DIAGNOSIS — Z951 Presence of aortocoronary bypass graft: Secondary | ICD-10-CM

## 2020-03-11 DIAGNOSIS — Z89619 Acquired absence of unspecified leg above knee: Secondary | ICD-10-CM

## 2020-03-12 DIAGNOSIS — I255 Ischemic cardiomyopathy: Secondary | ICD-10-CM | POA: Diagnosis not present

## 2020-03-12 DIAGNOSIS — Z951 Presence of aortocoronary bypass graft: Secondary | ICD-10-CM | POA: Diagnosis not present

## 2020-03-12 DIAGNOSIS — I509 Heart failure, unspecified: Secondary | ICD-10-CM | POA: Diagnosis not present

## 2020-03-12 DIAGNOSIS — Z89619 Acquired absence of unspecified leg above knee: Secondary | ICD-10-CM | POA: Diagnosis not present

## 2020-03-13 DIAGNOSIS — I255 Ischemic cardiomyopathy: Secondary | ICD-10-CM | POA: Diagnosis not present

## 2020-03-13 DIAGNOSIS — Z89619 Acquired absence of unspecified leg above knee: Secondary | ICD-10-CM | POA: Diagnosis not present

## 2020-03-13 DIAGNOSIS — Z951 Presence of aortocoronary bypass graft: Secondary | ICD-10-CM | POA: Diagnosis not present

## 2020-03-13 DIAGNOSIS — I509 Heart failure, unspecified: Secondary | ICD-10-CM | POA: Diagnosis not present

## 2020-03-14 DIAGNOSIS — Z89619 Acquired absence of unspecified leg above knee: Secondary | ICD-10-CM | POA: Diagnosis not present

## 2020-03-14 DIAGNOSIS — I255 Ischemic cardiomyopathy: Secondary | ICD-10-CM | POA: Diagnosis not present

## 2020-03-14 DIAGNOSIS — Z951 Presence of aortocoronary bypass graft: Secondary | ICD-10-CM | POA: Diagnosis not present

## 2020-03-14 DIAGNOSIS — I509 Heart failure, unspecified: Secondary | ICD-10-CM | POA: Diagnosis not present

## 2020-03-15 DIAGNOSIS — Z89619 Acquired absence of unspecified leg above knee: Secondary | ICD-10-CM | POA: Diagnosis not present

## 2020-03-15 DIAGNOSIS — I255 Ischemic cardiomyopathy: Secondary | ICD-10-CM | POA: Diagnosis not present

## 2020-03-15 DIAGNOSIS — Z951 Presence of aortocoronary bypass graft: Secondary | ICD-10-CM | POA: Diagnosis not present

## 2020-03-15 DIAGNOSIS — I509 Heart failure, unspecified: Secondary | ICD-10-CM | POA: Diagnosis not present

## 2020-06-17 DIAGNOSIS — R945 Abnormal results of liver function studies: Secondary | ICD-10-CM | POA: Diagnosis not present

## 2020-06-17 DIAGNOSIS — K5900 Constipation, unspecified: Secondary | ICD-10-CM | POA: Diagnosis not present

## 2020-06-17 DIAGNOSIS — R1030 Lower abdominal pain, unspecified: Secondary | ICD-10-CM | POA: Diagnosis not present

## 2020-06-17 DIAGNOSIS — I5043 Acute on chronic combined systolic (congestive) and diastolic (congestive) heart failure: Secondary | ICD-10-CM | POA: Diagnosis not present

## 2020-06-18 DIAGNOSIS — D649 Anemia, unspecified: Secondary | ICD-10-CM | POA: Diagnosis not present

## 2020-06-18 DIAGNOSIS — R109 Unspecified abdominal pain: Secondary | ICD-10-CM | POA: Diagnosis not present

## 2020-06-20 DIAGNOSIS — D649 Anemia, unspecified: Secondary | ICD-10-CM | POA: Diagnosis not present

## 2020-06-20 DIAGNOSIS — R945 Abnormal results of liver function studies: Secondary | ICD-10-CM | POA: Diagnosis not present

## 2020-06-20 DIAGNOSIS — Z950 Presence of cardiac pacemaker: Secondary | ICD-10-CM | POA: Diagnosis not present

## 2020-06-20 DIAGNOSIS — J811 Chronic pulmonary edema: Secondary | ICD-10-CM | POA: Diagnosis not present

## 2020-06-20 DIAGNOSIS — R109 Unspecified abdominal pain: Secondary | ICD-10-CM | POA: Diagnosis not present

## 2020-06-20 DIAGNOSIS — Z452 Encounter for adjustment and management of vascular access device: Secondary | ICD-10-CM | POA: Diagnosis not present

## 2020-06-21 DIAGNOSIS — R109 Unspecified abdominal pain: Secondary | ICD-10-CM | POA: Diagnosis not present

## 2020-06-21 DIAGNOSIS — R945 Abnormal results of liver function studies: Secondary | ICD-10-CM | POA: Diagnosis not present

## 2020-06-21 DIAGNOSIS — K5900 Constipation, unspecified: Secondary | ICD-10-CM | POA: Diagnosis not present

## 2020-06-22 DIAGNOSIS — J9621 Acute and chronic respiratory failure with hypoxia: Secondary | ICD-10-CM | POA: Diagnosis not present

## 2020-06-22 DIAGNOSIS — I5043 Acute on chronic combined systolic (congestive) and diastolic (congestive) heart failure: Secondary | ICD-10-CM | POA: Diagnosis not present

## 2020-06-22 DIAGNOSIS — I2510 Atherosclerotic heart disease of native coronary artery without angina pectoris: Secondary | ICD-10-CM | POA: Diagnosis not present

## 2020-06-23 DIAGNOSIS — I5043 Acute on chronic combined systolic (congestive) and diastolic (congestive) heart failure: Secondary | ICD-10-CM | POA: Diagnosis not present

## 2020-06-23 DIAGNOSIS — J9621 Acute and chronic respiratory failure with hypoxia: Secondary | ICD-10-CM | POA: Diagnosis not present

## 2020-06-23 DIAGNOSIS — I2510 Atherosclerotic heart disease of native coronary artery without angina pectoris: Secondary | ICD-10-CM | POA: Diagnosis not present

## 2020-06-24 DIAGNOSIS — K5900 Constipation, unspecified: Secondary | ICD-10-CM | POA: Diagnosis not present

## 2020-06-24 DIAGNOSIS — Z95810 Presence of automatic (implantable) cardiac defibrillator: Secondary | ICD-10-CM | POA: Diagnosis not present

## 2020-06-24 DIAGNOSIS — R109 Unspecified abdominal pain: Secondary | ICD-10-CM | POA: Diagnosis not present

## 2020-06-24 DIAGNOSIS — R0902 Hypoxemia: Secondary | ICD-10-CM | POA: Diagnosis not present

## 2020-06-24 DIAGNOSIS — I517 Cardiomegaly: Secondary | ICD-10-CM | POA: Diagnosis not present

## 2020-06-24 DIAGNOSIS — Z452 Encounter for adjustment and management of vascular access device: Secondary | ICD-10-CM | POA: Diagnosis not present

## 2020-06-24 DIAGNOSIS — R945 Abnormal results of liver function studies: Secondary | ICD-10-CM | POA: Diagnosis not present

## 2020-06-25 DIAGNOSIS — Z452 Encounter for adjustment and management of vascular access device: Secondary | ICD-10-CM | POA: Diagnosis not present

## 2020-06-25 DIAGNOSIS — R109 Unspecified abdominal pain: Secondary | ICD-10-CM | POA: Diagnosis not present

## 2020-06-25 DIAGNOSIS — R0989 Other specified symptoms and signs involving the circulatory and respiratory systems: Secondary | ICD-10-CM | POA: Diagnosis not present

## 2020-06-25 DIAGNOSIS — Z951 Presence of aortocoronary bypass graft: Secondary | ICD-10-CM | POA: Diagnosis not present

## 2020-06-25 DIAGNOSIS — R945 Abnormal results of liver function studies: Secondary | ICD-10-CM | POA: Diagnosis not present

## 2020-06-25 DIAGNOSIS — Z95810 Presence of automatic (implantable) cardiac defibrillator: Secondary | ICD-10-CM | POA: Diagnosis not present

## 2020-06-25 DIAGNOSIS — D649 Anemia, unspecified: Secondary | ICD-10-CM | POA: Diagnosis not present

## 2020-06-26 DIAGNOSIS — S81802A Unspecified open wound, left lower leg, initial encounter: Secondary | ICD-10-CM | POA: Diagnosis not present

## 2020-06-26 DIAGNOSIS — I5043 Acute on chronic combined systolic (congestive) and diastolic (congestive) heart failure: Secondary | ICD-10-CM | POA: Diagnosis not present

## 2020-06-27 DIAGNOSIS — Z89611 Acquired absence of right leg above knee: Secondary | ICD-10-CM | POA: Diagnosis not present

## 2020-06-28 DIAGNOSIS — N179 Acute kidney failure, unspecified: Secondary | ICD-10-CM | POA: Diagnosis not present

## 2020-06-28 DIAGNOSIS — Z743 Need for continuous supervision: Secondary | ICD-10-CM | POA: Diagnosis not present

## 2020-06-28 DIAGNOSIS — R778 Other specified abnormalities of plasma proteins: Secondary | ICD-10-CM | POA: Diagnosis not present

## 2020-06-28 DIAGNOSIS — M2550 Pain in unspecified joint: Secondary | ICD-10-CM | POA: Diagnosis not present

## 2020-06-28 DIAGNOSIS — R1084 Generalized abdominal pain: Secondary | ICD-10-CM | POA: Diagnosis not present

## 2020-06-28 DIAGNOSIS — J9621 Acute and chronic respiratory failure with hypoxia: Secondary | ICD-10-CM | POA: Diagnosis not present

## 2020-06-28 DIAGNOSIS — I5043 Acute on chronic combined systolic (congestive) and diastolic (congestive) heart failure: Secondary | ICD-10-CM | POA: Diagnosis not present

## 2020-06-28 DIAGNOSIS — R0602 Shortness of breath: Secondary | ICD-10-CM | POA: Diagnosis not present

## 2020-06-28 DIAGNOSIS — Z7401 Bed confinement status: Secondary | ICD-10-CM | POA: Diagnosis not present

## 2020-06-29 DIAGNOSIS — J449 Chronic obstructive pulmonary disease, unspecified: Secondary | ICD-10-CM | POA: Diagnosis not present

## 2020-06-29 DIAGNOSIS — E1151 Type 2 diabetes mellitus with diabetic peripheral angiopathy without gangrene: Secondary | ICD-10-CM | POA: Diagnosis not present

## 2020-06-29 DIAGNOSIS — I5043 Acute on chronic combined systolic (congestive) and diastolic (congestive) heart failure: Secondary | ICD-10-CM | POA: Diagnosis not present

## 2020-06-29 DIAGNOSIS — E1122 Type 2 diabetes mellitus with diabetic chronic kidney disease: Secondary | ICD-10-CM | POA: Diagnosis not present

## 2020-06-29 DIAGNOSIS — I130 Hypertensive heart and chronic kidney disease with heart failure and stage 1 through stage 4 chronic kidney disease, or unspecified chronic kidney disease: Secondary | ICD-10-CM | POA: Diagnosis not present

## 2020-06-29 DIAGNOSIS — I252 Old myocardial infarction: Secondary | ICD-10-CM | POA: Diagnosis not present

## 2020-06-29 DIAGNOSIS — E785 Hyperlipidemia, unspecified: Secondary | ICD-10-CM | POA: Diagnosis not present

## 2020-06-29 DIAGNOSIS — K760 Fatty (change of) liver, not elsewhere classified: Secondary | ICD-10-CM | POA: Diagnosis not present

## 2020-06-29 DIAGNOSIS — N179 Acute kidney failure, unspecified: Secondary | ICD-10-CM | POA: Diagnosis not present

## 2020-06-29 DIAGNOSIS — N1831 Chronic kidney disease, stage 3a: Secondary | ICD-10-CM | POA: Diagnosis not present

## 2020-06-29 DIAGNOSIS — R778 Other specified abnormalities of plasma proteins: Secondary | ICD-10-CM | POA: Diagnosis not present

## 2020-06-29 DIAGNOSIS — J9621 Acute and chronic respiratory failure with hypoxia: Secondary | ICD-10-CM | POA: Diagnosis not present

## 2020-06-29 DIAGNOSIS — E1140 Type 2 diabetes mellitus with diabetic neuropathy, unspecified: Secondary | ICD-10-CM | POA: Diagnosis not present

## 2020-06-30 DIAGNOSIS — I5023 Acute on chronic systolic (congestive) heart failure: Secondary | ICD-10-CM | POA: Diagnosis not present

## 2020-06-30 DIAGNOSIS — E871 Hypo-osmolality and hyponatremia: Secondary | ICD-10-CM | POA: Diagnosis not present

## 2020-06-30 DIAGNOSIS — Z4502 Encounter for adjustment and management of automatic implantable cardiac defibrillator: Secondary | ICD-10-CM | POA: Diagnosis not present

## 2020-06-30 DIAGNOSIS — R1084 Generalized abdominal pain: Secondary | ICD-10-CM | POA: Diagnosis not present

## 2020-06-30 DIAGNOSIS — Z20822 Contact with and (suspected) exposure to covid-19: Secondary | ICD-10-CM | POA: Diagnosis not present

## 2020-06-30 DIAGNOSIS — R778 Other specified abnormalities of plasma proteins: Secondary | ICD-10-CM | POA: Diagnosis not present

## 2020-06-30 DIAGNOSIS — R0602 Shortness of breath: Secondary | ICD-10-CM | POA: Diagnosis not present

## 2020-06-30 DIAGNOSIS — I509 Heart failure, unspecified: Secondary | ICD-10-CM | POA: Diagnosis not present

## 2020-06-30 DIAGNOSIS — R109 Unspecified abdominal pain: Secondary | ICD-10-CM | POA: Diagnosis not present

## 2020-06-30 DIAGNOSIS — I959 Hypotension, unspecified: Secondary | ICD-10-CM | POA: Diagnosis not present

## 2020-06-30 DIAGNOSIS — Z743 Need for continuous supervision: Secondary | ICD-10-CM | POA: Diagnosis not present

## 2020-06-30 DIAGNOSIS — I501 Left ventricular failure: Secondary | ICD-10-CM | POA: Diagnosis not present

## 2020-06-30 DIAGNOSIS — Z7901 Long term (current) use of anticoagulants: Secondary | ICD-10-CM | POA: Diagnosis not present

## 2020-06-30 DIAGNOSIS — I5084 End stage heart failure: Secondary | ICD-10-CM | POA: Diagnosis not present

## 2020-06-30 DIAGNOSIS — R52 Pain, unspecified: Secondary | ICD-10-CM | POA: Diagnosis not present

## 2020-06-30 DIAGNOSIS — K551 Chronic vascular disorders of intestine: Secondary | ICD-10-CM | POA: Diagnosis not present

## 2020-06-30 DIAGNOSIS — J811 Chronic pulmonary edema: Secondary | ICD-10-CM | POA: Diagnosis not present

## 2020-06-30 DIAGNOSIS — N179 Acute kidney failure, unspecified: Secondary | ICD-10-CM | POA: Diagnosis not present

## 2020-06-30 DIAGNOSIS — I272 Pulmonary hypertension, unspecified: Secondary | ICD-10-CM | POA: Diagnosis not present

## 2020-06-30 DIAGNOSIS — R918 Other nonspecific abnormal finding of lung field: Secondary | ICD-10-CM | POA: Diagnosis not present

## 2020-06-30 DIAGNOSIS — T402X5A Adverse effect of other opioids, initial encounter: Secondary | ICD-10-CM | POA: Diagnosis not present

## 2020-06-30 DIAGNOSIS — K761 Chronic passive congestion of liver: Secondary | ICD-10-CM | POA: Diagnosis not present

## 2020-06-30 DIAGNOSIS — R0902 Hypoxemia: Secondary | ICD-10-CM | POA: Diagnosis not present

## 2020-06-30 DIAGNOSIS — I5043 Acute on chronic combined systolic (congestive) and diastolic (congestive) heart failure: Secondary | ICD-10-CM | POA: Diagnosis not present

## 2020-06-30 DIAGNOSIS — K5903 Drug induced constipation: Secondary | ICD-10-CM | POA: Diagnosis not present

## 2020-06-30 DIAGNOSIS — Z515 Encounter for palliative care: Secondary | ICD-10-CM | POA: Diagnosis not present

## 2020-06-30 DIAGNOSIS — J9621 Acute and chronic respiratory failure with hypoxia: Secondary | ICD-10-CM | POA: Diagnosis not present

## 2020-06-30 DIAGNOSIS — I5A Non-ischemic myocardial injury (non-traumatic): Secondary | ICD-10-CM | POA: Diagnosis not present

## 2020-06-30 DIAGNOSIS — N189 Chronic kidney disease, unspecified: Secondary | ICD-10-CM | POA: Diagnosis not present

## 2020-06-30 DIAGNOSIS — Z66 Do not resuscitate: Secondary | ICD-10-CM | POA: Diagnosis not present

## 2020-06-30 DIAGNOSIS — I082 Rheumatic disorders of both aortic and tricuspid valves: Secondary | ICD-10-CM | POA: Diagnosis not present

## 2020-06-30 DIAGNOSIS — L97329 Non-pressure chronic ulcer of left ankle with unspecified severity: Secondary | ICD-10-CM | POA: Diagnosis not present

## 2020-07-01 DIAGNOSIS — N179 Acute kidney failure, unspecified: Secondary | ICD-10-CM | POA: Diagnosis not present

## 2020-07-01 DIAGNOSIS — R778 Other specified abnormalities of plasma proteins: Secondary | ICD-10-CM | POA: Diagnosis not present

## 2020-07-01 DIAGNOSIS — I5043 Acute on chronic combined systolic (congestive) and diastolic (congestive) heart failure: Secondary | ICD-10-CM | POA: Diagnosis not present

## 2020-07-01 DIAGNOSIS — J9621 Acute and chronic respiratory failure with hypoxia: Secondary | ICD-10-CM | POA: Diagnosis not present

## 2020-07-02 DIAGNOSIS — N179 Acute kidney failure, unspecified: Secondary | ICD-10-CM | POA: Diagnosis not present

## 2020-07-02 DIAGNOSIS — I5043 Acute on chronic combined systolic (congestive) and diastolic (congestive) heart failure: Secondary | ICD-10-CM | POA: Diagnosis not present

## 2020-07-02 DIAGNOSIS — J9621 Acute and chronic respiratory failure with hypoxia: Secondary | ICD-10-CM | POA: Diagnosis not present

## 2020-07-02 DIAGNOSIS — R778 Other specified abnormalities of plasma proteins: Secondary | ICD-10-CM | POA: Diagnosis not present

## 2020-07-03 DIAGNOSIS — N179 Acute kidney failure, unspecified: Secondary | ICD-10-CM | POA: Diagnosis not present

## 2020-07-03 DIAGNOSIS — J9621 Acute and chronic respiratory failure with hypoxia: Secondary | ICD-10-CM | POA: Diagnosis not present

## 2020-07-03 DIAGNOSIS — R778 Other specified abnormalities of plasma proteins: Secondary | ICD-10-CM | POA: Diagnosis not present

## 2020-07-03 DIAGNOSIS — I5043 Acute on chronic combined systolic (congestive) and diastolic (congestive) heart failure: Secondary | ICD-10-CM | POA: Diagnosis not present

## 2020-07-04 DIAGNOSIS — I132 Hypertensive heart and chronic kidney disease with heart failure and with stage 5 chronic kidney disease, or end stage renal disease: Secondary | ICD-10-CM | POA: Diagnosis not present

## 2020-07-04 DIAGNOSIS — J9691 Respiratory failure, unspecified with hypoxia: Secondary | ICD-10-CM | POA: Diagnosis not present

## 2020-07-04 DIAGNOSIS — I2699 Other pulmonary embolism without acute cor pulmonale: Secondary | ICD-10-CM | POA: Diagnosis not present

## 2020-07-04 DIAGNOSIS — J9621 Acute and chronic respiratory failure with hypoxia: Secondary | ICD-10-CM | POA: Diagnosis not present

## 2020-07-04 DIAGNOSIS — N179 Acute kidney failure, unspecified: Secondary | ICD-10-CM | POA: Diagnosis not present

## 2020-07-04 DIAGNOSIS — I5023 Acute on chronic systolic (congestive) heart failure: Secondary | ICD-10-CM | POA: Diagnosis not present

## 2020-07-04 DIAGNOSIS — I5043 Acute on chronic combined systolic (congestive) and diastolic (congestive) heart failure: Secondary | ICD-10-CM | POA: Diagnosis not present

## 2020-07-04 DIAGNOSIS — N183 Chronic kidney disease, stage 3 unspecified: Secondary | ICD-10-CM | POA: Diagnosis not present

## 2020-07-04 DIAGNOSIS — E1122 Type 2 diabetes mellitus with diabetic chronic kidney disease: Secondary | ICD-10-CM | POA: Diagnosis not present

## 2020-07-04 DIAGNOSIS — I5084 End stage heart failure: Secondary | ICD-10-CM | POA: Diagnosis not present

## 2020-07-04 DIAGNOSIS — R778 Other specified abnormalities of plasma proteins: Secondary | ICD-10-CM | POA: Diagnosis not present

## 2020-07-06 DIAGNOSIS — I509 Heart failure, unspecified: Secondary | ICD-10-CM | POA: Diagnosis not present

## 2020-07-07 DIAGNOSIS — I5043 Acute on chronic combined systolic (congestive) and diastolic (congestive) heart failure: Secondary | ICD-10-CM | POA: Diagnosis not present

## 2020-07-16 DEATH — deceased
